# Patient Record
Sex: Female | Born: 1945 | Race: White | Hispanic: No | Marital: Married | Smoking: Never smoker
Health system: Southern US, Community
[De-identification: ages and names within clinical notes are randomized; demographics above are authoritative.]

## PROBLEM LIST (undated history)

## (undated) DIAGNOSIS — J309 Allergic rhinitis, unspecified: Secondary | ICD-10-CM

## (undated) DIAGNOSIS — M5412 Radiculopathy, cervical region: Secondary | ICD-10-CM

## (undated) DIAGNOSIS — E785 Hyperlipidemia, unspecified: Secondary | ICD-10-CM

## (undated) DIAGNOSIS — K219 Gastro-esophageal reflux disease without esophagitis: Secondary | ICD-10-CM

## (undated) DIAGNOSIS — E119 Type 2 diabetes mellitus without complications: Secondary | ICD-10-CM

## (undated) HISTORY — PX: OTHER SURGICAL HISTORY: SHX169

## (undated) HISTORY — DX: Radiculopathy, cervical region: M54.12

## (undated) HISTORY — DX: Allergic rhinitis, unspecified: J30.9

## (undated) HISTORY — PX: TUBAL LIGATION: SHX77

## (undated) HISTORY — DX: Gastro-esophageal reflux disease without esophagitis: K21.9

---

## 1998-03-29 ENCOUNTER — Other Ambulatory Visit: Admission: RE | Admit: 1998-03-29 | Discharge: 1998-03-29 | Payer: Self-pay | Admitting: Obstetrics and Gynecology

## 1999-05-15 ENCOUNTER — Other Ambulatory Visit: Admission: RE | Admit: 1999-05-15 | Discharge: 1999-05-15 | Payer: Self-pay | Admitting: Obstetrics and Gynecology

## 2000-06-12 ENCOUNTER — Encounter: Payer: Self-pay | Admitting: Obstetrics and Gynecology

## 2000-06-12 ENCOUNTER — Encounter: Admission: RE | Admit: 2000-06-12 | Discharge: 2000-06-12 | Payer: Self-pay | Admitting: Obstetrics and Gynecology

## 2000-09-18 ENCOUNTER — Ambulatory Visit (HOSPITAL_COMMUNITY): Admission: RE | Admit: 2000-09-18 | Discharge: 2000-09-18 | Payer: Self-pay | Admitting: Gastroenterology

## 2000-10-28 ENCOUNTER — Encounter (INDEPENDENT_AMBULATORY_CARE_PROVIDER_SITE_OTHER): Payer: Self-pay

## 2000-10-28 ENCOUNTER — Ambulatory Visit (HOSPITAL_COMMUNITY): Admission: RE | Admit: 2000-10-28 | Discharge: 2000-10-28 | Payer: Self-pay | Admitting: Obstetrics and Gynecology

## 2001-08-09 ENCOUNTER — Other Ambulatory Visit: Admission: RE | Admit: 2001-08-09 | Discharge: 2001-08-09 | Payer: Self-pay | Admitting: Obstetrics and Gynecology

## 2001-08-13 ENCOUNTER — Encounter: Payer: Self-pay | Admitting: Obstetrics and Gynecology

## 2001-08-13 ENCOUNTER — Encounter: Admission: RE | Admit: 2001-08-13 | Discharge: 2001-08-13 | Payer: Self-pay | Admitting: Obstetrics and Gynecology

## 2002-05-27 ENCOUNTER — Encounter: Admission: RE | Admit: 2002-05-27 | Discharge: 2002-05-27 | Payer: Self-pay | Admitting: Sports Medicine

## 2003-03-03 ENCOUNTER — Encounter: Admission: RE | Admit: 2003-03-03 | Discharge: 2003-03-03 | Payer: Self-pay | Admitting: Obstetrics and Gynecology

## 2003-03-03 ENCOUNTER — Encounter: Payer: Self-pay | Admitting: Obstetrics and Gynecology

## 2003-05-23 ENCOUNTER — Other Ambulatory Visit: Admission: RE | Admit: 2003-05-23 | Discharge: 2003-05-23 | Payer: Self-pay | Admitting: Obstetrics and Gynecology

## 2003-11-16 ENCOUNTER — Ambulatory Visit (HOSPITAL_COMMUNITY): Admission: RE | Admit: 2003-11-16 | Discharge: 2003-11-16 | Payer: Self-pay | Admitting: Internal Medicine

## 2003-11-28 ENCOUNTER — Encounter: Admission: RE | Admit: 2003-11-28 | Discharge: 2004-02-26 | Payer: Self-pay | Admitting: Neurosurgery

## 2004-08-29 ENCOUNTER — Encounter: Admission: RE | Admit: 2004-08-29 | Discharge: 2004-08-29 | Payer: Self-pay | Admitting: Obstetrics and Gynecology

## 2005-09-02 ENCOUNTER — Encounter: Admission: RE | Admit: 2005-09-02 | Discharge: 2005-09-02 | Payer: Self-pay | Admitting: Obstetrics and Gynecology

## 2005-11-10 ENCOUNTER — Ambulatory Visit: Payer: Self-pay | Admitting: Internal Medicine

## 2006-07-23 ENCOUNTER — Ambulatory Visit: Payer: Self-pay | Admitting: Internal Medicine

## 2006-11-17 ENCOUNTER — Encounter: Admission: RE | Admit: 2006-11-17 | Discharge: 2006-11-17 | Payer: Self-pay | Admitting: Obstetrics and Gynecology

## 2007-04-30 ENCOUNTER — Ambulatory Visit: Payer: Self-pay | Admitting: Internal Medicine

## 2007-04-30 DIAGNOSIS — J069 Acute upper respiratory infection, unspecified: Secondary | ICD-10-CM | POA: Insufficient documentation

## 2007-05-24 ENCOUNTER — Telehealth: Payer: Self-pay | Admitting: Internal Medicine

## 2007-05-31 ENCOUNTER — Ambulatory Visit: Payer: Self-pay | Admitting: Internal Medicine

## 2007-05-31 DIAGNOSIS — J019 Acute sinusitis, unspecified: Secondary | ICD-10-CM

## 2007-05-31 DIAGNOSIS — J309 Allergic rhinitis, unspecified: Secondary | ICD-10-CM

## 2007-05-31 HISTORY — DX: Allergic rhinitis, unspecified: J30.9

## 2007-06-21 ENCOUNTER — Telehealth (INDEPENDENT_AMBULATORY_CARE_PROVIDER_SITE_OTHER): Payer: Self-pay | Admitting: *Deleted

## 2007-06-22 ENCOUNTER — Ambulatory Visit: Payer: Self-pay | Admitting: Internal Medicine

## 2007-06-24 ENCOUNTER — Telehealth: Payer: Self-pay | Admitting: Internal Medicine

## 2007-07-08 ENCOUNTER — Encounter: Payer: Self-pay | Admitting: Internal Medicine

## 2007-07-13 ENCOUNTER — Ambulatory Visit: Payer: Self-pay | Admitting: Internal Medicine

## 2007-07-13 DIAGNOSIS — K219 Gastro-esophageal reflux disease without esophagitis: Secondary | ICD-10-CM

## 2007-07-13 HISTORY — DX: Gastro-esophageal reflux disease without esophagitis: K21.9

## 2007-07-19 ENCOUNTER — Telehealth: Payer: Self-pay | Admitting: Internal Medicine

## 2007-08-13 ENCOUNTER — Encounter: Payer: Self-pay | Admitting: Internal Medicine

## 2007-12-23 ENCOUNTER — Encounter: Admission: RE | Admit: 2007-12-23 | Discharge: 2007-12-23 | Payer: Self-pay | Admitting: Obstetrics and Gynecology

## 2008-12-28 ENCOUNTER — Encounter: Admission: RE | Admit: 2008-12-28 | Discharge: 2008-12-28 | Payer: Self-pay | Admitting: Obstetrics and Gynecology

## 2009-07-31 ENCOUNTER — Ambulatory Visit: Payer: Self-pay | Admitting: Family Medicine

## 2010-01-07 ENCOUNTER — Ambulatory Visit: Payer: Self-pay | Admitting: Internal Medicine

## 2010-01-07 DIAGNOSIS — M5412 Radiculopathy, cervical region: Secondary | ICD-10-CM

## 2010-01-07 HISTORY — DX: Radiculopathy, cervical region: M54.12

## 2010-01-14 ENCOUNTER — Encounter: Admission: RE | Admit: 2010-01-14 | Discharge: 2010-01-14 | Payer: Self-pay | Admitting: Obstetrics and Gynecology

## 2010-05-31 ENCOUNTER — Telehealth: Payer: Self-pay | Admitting: Internal Medicine

## 2010-09-03 NOTE — Progress Notes (Signed)
Summary: cough/congestion  Phone Note Call from Patient Call back at Work Phone [phone]   Caller: Patient Call For: Gordy Savers  MD Summary of Call: Pt head congestion/cough no fever since [facility]day [phone]. Pt decline ov today. Initial call taken by: Heron Sabins,  May 31, 2010 2:48 PM  Follow-up for Phone Call        generic hydromet 6 oz one tsp every 6 hours for cough Follow-up by: Gordy Savers  MD,  May 31, 2010 3:44 PM  Additional Follow-up for Phone Call Additional follow up Details #1::        Phone Call Completed, Rx Called In Additional Follow-up by: Alfred Levins, CMA,  May 31, 2010 4:22 PM

## 2010-09-03 NOTE — Assessment & Plan Note (Signed)
Summary: NECK PAIN (CONCERNED ABOUT HERNIATED DISC) // RS   Vital Signs:  Patient profile:   65 year old female Weight:      175 pounds Temp:     97.9 degrees F oral BP sitting:   108 / 70  (left arm) Cuff size:   regular  Vitals Entered By: Duard Brady LPN (January 08, 2955 9:52 AM) CC: c/o (R) neck and shoulder pain since weekend   no fall no injury Is Patient Diabetic? No   CC:  c/o (R) neck and shoulder pain since weekend   no fall no injury.  History of Present Illness: 65 year old patient who has a prior history of a right C7 cervical radiculopathy.  Detailed medical records are unavailable, but she was too with a single epidural at [facility] and has done well over the past 7 years;  for the past several days.  She has had increasing right neck, shoulder discomfort.  She feels this is identical to her symptoms in the past.  She has already  self-referred to a [facility] neurologist, and would like to consider epidural injections at this time.  There is been no motor weakness  Preventive Screening-Counseling & Management  Alcohol-Tobacco     Smoking Status: never  Allergies: 1)  ! Erythromycin (Erythromycin)  Past History:  Past Medical History: Reviewed history from 05/31/2007 and no changes required. Allergic rhinitis history acute right C7 cervical radiculopathy gravida two, para two,   Social History: Smoking Status:  never  Physical Exam  General:  Well-developed,well-nourished,in no acute distress; alert,appropriate and cooperative throughout examination Eyes:  No corneal or conjunctival inflammation noted. EOMI. Perrla. Funduscopic exam benign, without hemorrhages, exudates or papilledema. Vision grossly normal. Neck:  full range of motion of the neck and head;  turning to the right did aggravate her right shoulder and neck discomfort Neurologic:  alert & oriented X3, cranial nerves II-XII intact, and strength normal in all extremities.   biceps and  triceps reflexes were brisk and equal no motor weakness   Impression & Recommendations:  Problem # 1:  CERVICAL RADICULOPATHY, RIGHT (ICD-723.4)  Complete Medication List: 1)  Omeprazole 40 Mg Cpdr (Omeprazole) .Marland Kitchen.. 1 two times a day 2)  Mupirocin 2 % Oint (Mupirocin) .... Apply to nose as needed  Patient Instructions: 1)  follow-up [facility] neurology   This afternoon as scheduled 2)  call if unimproved in   Medication Administration  Injection # 1:    Medication: Depo- Medrol 80mg   Orders Added: 1)  Est. Patient Level III [21308]   Appended Document: NECK PAIN (CONCERNED ABOUT HERNIATED DISC) // RS     Allergies: 1)  ! Erythromycin (Erythromycin)   Complete Medication List: 1)  Omeprazole 40 Mg Cpdr (Omeprazole) .Marland Kitchen.. 1 two times a day 2)  Mupirocin 2 % Oint (Mupirocin) .... Apply to nose as needed  Other Orders: Depo- Medrol 80mg  (J1040) Admin of Therapeutic Inj  intramuscular or subcutaneous (65784)    Medication Administration  Injection # 1:    Medication: Depo- Medrol 80mg     Diagnosis: CERVICAL RADICULOPATHY, RIGHT (ICD-723.4)    Route: IM    Site: R deltoid    Exp Date: 06/2012    Lot #: [id number]    Mfr: Pharmacia    Patient tolerated injection without complications    Given by: Duard Brady LPN (January 07, 6961 2:39 PM)  Orders Added: 1)  Depo- Medrol 80mg  [J1040] 2)  Admin of Therapeutic Inj  intramuscular or  subcutaneous [id number]

## 2010-10-17 ENCOUNTER — Encounter: Payer: Self-pay | Admitting: Internal Medicine

## 2010-10-17 ENCOUNTER — Telehealth: Payer: Self-pay | Admitting: Internal Medicine

## 2010-10-17 NOTE — Telephone Encounter (Signed)
Pt called req work in appt for tomorrow 10/18/10, anytime, re: chest cold and sorethroat. Pt leaving to go to [place] this wkend. Pls advise.

## 2010-10-17 NOTE — Telephone Encounter (Signed)
Please make appt - I see 2 open appt tomorrow afternoon.

## 2010-10-17 NOTE — Telephone Encounter (Addendum)
Pt will come in around 230pm on 3-16

## 2010-10-18 ENCOUNTER — Ambulatory Visit (INDEPENDENT_AMBULATORY_CARE_PROVIDER_SITE_OTHER): Payer: BC Managed Care – PPO | Admitting: Internal Medicine

## 2010-10-18 ENCOUNTER — Encounter: Payer: Self-pay | Admitting: Internal Medicine

## 2010-10-18 DIAGNOSIS — J029 Acute pharyngitis, unspecified: Secondary | ICD-10-CM

## 2010-10-18 LAB — POCT RAPID STREP A (OFFICE): Rapid Strep A Screen: NEGATIVE

## 2010-10-18 NOTE — Patient Instructions (Signed)
Get plenty of rest, Drink lots of  clear liquids, and use Tylenol or ibuprofen for fever and discomfort.    Call or return to clinic prn if these symptoms worsen or fail to improve as anticipated.  

## 2010-10-18 NOTE — Progress Notes (Signed)
  Subjective:    Patient ID: Kathy Herman, female    DOB: 11/28/1945, 65 y.o.   MRN: [id number]  HPI   65 year old patient who presents with a three-day history of nasal congestion cough and sore throat. She'll be leaving the country in 2 days and was concerned about a possible strep. She also has had some mild hoarseness. A rapid strep screen negative. She was afebrile today.    Review of Systems  Constitutional: Positive for fever.  HENT: Negative for hearing loss, congestion, sore throat, rhinorrhea, dental problem, sinus pressure and tinnitus.   Eyes: Negative for pain, discharge and visual disturbance.  Respiratory: Positive for cough. Negative for shortness of breath.   Cardiovascular: Negative for chest pain, palpitations and leg swelling.  Gastrointestinal: Negative for nausea, vomiting, abdominal pain, diarrhea, constipation, blood in stool and abdominal distention.  Genitourinary: Negative for dysuria, urgency, frequency, hematuria, flank pain, vaginal bleeding, vaginal discharge, difficulty urinating, vaginal pain and pelvic pain.  Musculoskeletal: Negative for joint swelling, arthralgias and gait problem.  Skin: Negative for rash.  Neurological: Negative for dizziness, syncope, speech difficulty, weakness, numbness and headaches.  Hematological: Negative for adenopathy.  Psychiatric/Behavioral: Negative for behavioral problems, dysphoric mood and agitation. The patient is not nervous/anxious.        Objective:   Physical Exam  Constitutional: She is oriented to person, place, and time. She appears well-developed and well-nourished.  HENT:  Head: Normocephalic.  Right Ear: External ear normal.  Left Ear: External ear normal.        Mild erythema of the oropharynx  Eyes: Conjunctivae and EOM are normal. Pupils are equal, round, and reactive to light.  Neck: Normal range of motion. Neck supple. No thyromegaly present.  Cardiovascular: Normal rate, regular rhythm, normal  heart sounds and intact distal pulses.   Pulmonary/Chest: Effort normal and breath sounds normal.  Abdominal: Soft. Bowel sounds are normal. She exhibits no mass. There is no tenderness.  Musculoskeletal: Normal range of motion.  Lymphadenopathy:    She has no cervical adenopathy.  Neurological: She is alert and oriented to person, place, and time.  Skin: Skin is warm and dry. No rash noted.  Psychiatric: She has a normal mood and affect. Her behavior is normal.          Assessment & Plan:   viral URI with pharyngitis. We'll treat symptomatically.

## 2010-11-01 ENCOUNTER — Encounter: Payer: Self-pay | Admitting: Internal Medicine

## 2010-11-01 ENCOUNTER — Ambulatory Visit (INDEPENDENT_AMBULATORY_CARE_PROVIDER_SITE_OTHER): Payer: BC Managed Care – PPO | Admitting: Internal Medicine

## 2010-11-01 VITALS — Temp 98.1°F | Wt 176.0 lb

## 2010-11-01 DIAGNOSIS — K219 Gastro-esophageal reflux disease without esophagitis: Secondary | ICD-10-CM

## 2010-11-01 DIAGNOSIS — H109 Unspecified conjunctivitis: Secondary | ICD-10-CM

## 2010-11-01 MED ORDER — NEOMYCIN-POLYMYXIN-HC 3.5-10000-1 OP SUSP: 2.0000 [drp] | Freq: Four times a day (QID) | OPHTHALMIC | Status: AC

## 2010-11-01 NOTE — Patient Instructions (Signed)
Ophthalmic drops as discussed  Call or return to clinic prn if these symptoms worsen or fail to improve as anticipated.

## 2010-11-01 NOTE — Progress Notes (Signed)
  Subjective:    Patient ID: Kathy Herman, female    DOB: 11/04/1945, 65 y.o.   MRN: [id number]  HPI 65 year old patient who presents with a seven-day history of red itchy eyes. She has considerable matting of the eyelids when she awakes in the morning. No visual loss or eye pain. She has returned from a trip to [place] where she has suffered for most of the trip with significant URI symptoms. These have largely resolved. She has a history of gastroesophageal reflux disease and has recently had reevaluation at [facility]    Review of Systems  Constitutional: Positive for fatigue. Negative for fever.  HENT: Negative for hearing loss, congestion, sore throat, rhinorrhea, dental problem, sinus pressure and tinnitus.   Eyes: Positive for discharge, redness and itching. Negative for pain and visual disturbance.  Respiratory: Negative for cough and shortness of breath.   Cardiovascular: Negative for chest pain, palpitations and leg swelling.  Gastrointestinal: Negative for nausea, vomiting, abdominal pain, diarrhea, constipation, blood in stool and abdominal distention.  Genitourinary: Negative for dysuria, urgency, frequency, hematuria, flank pain, vaginal bleeding, vaginal discharge, difficulty urinating, vaginal pain and pelvic pain.  Musculoskeletal: Negative for joint swelling, arthralgias and gait problem.  Skin: Negative for rash.  Neurological: Negative for dizziness, syncope, speech difficulty, weakness, numbness and headaches.  Hematological: Negative for adenopathy.  Psychiatric/Behavioral: Negative for behavioral problems, dysphoric mood and agitation. The patient is not nervous/anxious.        Objective:   Physical Exam  Constitutional: She appears well-developed and well-nourished. No distress.  HENT:  Head: Normocephalic and atraumatic.  Mouth/Throat: Oropharynx is clear and moist.  Eyes: EOM are normal. Pupils are equal, round, and reactive to light. Right eye exhibits  no discharge.       Both eyes revealed mild conjunctival injection. Lids were normal no exudate noted  Neck: Normal range of motion. Neck supple.  Cardiovascular: Normal rate and regular rhythm.   Pulmonary/Chest: Effort normal and breath sounds normal.          Assessment & Plan:  Conjunctivitis. We'll treat with Cortisporin Ophthalmic solution. She will call or she fails to improve promptly

## 2010-12-20 NOTE — Assessment & Plan Note (Signed)
[facility]                                 ON-CALL NOTE   Kathy Herman, Kathy Herman                       MRN:          [id number]  DATE:07/17/2007                            DOB:          June 23, 1946    A patient of Dr. Amador Cunas.   The patient calling because she thinks she has a sinus infection.  Offered Saturday clinic eval.  Patient declines, wants to see Dr.  Amador Cunas on Monday.     Jeffrey A. Tawanna Cooler, MD  Electronically Signed    JAT/MedQ  DD: 07/17/2007  DT: 07/19/2007  Job #: [id number]

## 2010-12-20 NOTE — Procedures (Signed)
[facility]  Patient:    [name], Kathy Herman                     MRN: [id number] Proc. Date: 09/18/00 Adm. Date:  [id number] Attending:  Charna Elizabeth CC:         Eliberto Ivory. Rosalio Macadamia, M.D., [facility]   Procedure Report  DATE OF BIRTH:  09/30/1945.  PROCEDURE:  Colonoscopy.  ENDOSCOPIST:  Anselmo Rod, M.D.  INSTRUMENT USED:  Olympus video colonoscope.  INDICATION FOR PROCEDURE:  Rectal bleeding in a 65 year old white female. Rule out colonic polyps, masses, hemorrhoids, etc.  PREPROCEDURE PREPARATION:  Informed consent was procured from the patient. The patient was fasted for eight hours prior to the procedure and prepped with a bottle of magnesium citrate and a gallon of NuLytely the night prior to the procedure.  PREPROCEDURE PHYSICAL:  VITAL SIGNS:  The patient had stable vital signs.  NECK:  Supple.  CHEST:  Clear to auscultation.  S1, S2 regular.  ABDOMEN:  Soft with normal abdominal bowel sounds.  DESCRIPTION OF PROCEDURE:  The patient was placed in the left lateral decubitus position and sedated with 70 mg of Demerol and 7 mg of Versed intravenously.  Once the patient was adequately sedate and maintained on low-flow oxygen and continuous cardiac monitoring, the Olympus video colonoscope was advanced from the rectum to the cecum without difficulty. There were a few scattered diverticula seen throughout the colon.  The patient had a small external hemorrhoid and small, nonbleeding internal hemorrhoid. No masses or polyps were seen.  IMPRESSION: 1. Scattered diverticulosis. 2. Small internal and external hemorrhoid. 3. No large masses or polyps seen.  RECOMMENDATIONS: 1. Proceed with gynecological evaluation as planned by Dr. Floyde Parkins. 2. Increase the fluid and fiber in the diet. 3. Outpatient follow-up on a p.r.n. basis. DD:  09/18/00 TD:  09/19/00 Job: [id number]

## 2010-12-20 NOTE — Op Note (Signed)
[facility]  Patient:    Kathy Herman, Kathy Herman                     MRN: [id number] Proc. Date: 10/28/00 Adm. Date:  [id number] Attending:  Morene Antu                           Operative Report  PREOPERATIVE DIAGNOSES:       Postmenopausal bleeding, with thickened endometrium.  POSTOPERATIVE DIAGNOSES:      Postmenopausal bleeding, with thickened endometrium.  PROCEDURE:                    D&C hysteroscopy, with resectoscopic excision of polypoid and thickened tissue.  SURGEON:                      Sherry A. Rosalio Macadamia, M.D.  ANESTHESIA:                   MAC.  INDICATIONS:                  This is a 65 year old G3, P2-0-1-2 woman who has had postmenopausal bleeding.  The patient had been treated with Ortho-Est for postmenopausal symptoms.  Endometrial biopsy had been attempted in the office, but the cervical os was stenotic.  Ultrasound was performed showing thickened endometrium, approximately 1.6 cm.  Because of this, The patient is brought to the operating room for [facility] hysteroscopy with resectoscope.  FINDINGS:                     Very thickened, polypoid tissue.  Normal-sized anteflexed uterus.  No adnexal mass.  DESCRIPTION OF PROCEDURE:     The patient is brought into the operating room and given adequate IV sedation.  She was placed in the dorsal lithotomy position.  Her perineum was washed with Betadine.  Pelvic examination was performed.  The bladder was in and out catheterized.  The surgeons gown was changed.  The patient was draped in a sterile fashion.  A speculum was placed within the vagina.  The vagina was washed with Betadine.  Paracervical block was administered with 1% Nesacaine.  The anterior lip of the cervix was grasped with a single-tooth tenaculum.  The cervix was sounded.  The cervix was dilated with Pratt dilators to a #31.  Hysteroscope was then introduced into the endometrial cavity without difficulty and  pictures were obtained. Using a double-looped right angle resector, the endometrial tissue was resected in sheets circumferentially.  There were small bleeders present that were cauterized.  Adequate hemostasis was obtained.  Pictures were obtained. All instruments were removed from the vagina.  The patient was taken out of the dorsal lithotomy position.  She was awakened, removed from the operating table to a stretcher in stable condition.  COMPLICATIONS:                None.  ESTIMATED BLOOD LOSS:         Less than 5 cc.  Sorbitol differential -80. DD:  10/28/00 TD:  10/28/00 Job: [id number]

## 2011-01-06 ENCOUNTER — Encounter: Payer: Self-pay | Admitting: Internal Medicine

## 2011-03-03 ENCOUNTER — Other Ambulatory Visit: Payer: Self-pay | Admitting: Internal Medicine

## 2011-03-03 DIAGNOSIS — Z1231 Encounter for screening mammogram for malignant neoplasm of breast: Secondary | ICD-10-CM

## 2011-03-31 ENCOUNTER — Ambulatory Visit
Admission: RE | Admit: 2011-03-31 | Discharge: 2011-03-31 | Disposition: A | Discharge status: Home / Self Care | Payer: BC Managed Care – PPO | Source: Ambulatory Visit | Attending: Internal Medicine | Admitting: Internal Medicine

## 2011-03-31 DIAGNOSIS — Z1231 Encounter for screening mammogram for malignant neoplasm of breast: Secondary | ICD-10-CM

## 2011-07-22 ENCOUNTER — Ambulatory Visit (INDEPENDENT_AMBULATORY_CARE_PROVIDER_SITE_OTHER): Payer: BC Managed Care – PPO | Admitting: Internal Medicine

## 2011-07-22 ENCOUNTER — Encounter: Payer: Self-pay | Admitting: Internal Medicine

## 2011-07-22 VITALS — BP 120/74 | Temp 97.0°F | Wt 179.0 lb

## 2011-07-22 DIAGNOSIS — M542 Cervicalgia: Secondary | ICD-10-CM

## 2011-07-22 DIAGNOSIS — M5412 Radiculopathy, cervical region: Secondary | ICD-10-CM

## 2011-07-22 MED ORDER — TRAMADOL HCL 50 MG PO TABS: 50.0000 mg | ORAL_TABLET | Freq: Three times a day (TID) | ORAL | Status: AC | PRN

## 2011-07-22 MED ORDER — CYCLOBENZAPRINE HCL 10 MG PO TABS: 10.0000 mg | ORAL_TABLET | Freq: Three times a day (TID) | ORAL | Status: AC | PRN

## 2011-07-22 NOTE — Patient Instructions (Signed)
You  may move around, but avoid painful motions and activities.  Apply  Heat  to the sore area for 15 to 20 minutes 3 or 4 times daily for the next two to 3 days.  Take 400-600 mg of ibuprofen ( Advil, Motrin) with food every 4 to 6 hours as needed for pain relief or control of fever  Call or return to clinic prn if these symptoms worsen or fail to improve as anticipated.

## 2011-07-22 NOTE — Progress Notes (Signed)
  Subjective:    Patient ID: Kathy Herman, female    DOB: Jun 12, 1946, 65 y.o.   MRN: [id number]  HPI 65 year old patient who has a history of a prior right cervical radiculopathy in 2008. For the past 3 days she has had left posterior neck pain. She has had no radicular symptoms. Pain is aggravated by head movement to the left.   Review of Systems     Objective:   Physical Exam  Constitutional: She appears well-developed and well-nourished. No distress.  Neck:       The right posterior neck musculature tight and tense. Pain is aggravated by head movement to the left          Assessment & Plan:   Musculoligamentous neck pain Cervical disc disease  We'll treat with Depo-Medrol 80 mg IM we'll treat with analgesics anti-inflammatories and muscle relaxers. Will  rest and apply warm compresses

## 2011-08-18 ENCOUNTER — Telehealth: Payer: Self-pay | Admitting: *Deleted

## 2011-08-18 DIAGNOSIS — M542 Cervicalgia: Secondary | ICD-10-CM

## 2011-08-18 NOTE — Telephone Encounter (Signed)
LMTCB

## 2011-08-18 NOTE — Telephone Encounter (Signed)
Please call pt for dates she is available for this CT.  [phone]   CERVICAL CT

## 2011-08-18 NOTE — Telephone Encounter (Signed)
Pt's neck and back pain is getting increasingly worse, and is asking if she should see Dr. Kirtland Bouchard or get a referral to an Orthopedist.

## 2011-08-18 NOTE — Telephone Encounter (Signed)
Schedule cervical MRI

## 2011-08-19 ENCOUNTER — Other Ambulatory Visit: Payer: Self-pay | Admitting: *Deleted

## 2011-08-19 DIAGNOSIS — M542 Cervicalgia: Secondary | ICD-10-CM

## 2011-08-21 ENCOUNTER — Ambulatory Visit
Admission: RE | Admit: 2011-08-21 | Discharge: 2011-08-21 | Disposition: A | Discharge status: Home / Self Care | Payer: BC Managed Care – PPO | Source: Ambulatory Visit | Attending: Internal Medicine | Admitting: Internal Medicine

## 2011-08-21 DIAGNOSIS — M542 Cervicalgia: Secondary | ICD-10-CM

## 2011-08-22 ENCOUNTER — Other Ambulatory Visit: Payer: Self-pay

## 2011-08-22 ENCOUNTER — Encounter: Payer: Self-pay | Admitting: Internal Medicine

## 2011-08-22 ENCOUNTER — Other Ambulatory Visit: Payer: Self-pay | Admitting: Internal Medicine

## 2011-08-22 ENCOUNTER — Telehealth: Payer: Self-pay

## 2011-08-22 MED ORDER — PREDNISONE (PAK) 5 MG PO TABS: 5.0000 mg | ORAL_TABLET | Freq: Every day | ORAL | Status: DC

## 2011-08-22 NOTE — Progress Notes (Signed)
Quick Note:  Called pt- VM - LMTCB , I need pharmacy to call out prednisone to. ______

## 2011-08-22 NOTE — Progress Notes (Signed)
Quick Note:  [facility]

## 2011-08-22 NOTE — Telephone Encounter (Signed)
Attempt to call- VM - on cell # - LMTCB need pharmacy to be able to call out prednisone. Please call back and give this info.

## 2011-08-27 ENCOUNTER — Telehealth: Payer: Self-pay | Admitting: *Deleted

## 2011-08-27 NOTE — Telephone Encounter (Signed)
Called dr' lauve's office and this is a follow up with this md for neck issues-----fyi--holly faxed mri

## 2011-08-27 NOTE — Telephone Encounter (Signed)
Received call from dr lauve's office in [place], Neurologist-- states they need mri  -pt has ov with them tomorrow, Thursday-  I cant find any reference to a referral for this pt. This is neurologist , not nuerosurgeon.  Is it ok to fax mri and last ov?? If so fax number is [phone]

## 2011-10-08 ENCOUNTER — Other Ambulatory Visit: Payer: Self-pay | Admitting: Obstetrics and Gynecology

## 2011-10-08 DIAGNOSIS — M949 Disorder of cartilage, unspecified: Secondary | ICD-10-CM

## 2011-10-20 ENCOUNTER — Ambulatory Visit
Admission: RE | Admit: 2011-10-20 | Discharge: 2011-10-20 | Disposition: A | Discharge status: Home / Self Care | Payer: BC Managed Care – PPO | Source: Ambulatory Visit | Attending: Obstetrics and Gynecology | Admitting: Obstetrics and Gynecology

## 2011-10-20 DIAGNOSIS — M899 Disorder of bone, unspecified: Secondary | ICD-10-CM

## 2012-06-23 ENCOUNTER — Other Ambulatory Visit: Payer: Self-pay | Admitting: Internal Medicine

## 2012-06-23 DIAGNOSIS — Z1231 Encounter for screening mammogram for malignant neoplasm of breast: Secondary | ICD-10-CM

## 2012-07-15 ENCOUNTER — Ambulatory Visit
Admission: RE | Admit: 2012-07-15 | Discharge: 2012-07-15 | Disposition: A | Discharge status: Home / Self Care | Payer: BC Managed Care – PPO | Source: Ambulatory Visit | Attending: Internal Medicine | Admitting: Internal Medicine

## 2012-07-15 DIAGNOSIS — Z1231 Encounter for screening mammogram for malignant neoplasm of breast: Secondary | ICD-10-CM

## 2012-08-12 ENCOUNTER — Other Ambulatory Visit: Payer: Self-pay | Admitting: Internal Medicine

## 2012-08-12 ENCOUNTER — Ambulatory Visit: Admission: RE | Admit: 2012-08-12 | Payer: BC Managed Care – PPO | Source: Ambulatory Visit

## 2012-08-12 ENCOUNTER — Ambulatory Visit
Admission: RE | Admit: 2012-08-12 | Discharge: 2012-08-12 | Disposition: A | Discharge status: Home / Self Care | Payer: BC Managed Care – PPO | Source: Ambulatory Visit

## 2012-08-12 DIAGNOSIS — Z1231 Encounter for screening mammogram for malignant neoplasm of breast: Secondary | ICD-10-CM

## 2012-11-19 ENCOUNTER — Emergency Department (HOSPITAL_COMMUNITY): Payer: BC Managed Care – PPO

## 2012-11-19 ENCOUNTER — Emergency Department (HOSPITAL_COMMUNITY)
Admission: EM | Admit: 2012-11-19 | Discharge: 2012-11-19 | Disposition: A | Discharge status: Home / Self Care | Payer: BC Managed Care – PPO | Attending: Emergency Medicine | Admitting: Emergency Medicine

## 2012-11-19 ENCOUNTER — Encounter (HOSPITAL_COMMUNITY): Payer: Self-pay | Admitting: Emergency Medicine

## 2012-11-19 DIAGNOSIS — Z8739 Personal history of other diseases of the musculoskeletal system and connective tissue: Secondary | ICD-10-CM | POA: Insufficient documentation

## 2012-11-19 DIAGNOSIS — S63279A Dislocation of unspecified interphalangeal joint of unspecified finger, initial encounter: Secondary | ICD-10-CM | POA: Insufficient documentation

## 2012-11-19 DIAGNOSIS — Z8709 Personal history of other diseases of the respiratory system: Secondary | ICD-10-CM | POA: Insufficient documentation

## 2012-11-19 DIAGNOSIS — Y9301 Activity, walking, marching and hiking: Secondary | ICD-10-CM | POA: Insufficient documentation

## 2012-11-19 DIAGNOSIS — W108XXA Fall (on) (from) other stairs and steps, initial encounter: Secondary | ICD-10-CM | POA: Insufficient documentation

## 2012-11-19 DIAGNOSIS — K219 Gastro-esophageal reflux disease without esophagitis: Secondary | ICD-10-CM | POA: Insufficient documentation

## 2012-11-19 DIAGNOSIS — Z79899 Other long term (current) drug therapy: Secondary | ICD-10-CM | POA: Insufficient documentation

## 2012-11-19 DIAGNOSIS — IMO0002 Reserved for concepts with insufficient information to code with codable children: Secondary | ICD-10-CM | POA: Insufficient documentation

## 2012-11-19 DIAGNOSIS — S63259A Unspecified dislocation of unspecified finger, initial encounter: Secondary | ICD-10-CM

## 2012-11-19 DIAGNOSIS — Y92009 Unspecified place in unspecified non-institutional (private) residence as the place of occurrence of the external cause: Secondary | ICD-10-CM | POA: Insufficient documentation

## 2012-11-19 DIAGNOSIS — W010XXA Fall on same level from slipping, tripping and stumbling without subsequent striking against object, initial encounter: Secondary | ICD-10-CM | POA: Insufficient documentation

## 2012-11-19 MED ORDER — HYDROCODONE-ACETAMINOPHEN 5-325 MG PO TABS: 1.0000 | ORAL_TABLET | ORAL | Status: DC | PRN

## 2012-11-19 MED ORDER — LIDOCAINE HCL 2 % IJ SOLN
INTRAMUSCULAR | Status: AC
  Filled 2012-11-19: qty 20

## 2012-11-19 NOTE — ED Notes (Signed)
Patient walking up stairs fell and caught herself with her right hand injury 5th finger.  Obvious deformity to finger.  Bruising present pt reports 5/10 pain level.  EMS gave patient Fentanyl in route.  Pain also reporting pain in right rib area.  No bruising noted.

## 2012-11-19 NOTE — ED Provider Notes (Signed)
History     CSN: [id number]  Arrival date & time 11/19/12  1630   First MD Initiated Contact with Patient 11/19/12 1651      Chief Complaint  Patient presents with  . Finger Injury    (Consider location/radiation/quality/duration/timing/severity/associated sxs/prior treatment) HPI Patient tripped and fell walking up the steps in her home one hour ago injuring her right fifth finger. She also bumped her right ribs as a result fall. No other injury pain is worse with movement a finger or with palpation. Pain at ribs is nonradiating lateral aspect worse with pressing on the area no shortness of breath no other associated symptoms no numbness no other injury EMS treated patient with fentanyl 50 mg IV with partial relief of pain. Past Medical History  Diagnosis Date  . Allergic rhinitis, cause unspecified 05/31/2007  . CERVICAL RADICULOPATHY, RIGHT 01/07/2010  . GERD 07/13/2007    Past Surgical History  Procedure Laterality Date  . Tubal ligation      No family history on file.  History  Substance Use Topics  . Smoking status: Never Smoker   . Smokeless tobacco: Never Used  . Alcohol Use: Not on file    OB History   Grav Para Term Preterm Abortions TAB SAB Ect Mult Living                  Review of Systems  Constitutional: Negative.   HENT: Negative.   Respiratory: Negative.        Right rib pain  Cardiovascular: Negative.   Gastrointestinal: Negative.   Musculoskeletal: Positive for arthralgias.       Injury right fifth finger  Skin: Negative.   Neurological: Negative.   Psychiatric/Behavioral: Negative.     Allergies  Erythromycin  Home Medications   Current Outpatient Rx  Name  Route  Sig  Dispense  Refill  . esomeprazole (NEXIUM) 40 MG capsule   Oral   Take 40 mg by mouth 2 (two) times daily.           . predniSONE (STERAPRED UNI-PAK) 5 MG TABS   Oral   Take 1 tablet (5 mg total) by mouth daily.   12 tablet   0   . ranitidine (ZANTAC) 150 MG  tablet   Oral   Take 300 mg by mouth at bedtime.           . valACYclovir (VALTREX) 1000 MG tablet                 BP 109/65  Pulse 68  Temp(Src) 97.9 F (36.6 C) (Oral)  Resp 16  SpO2 94%  Physical Exam  Nursing note and vitals reviewed. Constitutional: She appears well-developed and well-nourished.  HENT:  Head: Normocephalic and atraumatic.  Eyes: Conjunctivae are normal. Pupils are equal, round, and reactive to light.  Neck: Neck supple. No tracheal deviation present. No thyromegaly present.  Cardiovascular: Normal rate and regular rhythm.   No murmur heard. Pulmonary/Chest: Effort normal and breath sounds normal. She exhibits tenderness.  Minimally tender at right lateral upper rib cage no crepitance no flail  Abdominal: Soft. Bowel sounds are normal. She exhibits no distension. There is no tenderness.  Musculoskeletal: Normal range of motion. She exhibits no edema and no tenderness.  Right upper extremity skin intact no obvious deformity at the fifth finger at PIP joint. Sensation intact to light touch. Good capillary refill Otherwise atraumatic. All other extremities the contusion abrasion or tenderness neurovascularly intact  Neurological: She is alert. Coordination  normal.  Skin: Skin is warm and dry. No rash noted.  Psychiatric: She has a normal mood and affect.    ED Course  Procedures (including critical care time)  Labs Reviewed - No data to display Dg Hand Complete Right  11/19/2012  *RADIOLOGY REPORT*  Clinical Data:  Right hand pain post fall, pain greatest at little finger  RIGHT HAND - COMPLETE 3+ VIEW  Comparison: None  Findings: Mild osseous demineralization. PIP dislocation right little finger, distal phalanx dislocated ulnar and dorsal. No definite fracture identified. No additional acute bony abnormalities identified.  IMPRESSION: PIP dislocation right little finger.   Original Report Authenticated By: Ulyses Southward, M.D.    Dg Finger Little  Right  11/19/2012  *RADIOLOGY REPORT*  Clinical Data:  Right little finger pain post fall  RIGHT LITTLE FINGER 2+V  Comparison: None  Findings: PIP dislocation right little finger, middle phalanx dislocated ulnar and slightly dorsal. No fracture, additional dislocation or bone destruction. Bones appear diffusely demineralized.  IMPRESSION: PIP dislocation right little finger.   Original Report Authenticated By: Ulyses Southward, M.D.      No diagnosis found.  Procedure 6 PM right fifth finger dislocation was reduced by me. Timeout was performed . A digital block was performed with 2% lidocaine finger was reduced with direct traction and splinted by me with the aid of an orthopedic technician. There was an excellent result the patient tolerated the procedure well Patient is ASA category 2, n.p.o. status 5 hours all x-rays reviewed by me MDM  Case discussed with Dr Merlyn Lot plan prescription Norco call office 4/ 21/ 2014 for followup Diagnosis #1 fall #2 dislocated right fifth finger #3 contusion to right chest wall        Doug Sou, MD 11/19/12 [phone]

## 2012-11-19 NOTE — ED Notes (Signed)
ZOX:[id number]<AV> Expected date:<BR> Expected time:<BR> Means of arrival:<BR> Comments:<BR> Fall-arm deformity

## 2013-01-14 ENCOUNTER — Ambulatory Visit
Admission: RE | Admit: 2013-01-14 | Discharge: 2013-01-14 | Disposition: A | Discharge status: Home / Self Care | Payer: BC Managed Care – PPO | Source: Ambulatory Visit | Attending: Family Medicine | Admitting: Family Medicine

## 2013-01-14 ENCOUNTER — Other Ambulatory Visit: Payer: Self-pay | Admitting: Family Medicine

## 2013-01-14 ENCOUNTER — Ambulatory Visit (INDEPENDENT_AMBULATORY_CARE_PROVIDER_SITE_OTHER): Payer: BC Managed Care – PPO | Admitting: Family Medicine

## 2013-01-14 VITALS — BP 132/81 | Ht 64.0 in | Wt 170.0 lb

## 2013-01-14 DIAGNOSIS — M25552 Pain in left hip: Secondary | ICD-10-CM

## 2013-01-14 DIAGNOSIS — M25551 Pain in right hip: Secondary | ICD-10-CM

## 2013-01-14 DIAGNOSIS — M25559 Pain in unspecified hip: Secondary | ICD-10-CM

## 2013-01-14 MED ORDER — TRAMADOL HCL 50 MG PO TABS: ORAL_TABLET | ORAL | Status: DC

## 2013-01-14 NOTE — Progress Notes (Signed)
  Subjective:    Patient ID: Kathy Herman, female    DOB: October 08, 1945, 67 y.o.   MRN: [id number]  HPI Bilateral but left greater than right hip pain since fall 9 weeks ago. Was running up the stairs when she tripped fell forward dislocated the fourth and fifth fingers on her right hand. No loss of consciousness. Has some mild shoulder pain that has resolved. Did not notice initially that she was having left hip pain because her hand injury was so severe. She is seeing hand surgery for that.  Hip pain is mostly in the left hip, with weightbearing, stairclimbing. She has tried various stretching, exercises such as rolling which does not bother it and massage. Massage caused the pain actually been much worse. She used to walk 45 minutes today was having difficulty returning to that activity secondary to left hip pain. The right hip has some pain when she sits a certain way but is just not as bad as the left. No numbness in her left lower extremity but she does feel like she limps pain is waking her up at night, causing difficulty falling asleep as well.  PERTINENT  PMH / PSH: No prior hip injury or hip surgery, no back surgery. Never smoker Post menopausal but no history of known osteoporosis   DEXXA 10/2011:AP LUMBAR SPINE (L1 - L4)  Bone Mineral Density (BMD): 0.859 g/cm2  Young Adult T Score: -1.7  Z Score: 0.1  LEFT FEMUR (NECK)  Bone Mineral Density (BMD): 0.766 g/cm2  Young Adult T Score: -0.8  Z Score: 0.8  Vicodin caused nausea so she would prefer not to use that in the future.  Review of Systems No incontinence bowel or bladder. See history of present illness.    Objective:   Physical Exam  Vital signs reviewed. GENERAL: Well developed, well nourished, no acute distress HIPS: Right hip internal/external rotation is full and painless. Left hip internal or sore rotation is essentially full although she has pain at the midway point of internal rotation and at the for recheck of  range on external rotation. Hip flexor strength is normal. Compression axially cause increased pain. Significant pain with hop test. GAIT: shortened swing phase left (antalgic)      Assessment & Plan:

## 2013-01-14 NOTE — Assessment & Plan Note (Signed)
Suspicion for stress fracture or labral injury in the left hip. I think the right hip pain is compensatory. We'll get bilateral hip/pelvis x-rays. This is negative I think we need MR arthrogram of the left hip. If her small amount of tramadol.

## 2013-01-18 ENCOUNTER — Telehealth: Payer: Self-pay | Admitting: Family Medicine

## 2013-01-18 ENCOUNTER — Telehealth: Payer: Self-pay | Admitting: *Deleted

## 2013-01-18 DIAGNOSIS — M25552 Pain in left hip: Secondary | ICD-10-CM

## 2013-01-18 NOTE — Telephone Encounter (Signed)
Pt called wanting xray results of left leg, states she called yesterday and no one has called her yet. Thanks!

## 2013-01-18 NOTE — Telephone Encounter (Signed)
Scheduled pt for MRI 01/19/13 at 9 am at [facility] [address] pt notified of appt info.

## 2013-01-18 NOTE — Telephone Encounter (Signed)
Spoke w pt--reviewed films.She had improved over weekend and then yesterday had return of symptoms. We discussed and she owuld like to proceedwith MRI of hip Kathy Herman

## 2013-01-19 ENCOUNTER — Ambulatory Visit
Admission: RE | Admit: 2013-01-19 | Discharge: 2013-01-19 | Disposition: A | Discharge status: Home / Self Care | Payer: BC Managed Care – PPO | Source: Ambulatory Visit | Attending: Family Medicine | Admitting: Family Medicine

## 2013-01-19 ENCOUNTER — Telehealth: Payer: Self-pay | Admitting: Family Medicine

## 2013-01-19 DIAGNOSIS — M25552 Pain in left hip: Secondary | ICD-10-CM

## 2013-01-19 DIAGNOSIS — M25551 Pain in right hip: Secondary | ICD-10-CM

## 2013-01-19 NOTE — Telephone Encounter (Signed)
Kathy Herman Please call her---I will call her if you think I need to but I am slammed and it will likely be much later today or tomorrow--tell her NO HIP FRACTURE---she does have a pretty significant MUSCLE TEAR in the left HIP FLEXORS. This is a big muscle and totally m,akes sense with her symptoms and her fall mechanism.  My recommendation would be to either do home exercise program and take pain medicine---likely would be 6-8 weeks of home exercise or what I woul drelly like is physical therapy with some e stim and additionally home exercise medicine. It is going to continue to hurt (esp during rehab) for next 4-8 weeks so if she needs more pain meds I can do that.  Let me know if I need to call her. I would see her back in f/u ion 4 weeks THANKS! Denny Levy

## 2013-01-19 NOTE — Telephone Encounter (Signed)
Spoke with pt- she would like to do formal physical therapy- transitioning to a home exercise program  Sent referral to [facility].  Advised pt to call back if she felt she needed additional pain medication.

## 2013-01-25 ENCOUNTER — Ambulatory Visit: Payer: BC Managed Care – PPO | Attending: Family Medicine

## 2013-01-25 DIAGNOSIS — M2550 Pain in unspecified joint: Secondary | ICD-10-CM | POA: Insufficient documentation

## 2013-01-25 DIAGNOSIS — IMO0001 Reserved for inherently not codable concepts without codable children: Secondary | ICD-10-CM | POA: Insufficient documentation

## 2013-01-25 DIAGNOSIS — M25659 Stiffness of unspecified hip, not elsewhere classified: Secondary | ICD-10-CM | POA: Insufficient documentation

## 2013-01-27 ENCOUNTER — Ambulatory Visit: Payer: BC Managed Care – PPO | Admitting: Physical Therapy

## 2013-01-27 ENCOUNTER — Other Ambulatory Visit (INDEPENDENT_AMBULATORY_CARE_PROVIDER_SITE_OTHER): Payer: BC Managed Care – PPO

## 2013-01-27 DIAGNOSIS — Z0000 Encounter for general adult medical examination without abnormal findings: Secondary | ICD-10-CM

## 2013-01-27 LAB — POCT URINALYSIS DIPSTICK
Bilirubin, UA: NEGATIVE
Blood, UA: NEGATIVE
Glucose, UA: NEGATIVE
Ketones, UA: NEGATIVE
Spec Grav, UA: 1.015

## 2013-01-27 LAB — HEPATIC FUNCTION PANEL
ALT: 29 U/L (ref 0–35)
AST: 25 U/L (ref 0–37)
Alkaline Phosphatase: 65 U/L (ref 39–117)
Bilirubin, Direct: 0.1 mg/dL (ref 0.0–0.3)
Total Bilirubin: 0.5 mg/dL (ref 0.3–1.2)

## 2013-01-27 LAB — LIPID PANEL
Cholesterol: 170 mg/dL (ref 0–200)
LDL Cholesterol: 107 mg/dL — ABNORMAL HIGH (ref 0–99)
Triglycerides: 96 mg/dL (ref 0.0–149.0)

## 2013-01-27 LAB — CBC WITH DIFFERENTIAL/PLATELET
Basophils Absolute: 0.1 10*3/uL (ref 0.0–0.1)
Eosinophils Absolute: 0.2 10*3/uL (ref 0.0–0.7)
Hemoglobin: 13.4 g/dL (ref 12.0–15.0)
Lymphocytes Relative: 25.4 % (ref 12.0–46.0)
MCHC: 33.7 g/dL (ref 30.0–36.0)
Neutro Abs: 5.1 10*3/uL (ref 1.4–7.7)
RDW: 12.5 % (ref 11.5–14.6)

## 2013-01-27 LAB — BASIC METABOLIC PANEL
Calcium: 10.1 mg/dL (ref 8.4–10.5)
Chloride: 108 mEq/L (ref 96–112)
Creatinine, Ser: 0.7 mg/dL (ref 0.4–1.2)

## 2013-02-01 ENCOUNTER — Ambulatory Visit: Payer: BC Managed Care – PPO | Attending: Family Medicine

## 2013-02-01 DIAGNOSIS — M25659 Stiffness of unspecified hip, not elsewhere classified: Secondary | ICD-10-CM | POA: Insufficient documentation

## 2013-02-01 DIAGNOSIS — M2550 Pain in unspecified joint: Secondary | ICD-10-CM | POA: Insufficient documentation

## 2013-02-01 DIAGNOSIS — IMO0001 Reserved for inherently not codable concepts without codable children: Secondary | ICD-10-CM | POA: Insufficient documentation

## 2013-02-03 ENCOUNTER — Ambulatory Visit (INDEPENDENT_AMBULATORY_CARE_PROVIDER_SITE_OTHER): Payer: BC Managed Care – PPO | Admitting: Internal Medicine

## 2013-02-03 ENCOUNTER — Encounter: Payer: Self-pay | Admitting: Internal Medicine

## 2013-02-03 VITALS — BP 120/70 | HR 70 | Temp 98.0°F | Resp 20 | Ht 63.0 in | Wt 168.0 lb

## 2013-02-03 DIAGNOSIS — K219 Gastro-esophageal reflux disease without esophagitis: Secondary | ICD-10-CM

## 2013-02-03 DIAGNOSIS — Z0000 Encounter for general adult medical examination without abnormal findings: Secondary | ICD-10-CM

## 2013-02-03 DIAGNOSIS — J309 Allergic rhinitis, unspecified: Secondary | ICD-10-CM

## 2013-02-03 MED ORDER — ZOLPIDEM TARTRATE 10 MG PO TABS: 10.0000 mg | ORAL_TABLET | Freq: Every evening | ORAL | Status: DC | PRN

## 2013-02-03 NOTE — Patient Instructions (Signed)
It is important that you exercise regularly, at least 20 minutes 3 to 4 times per week.  If you develop chest pain or shortness of breath seek  medical attention.  You need to lose weight.  Consider a lower calorie diet and regular exercise.  Return in one year for follow-up   

## 2013-02-03 NOTE — Progress Notes (Signed)
Subjective:    Patient ID: Kathy Herman, female    DOB: 07/21/1946, 67 y.o.   MRN: [id number]  HPI 67 year old patient who is seen today for a preventive health examination. Medical illnesses include GERD. She has a prior history of a cervical radiculopathy that has resolved    Current Allergies:  ! ERYTHROMYCIN (ERYTHROMYCIN)  Past Medical History:  Allergic rhinitis  history acute right C7 cervical radiculopathy  gravida two, para two,  Past Surgical History:  tubal ligation   Family history father died at 27 complications of heart failure. Mother died at 86 with history of dementia after complications from a traumatic fall. One brother 2 sisters in good health   Past Medical History  Diagnosis Date  . Allergic rhinitis, cause unspecified 05/31/2007  . CERVICAL RADICULOPATHY, RIGHT 01/07/2010  . GERD 07/13/2007    History   Social History  . Marital Status: Married    Spouse Name: N/A    Number of Children: N/A  . Years of Education: N/A   Occupational History  . Not on file.   Social History Main Topics  . Smoking status: Never Smoker   . Smokeless tobacco: Never Used  . Alcohol Use: Not on file  . Drug Use: Not on file  . Sexually Active: Not on file   Other Topics Concern  . Not on file   Social History Narrative  . No narrative on file    Past Surgical History  Procedure Laterality Date  . Tubal ligation      No family history on file.  Allergies  Allergen Reactions  . Erythromycin     REACTION: nausea    Current Outpatient Prescriptions on File Prior to Visit  Medication Sig Dispense Refill  . esomeprazole (NEXIUM) 40 MG capsule Take 40 mg by mouth 2 (two) times daily.        . Multiple Vitamin (MULTIVITAMIN WITH MINERALS) TABS Take 1 tablet by mouth daily.      . ranitidine (ZANTAC) 150 MG tablet Take 300 mg by mouth at bedtime.        . traMADol (ULTRAM) 50 MG tablet 1-2 po q 8 hrs prn pain  60 tablet  0   No current  facility-administered medications on file prior to visit.    BP 120/70  Pulse 70  Temp(Src) 98 F (36.7 C) (Oral)  Resp 20  Ht 5\' 3"  (1.6 m)  Wt 168 lb (76.204 kg)  BMI 29.77 kg/m2  SpO2 97%       Review of Systems  Constitutional: Negative for fever, appetite change, fatigue and unexpected weight change.  HENT: Negative for hearing loss, ear pain, nosebleeds, congestion, sore throat, mouth sores, trouble swallowing, neck stiffness, dental problem, voice change, sinus pressure and tinnitus.   Eyes: Negative for photophobia, pain, redness and visual disturbance.  Respiratory: Negative for cough, chest tightness and shortness of breath.   Cardiovascular: Negative for chest pain, palpitations and leg swelling.  Gastrointestinal: Negative for nausea, vomiting, abdominal pain, diarrhea, constipation, blood in stool, abdominal distention and rectal pain.  Genitourinary: Negative for dysuria, urgency, frequency, hematuria, flank pain, vaginal bleeding, vaginal discharge, difficulty urinating, genital sores, vaginal pain, menstrual problem and pelvic pain.  Musculoskeletal: Negative for back pain and arthralgias.  Skin: Negative for rash.  Neurological: Negative for dizziness, syncope, speech difficulty, weakness, light-headedness, numbness and headaches.  Hematological: Negative for adenopathy. Does not bruise/bleed easily.  Psychiatric/Behavioral: Negative for suicidal ideas, behavioral problems, self-injury, dysphoric mood and agitation. The patient  is not nervous/anxious.        Objective:   Physical Exam  Constitutional: She is oriented to person, place, and time. She appears well-developed and well-nourished.  HENT:  Head: Normocephalic and atraumatic.  Right Ear: External ear normal.  Left Ear: External ear normal.  Mouth/Throat: Oropharynx is clear and moist.  Eyes: Conjunctivae and EOM are normal.  Neck: Normal range of motion. Neck supple. No JVD present. No thyromegaly  present.  Cardiovascular: Normal rate, regular rhythm, normal heart sounds and intact distal pulses.   No murmur heard. Pulmonary/Chest: Effort normal and breath sounds normal. She has no wheezes. She has no rales.  Abdominal: Soft. Bowel sounds are normal. She exhibits no distension and no mass. There is no tenderness. There is no rebound and no guarding.  Musculoskeletal: Normal range of motion. She exhibits no edema and no tenderness.  Neurological: She is alert and oriented to person, place, and time. She has normal reflexes. No cranial nerve deficit. She exhibits normal muscle tone. Coordination normal.  Skin: Skin is warm and dry. No rash noted.  Psychiatric: She has a normal mood and affect. Her behavior is normal.          Assessment & Plan:  Preventive health exam Rule out impaired glucose tolerance. The patient has also initiated better dietary habits N/A) exercise regimen. She has been successful at weight loss. We'll continue present regimen

## 2013-02-07 ENCOUNTER — Ambulatory Visit: Payer: BC Managed Care – PPO

## 2013-02-09 ENCOUNTER — Ambulatory Visit: Payer: BC Managed Care – PPO

## 2013-02-21 ENCOUNTER — Ambulatory Visit (INDEPENDENT_AMBULATORY_CARE_PROVIDER_SITE_OTHER): Payer: BC Managed Care – PPO | Admitting: Family Medicine

## 2013-02-21 VITALS — BP 122/76 | Ht 64.0 in | Wt 165.0 lb

## 2013-02-21 DIAGNOSIS — M25559 Pain in unspecified hip: Secondary | ICD-10-CM

## 2013-02-21 DIAGNOSIS — M25552 Pain in left hip: Secondary | ICD-10-CM

## 2013-02-23 ENCOUNTER — Ambulatory Visit: Payer: BC Managed Care – PPO | Admitting: Physical Therapy

## 2013-02-23 ENCOUNTER — Encounter: Payer: Self-pay | Admitting: Family Medicine

## 2013-02-23 NOTE — Progress Notes (Signed)
  Subjective:    Patient ID: Kathy Herman, female    DOB: 09/02/1945, 67 y.o.   MRN: [id number]  HPI Followup left hip pain. It is some better. She's been working with physical therapy as well as massage therapist. She is start walking and still has some pain after about 10 minutes of walking. She has questions about her rehabilitation.   Review of Systems No new symptoms in the right hip. Pain is improved but not resolved.    Objective:   Physical Exam  Vital signs reviewed GENERAL: Well-developed female no acute distress Hip: Right. Internal/external rotation intact and hip flexor strength intact. Left. Internal/external rotation is painless and full. She has some pain with FABER. MRI review: Left abductor muscle tear.      Assessment & Plan:  Hip pain after a fall. Are reviewed her MRI images in detail. I do think she will ultimately he'll am recommending continued physical therapy, massage, rehabilitation. I will be happy to see her back when necessary

## 2013-02-25 ENCOUNTER — Ambulatory Visit: Payer: BC Managed Care – PPO

## 2013-03-09 ENCOUNTER — Other Ambulatory Visit: Payer: Self-pay

## 2013-03-10 ENCOUNTER — Ambulatory Visit: Payer: BC Managed Care – PPO | Attending: Family Medicine

## 2013-03-10 DIAGNOSIS — IMO0001 Reserved for inherently not codable concepts without codable children: Secondary | ICD-10-CM | POA: Insufficient documentation

## 2013-03-10 DIAGNOSIS — M2550 Pain in unspecified joint: Secondary | ICD-10-CM | POA: Insufficient documentation

## 2013-03-10 DIAGNOSIS — M25659 Stiffness of unspecified hip, not elsewhere classified: Secondary | ICD-10-CM | POA: Insufficient documentation

## 2013-12-14 ENCOUNTER — Other Ambulatory Visit: Payer: Self-pay

## 2013-12-14 DIAGNOSIS — Z1231 Encounter for screening mammogram for malignant neoplasm of breast: Secondary | ICD-10-CM

## 2014-01-03 ENCOUNTER — Ambulatory Visit
Admission: RE | Admit: 2014-01-03 | Discharge: 2014-01-03 | Disposition: A | Discharge status: Home / Self Care | Payer: BC Managed Care – PPO | Source: Ambulatory Visit

## 2014-01-03 DIAGNOSIS — Z1231 Encounter for screening mammogram for malignant neoplasm of breast: Secondary | ICD-10-CM

## 2014-01-25 ENCOUNTER — Other Ambulatory Visit: Payer: Self-pay | Admitting: Obstetrics and Gynecology

## 2014-01-25 DIAGNOSIS — M8580 Other specified disorders of bone density and structure, unspecified site: Secondary | ICD-10-CM

## 2014-02-10 ENCOUNTER — Encounter: Payer: Self-pay | Admitting: Physician Assistant

## 2014-02-10 ENCOUNTER — Ambulatory Visit (INDEPENDENT_AMBULATORY_CARE_PROVIDER_SITE_OTHER): Payer: BC Managed Care – PPO | Admitting: Physician Assistant

## 2014-02-10 VITALS — BP 110/76 | HR 66 | Temp 98.4°F | Resp 18 | Wt 176.0 lb

## 2014-02-10 DIAGNOSIS — S90129A Contusion of unspecified lesser toe(s) without damage to nail, initial encounter: Secondary | ICD-10-CM

## 2014-02-10 DIAGNOSIS — S90221A Contusion of right lesser toe(s) with damage to nail, initial encounter: Secondary | ICD-10-CM

## 2014-02-10 NOTE — Progress Notes (Signed)
Pre visit review using our clinic review tool, if applicable. No additional management support is needed unless otherwise documented below in the visit note. 

## 2014-02-10 NOTE — Progress Notes (Signed)
Subjective:    Patient ID: Kathy Herman, female    DOB: 12-10-1945, 68 y.o.   MRN: [id number]  HPI  Pt noticed that her toe started hurting about 4 days ago. Pt states that she didn't noticed injuring her toe, however she does admit to bracing her feet underneath a chest in order to do sit ups, and is unsure if maybe this caused it. She states that it is a 4/10 throbbing pain, it doesn't radiate. She has noticed a little bit of swelling around the toe as well. She has tried motrin for the pain, which she states helps. She denies F/C/N/V/D/SOB/CP/HA.    Review of Systems As per HPI and are otherwise negative.     Past Medical History  Diagnosis Date  . Allergic rhinitis, cause unspecified 05/31/2007  . CERVICAL RADICULOPATHY, RIGHT 01/07/2010  . GERD 07/13/2007    History   Social History  . Marital Status: Married    Spouse Name: N/A    Number of Children: N/A  . Years of Education: N/A   Occupational History  . Not on file.   Social History Main Topics  . Smoking status: Never Smoker   . Smokeless tobacco: Never Used  . Alcohol Use: Not on file  . Drug Use: Not on file  . Sexual Activity: Not on file   Other Topics Concern  . Not on file   Social History Narrative  . No narrative on file    Past Surgical History  Procedure Laterality Date  . Tubal ligation      No family history on file.  Allergies  Allergen Reactions  . Erythromycin     REACTION: nausea    Current Outpatient Prescriptions on File Prior to Visit  Medication Sig Dispense Refill  . esomeprazole (NEXIUM) 40 MG capsule Take 40 mg by mouth 2 (two) times daily.        . Multiple Vitamin (MULTIVITAMIN WITH MINERALS) TABS Take 1 tablet by mouth daily.      . ranitidine (ZANTAC) 150 MG tablet Take 300 mg by mouth at bedtime.        . traMADol (ULTRAM) 50 MG tablet 1-2 po q 8 hrs prn pain  60 tablet  0  . zolpidem (AMBIEN) 10 MG tablet Take 1 tablet (10 mg total) by mouth at bedtime as  needed for sleep.  15 tablet  1   No current facility-administered medications on file prior to visit.    EXAM: BP 110/76  Pulse 66  Temp(Src) 98.4 F (36.9 C) (Oral)  Resp 18  Wt 176 lb (79.833 kg)     Objective:   Physical Exam  Nursing note and vitals reviewed. Constitutional: She is oriented to person, place, and time. She appears well-developed and well-nourished. No distress.  HENT:  Head: Normocephalic and atraumatic.  Eyes: Conjunctivae and EOM are normal. Pupils are equal, round, and reactive to light.  Neck: Normal range of motion.  Cardiovascular: Normal rate, regular rhythm and intact distal pulses.   Pulmonary/Chest: Effort normal and breath sounds normal. No respiratory distress. She exhibits no tenderness.  Musculoskeletal: Normal range of motion.  The right 1st toenail has a small old subungual hematoma. No visible liquid blood requiring draining. No surrounding erythema, swelling, warmth.  Neurological: She is alert and oriented to person, place, and time.  Skin: Skin is warm and dry. No rash noted. She is not diaphoretic. No erythema. No pallor.  Psychiatric: She has a normal mood and affect. Her  behavior is normal. Judgment and thought content normal.     Lab Results  Component Value Date   WBC 7.7 01/27/2013   HGB 13.4 01/27/2013   HCT 39.7 01/27/2013   PLT 287.0 01/27/2013   GLUCOSE 118* 01/27/2013   CHOL 170 01/27/2013   TRIG 96.0 01/27/2013   HDL 43.40 01/27/2013   LDLCALC 107* 01/27/2013   ALT 29 01/27/2013   AST 25 01/27/2013   NA 139 01/27/2013   K 4.0 01/27/2013   CL 108 01/27/2013   CREATININE 0.7 01/27/2013   BUN 15 01/27/2013   CO2 23 01/27/2013   TSH 1.61 01/27/2013        Assessment & Plan:  Darl PikesSusan was seen today for right great toe bruise.  Diagnoses and associated orders for this visit:  Subungual hematoma of toenail, right, initial encounter Comments: Mild, a few days old. No lifting of the nail, no visible liquid blood. Will try at home  care, if worsens, gave pt info on podiatry.     Return precautions provided, and patient handout on subungual hematoma.  Plan to follow up as needed, or for worsening or persistent symptoms despite treatment.  Patient Instructions  There doesn't appear to be any blood under your nail that could benefit from draining.  If you feel as though your pain is worsening, consider scheduling an appointment with the podiatrist.  If emergency symptoms discussed during visit developed, seek medical attention immediately.  Followup as needed, or for worsening or persistent symptoms despite treatment.

## 2014-02-10 NOTE — Patient Instructions (Signed)
There doesn't appear to be any blood under your nail that could benefit from draining.  If you feel as though your pain is worsening, consider scheduling an appointment with the podiatrist.  If emergency symptoms discussed during visit developed, seek medical attention immediately.  Followup as needed, or for worsening or persistent symptoms despite treatment.   Subungual Hematoma  A subungual hematoma is a pocket of blood under the fingernail or toenail. The nail may turn blue or feel painful. HOME CARE  Put ice on the injured area.  Put ice in a plastic bag.  Place a towel between your skin and the bag.  Leave the ice on for 15-20 minutes, 03-04 times a day. Do this for the first 1 to 2 days.  Raise (elevate) the injured area to lessen pain and puffiness (swelling).  If you were given a bandage, wear it for as long as told by your doctor.  If part of your nail falls off, trim the rest of the nail gently.  Only take medicines as told by your doctor. GET HELP RIGHT AWAY IF:  You have redness or puffiness around the nail.  You have yellowish-white fluid (pus) coming from the nail.  Your pain does not get better with medicine.  You have a fever. MAKE SURE YOU:  Understand these instructions.  Will watch your condition.  Will get help right away if you are not doing well or get worse. Document Released: 10/13/2011 Document Reviewed: 10/13/2011 [facility] Patient Information 2015 [place], [place]. This information is not intended to replace advice given to you by your health care provider. Make sure you discuss any questions you have with your health care provider.

## 2014-05-30 ENCOUNTER — Ambulatory Visit
Admission: RE | Admit: 2014-05-30 | Discharge: 2014-05-30 | Disposition: A | Discharge status: Home / Self Care | Payer: BC Managed Care – PPO | Source: Ambulatory Visit | Attending: Obstetrics and Gynecology | Admitting: Obstetrics and Gynecology

## 2014-05-30 ENCOUNTER — Other Ambulatory Visit: Payer: BC Managed Care – PPO

## 2014-05-30 DIAGNOSIS — M8580 Other specified disorders of bone density and structure, unspecified site: Secondary | ICD-10-CM

## 2014-08-04 HISTORY — PX: BLEPHAROPLASTY: SUR158

## 2014-08-10 ENCOUNTER — Encounter: Payer: Self-pay | Admitting: Sports Medicine

## 2014-08-10 ENCOUNTER — Ambulatory Visit (INDEPENDENT_AMBULATORY_CARE_PROVIDER_SITE_OTHER): Payer: BLUE CROSS/BLUE SHIELD | Admitting: Sports Medicine

## 2014-08-10 VITALS — BP 148/70 | Ht 64.0 in | Wt 170.0 lb

## 2014-08-10 DIAGNOSIS — M23303 Other meniscus derangements, unspecified medial meniscus, right knee: Secondary | ICD-10-CM

## 2014-08-10 DIAGNOSIS — M23306 Other meniscus derangements, unspecified meniscus, right knee: Secondary | ICD-10-CM

## 2014-08-10 DIAGNOSIS — M79605 Pain in left leg: Secondary | ICD-10-CM

## 2014-08-10 DIAGNOSIS — M79602 Pain in left arm: Secondary | ICD-10-CM

## 2014-08-10 DIAGNOSIS — M23300 Other meniscus derangements, unspecified lateral meniscus, right knee: Secondary | ICD-10-CM

## 2014-08-10 NOTE — Progress Notes (Signed)
   Subjective:    Patient ID: Kathy Herman, female    DOB: 28-Mar-1946, 69 y.o.   MRN: [id number]  HPI Kathy Herman is a 69 year old who presents today with bilateral knee pain, greater on R than on L. She has worn helix on right, which has been helping. Steep steps and getting up off the ground is what hurts the most. Normal walking is fine with some pain, but doesn't hurt like when going up stairs.  Pain is caused by some exercises from personal trainer.  In addition she has pain in her left upper leg area that radiates into her buttock, but does not radiate down her leg. She thinks it may be related to an injury she had a few years ago. Wearing spandex while exercising does help.   Review of Systems ROS negative other than what is mentioned in HPI.     Objective:   Physical Exam  Gen: Well-appearing, well-nourished, no acute distress. MSK: Full extension of knees bilaterally. Flexion to about 140 on right, 160 on left. Good strength in bilateral hip abduction.  Mild effusion noted Medial compt spurring  US RT Mild effusion RT only and not left Degenerative change of meniscus medial and lateral but some preserved joint space QT and PT OK    Assessment & Plan:   1. Meniscus degeneration, right - stop doing exercise from sitting in a chair to standing - use stationary bike for primary exercise - walking is ok, but if having knee pain, walk every other day instead of daily - avoid regular NSAID  2. Left upper leg pain - could be related to a torn insertion HS site 2/2 trauma a few years ago - continue bike shorts with exercise - make appointment for U/S  Patient was seen and discussed with my attending, Dr. Darrick PennaFields.  Karmen StabsE. Paige Ciena Sampley, MD, PGY-1 [facility] 08/10/2014  11:29 AM   Agree with assessment   Sterling BigKB Fields, MD

## 2014-08-10 NOTE — Assessment & Plan Note (Signed)
Try conservative care Use compression  Needs more consistent biking  Reck for hip pain and ? Of piriformis

## 2014-09-13 ENCOUNTER — Ambulatory Visit: Payer: BLUE CROSS/BLUE SHIELD | Admitting: Sports Medicine

## 2014-09-20 ENCOUNTER — Ambulatory Visit
Admission: RE | Admit: 2014-09-20 | Discharge: 2014-09-20 | Disposition: A | Discharge status: Home / Self Care | Payer: BLUE CROSS/BLUE SHIELD | Source: Ambulatory Visit | Attending: Sports Medicine | Admitting: Sports Medicine

## 2014-09-20 ENCOUNTER — Ambulatory Visit (INDEPENDENT_AMBULATORY_CARE_PROVIDER_SITE_OTHER): Payer: BLUE CROSS/BLUE SHIELD | Admitting: Sports Medicine

## 2014-09-20 VITALS — BP 129/80 | Ht 64.0 in | Wt 170.0 lb

## 2014-09-20 DIAGNOSIS — M25551 Pain in right hip: Secondary | ICD-10-CM

## 2014-09-20 DIAGNOSIS — M25552 Pain in left hip: Principal | ICD-10-CM

## 2014-09-20 MED ORDER — TRAMADOL HCL 50 MG PO TABS: ORAL_TABLET | ORAL | Status: DC

## 2014-09-20 NOTE — Progress Notes (Signed)
Patient ID: Kathy MelnickSusan S Herman, female   DOB: [birth year], 69 y.o.   MRN: [id number]  In 2014 the patient had a fall and was evaluated by Dr. 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 Jennette KettleNeal was concerned about possible labral injury  Examination Obese female in no acute distress BP 129/80 mmHg  Ht 5\' 4"  (1.626 m)  Wt 170 lb (77.111 kg)  BMI 29.17 kg/m2  Rotational range of motion of both hips is at least 60 Internal rotation bilaterally 20 FABER is very tight bilaterally FADIR is painful on the left only  Gluteus medius, gluteus maximus and tensor fascia lata strength is excellent Hip flexion is excellent Bilaterally  AP examination of the pelvis by x-ray reveals some mild increase in the arthritic change of both hips

## 2014-09-20 NOTE — Patient Instructions (Signed)
Do not push internal rotation of left hip because of torn cartilage  Massage to deep muscles in buttocks and hip would be good  Do perform easy circular motion and rotation of both hips  Work on stretching hit turning outward  Don't push past pain in hips  Tramadol - try 1 tablet in morning Use another tablet or 2 daily if needed or for exercise class  We will double check the Xray today to see that arthritis is stable

## 2014-09-20 NOTE — Assessment & Plan Note (Signed)
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

## 2015-01-29 ENCOUNTER — Other Ambulatory Visit: Payer: Self-pay

## 2015-04-30 ENCOUNTER — Other Ambulatory Visit: Payer: Self-pay

## 2015-04-30 DIAGNOSIS — Z1231 Encounter for screening mammogram for malignant neoplasm of breast: Secondary | ICD-10-CM

## 2015-05-02 ENCOUNTER — Ambulatory Visit
Admission: RE | Admit: 2015-05-02 | Discharge: 2015-05-02 | Disposition: A | Discharge status: Home / Self Care | Payer: BLUE CROSS/BLUE SHIELD | Source: Ambulatory Visit

## 2015-05-02 DIAGNOSIS — Z1231 Encounter for screening mammogram for malignant neoplasm of breast: Secondary | ICD-10-CM

## 2015-10-24 IMAGING — CR DG PELVIS 1-2V
1 series · 1 of 1 positions shown · non-contrast
Comparison: None.

CLINICAL DATA: Injury.  Fall 2 years ago.

EXAM:
PELVIS - 1-2 VIEW

[t pelvis a.p.]
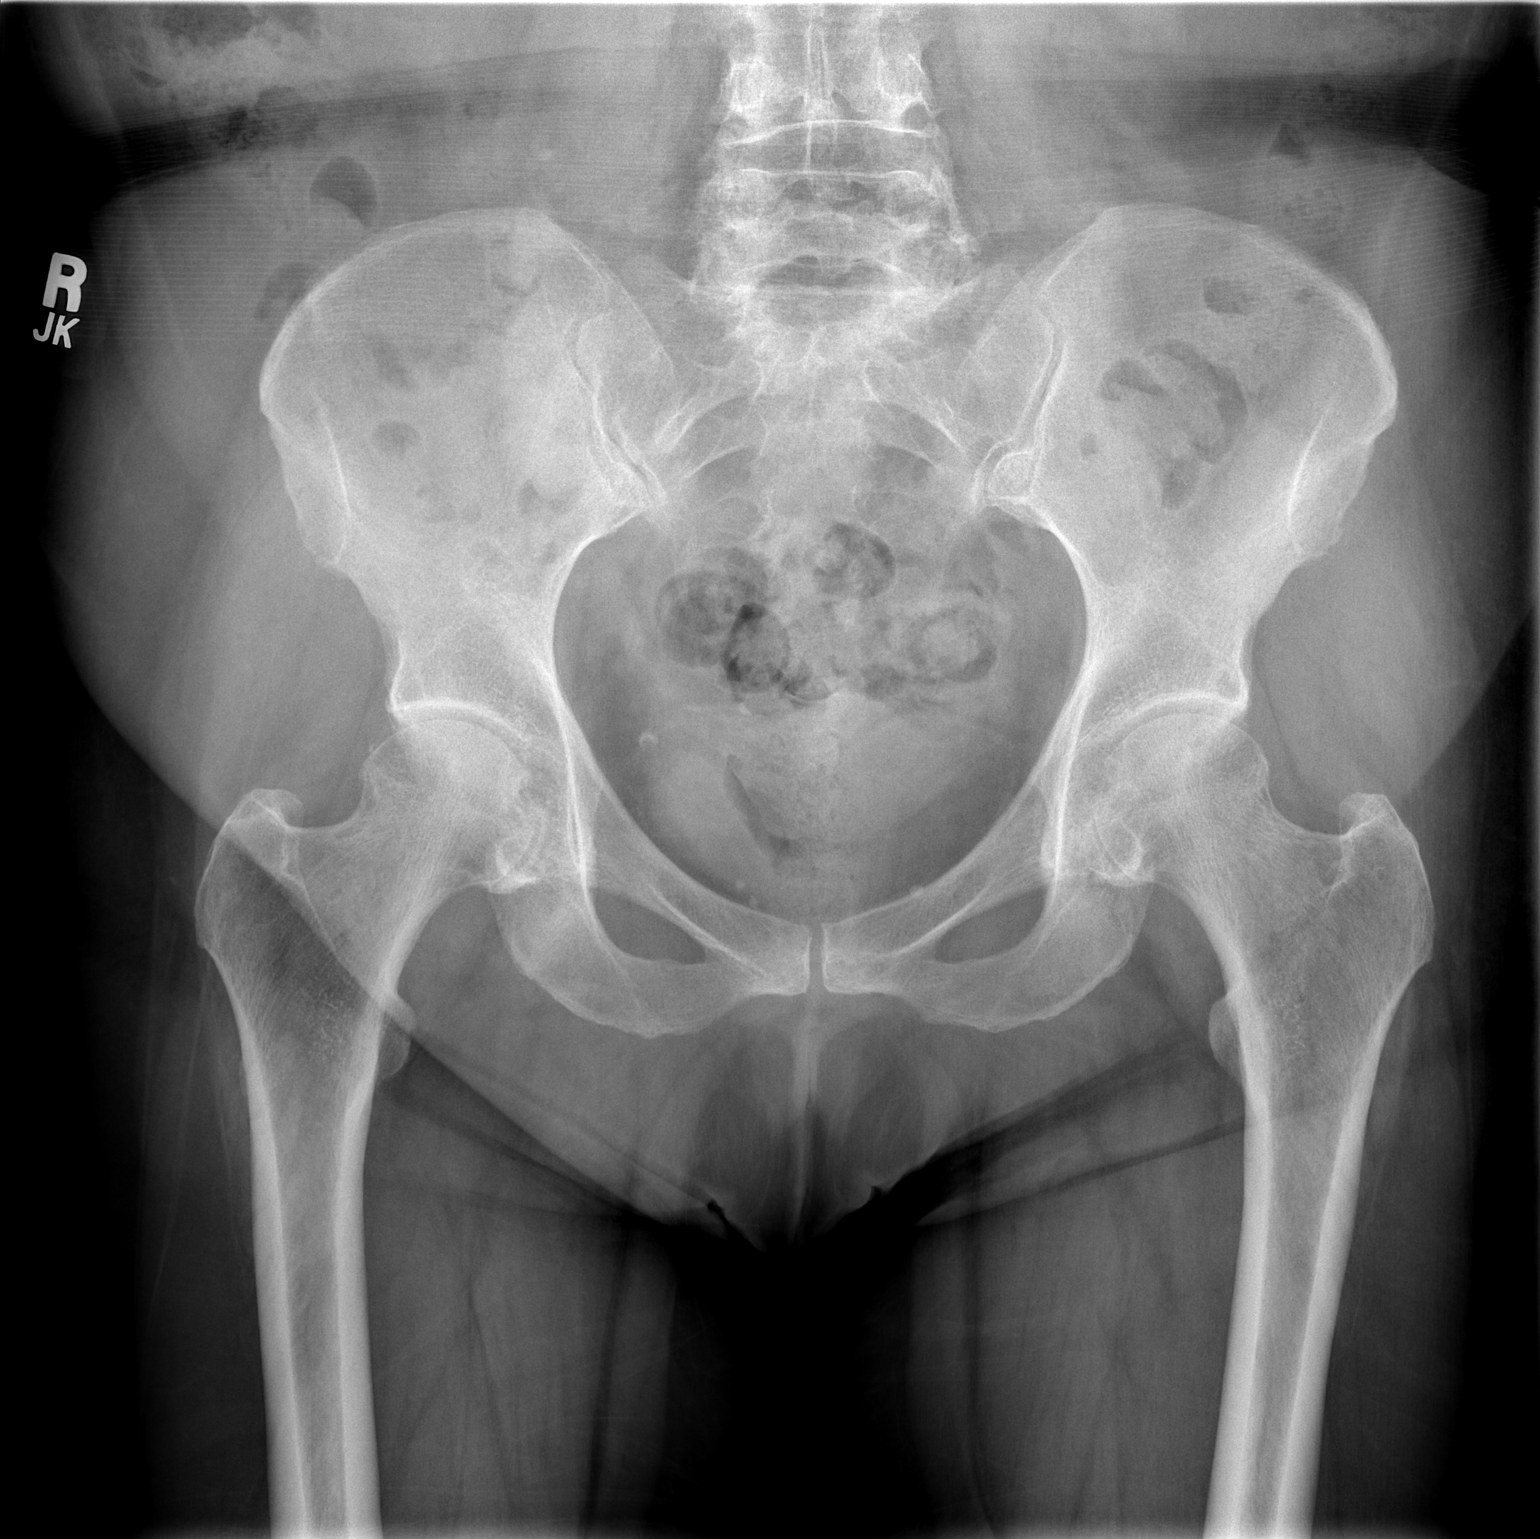

[1 of 1 positions shown; findings below may reference images not displayed]

FINDINGS: No acute bony or joint abnormality identified. No evidence of
fracture dislocation. Degenerative changes lumbar spine and both
hips. Pelvic calcifications noted consistent phleboliths.
IMPRESSION: Degenerative changes lumbar spine and both hips. No acute
abnormality.

## 2016-04-04 ENCOUNTER — Encounter: Payer: Self-pay | Admitting: Family Medicine

## 2016-04-04 ENCOUNTER — Ambulatory Visit (INDEPENDENT_AMBULATORY_CARE_PROVIDER_SITE_OTHER): Payer: BLUE CROSS/BLUE SHIELD | Admitting: Family Medicine

## 2016-04-04 VITALS — BP 100/72 | HR 89 | Temp 98.1°F | Ht 64.0 in | Wt 173.6 lb

## 2016-04-04 DIAGNOSIS — J069 Acute upper respiratory infection, unspecified: Secondary | ICD-10-CM

## 2016-04-04 MED ORDER — AMOXICILLIN-POT CLAVULANATE 875-125 MG PO TABS: 1.0000 | ORAL_TABLET | Freq: Two times a day (BID) | ORAL | 0 refills | Status: DC

## 2016-04-04 MED ORDER — BENZONATATE 100 MG PO CAPS: 100.0000 mg | ORAL_CAPSULE | Freq: Three times a day (TID) | ORAL | 0 refills | Status: DC | PRN

## 2016-04-04 NOTE — Patient Instructions (Signed)
INSTRUCTIONS FOR UPPER RESPIRATORY INFECTION:  -if you develop thick white nasal mucus, sinus pain or tooth pain or are worsening instead of improving over the next 3-4 days with the treatments provided may use the antibiotic. Otherwise please shred the antibiotic prescription. Of course if you are feeling a lot worse, are struggling to breath or are having other serious symptoms please seek medical care.  -nasal saline wash 2-3 times daily (use prepackaged nasal saline or bottled/distilled water if making your own)   -can use AFRIN nasal spray for drainage and nasal congestion - but do NOT use longer then 3-4 days  -can use tylenol (in no history of liver disease) or ibuprofen (if no history of kidney disease, bowel bleeding or significant heart disease) as directed for aches and sorethroat  -if you are taking a cough medication - use only as directed, may also try a teaspoon of honey to coat the throat and throat lozenges.  -for sore throat, salt water gargles can help  -follow up if you have fevers, facial pain, tooth pain, difficulty breathing or are worsening or symptoms persist longer then expected  Upper Respiratory Infection, Adult An upper respiratory infection (URI) is also known as the common cold. It is often caused by a type of germ (virus). Colds are easily spread (contagious). You can pass it to others by kissing, coughing, sneezing, or drinking out of the same glass. Usually, you get better in 1 to 3  weeks.  However, the cough can last for even longer. HOME CARE   Only take medicine as told by your doctor. Follow instructions provided above.  Drink enough water and fluids to keep your pee (urine) clear or pale yellow.  Get plenty of rest.  Return to work when your temperature is < 100 for 24 hours or as told by your doctor. You may use a face mask and wash your hands to stop your cold from spreading. GET HELP RIGHT AWAY IF:   After the first few days, you feel you are  getting worse.  You have questions about your medicine.  You have chills, shortness of breath, or red spit (mucus).  You have pain in the face for more then 1-2 days, especially when you bend forward.  You have a fever, puffy (swollen) neck, pain when you swallow, or white spots in the back of your throat.  You have a bad headache, ear pain, sinus pain, or chest pain.  You have a high-pitched whistling sound when you breathe in and out (wheezing).  You cough up blood.  You have sore muscles or a stiff neck. MAKE SURE YOU:   Understand these instructions.  Will watch your condition.  Will get help right away if you are not doing well or get worse. Document Released: 01/07/2008 Document Revised: 10/13/2011 Document Reviewed: 10/26/2013 [facility] Patient Information 2015 [place], [place]. This information is not intended to replace advice given to you by your health care provider. Make sure you discuss any questions you have with your health care provider.

## 2016-04-04 NOTE — Progress Notes (Signed)
HPI:  URI: -started: 6-7 days ago and not improving -symptoms:nasal congestion, body aches, sore throat, cough -denies:fever, SOB, NVD, tooth pain, rash -sick contacts/travel/risks: no reported flu, strep or tick exposure -Hx of: allergies  ROS: See pertinent positives and negatives per HPI.  Past Medical History:  Diagnosis Date  . Allergic rhinitis, cause unspecified 05/31/2007  . CERVICAL RADICULOPATHY, RIGHT 01/07/2010  . GERD 07/13/2007    Past Surgical History:  Procedure Laterality Date  . TUBAL LIGATION      No family history on file.  Social History   Social History  . Marital status: Married    Spouse name: N/A  . Number of children: N/A  . Years of education: N/A   Social History Main Topics  . Smoking status: Never Smoker  . Smokeless tobacco: Never Used  . Alcohol use None  . Drug use: Unknown  . Sexual activity: Not Asked   Other Topics Concern  . None   Social History Narrative  . None     Current Outpatient Prescriptions:  .  esomeprazole (NEXIUM) 40 MG capsule, Take 40 mg by mouth 2 (two) times daily.  , Disp: , Rfl:  .  Multiple Vitamin (MULTIVITAMIN WITH MINERALS) TABS, Take 1 tablet by mouth daily., Disp: , Rfl:  .  valACYclovir (VALTREX) 1000 MG tablet, , Disp: , Rfl: 0 .  amoxicillin-clavulanate (AUGMENTIN) 875-125 MG tablet, Take 1 tablet by mouth 2 (two) times daily., Disp: 20 tablet, Rfl: 0 .  benzonatate (TESSALON PERLES) 100 MG capsule, Take 1 capsule (100 mg total) by mouth 3 (three) times daily as needed., Disp: 20 capsule, Rfl: 0  EXAM:  Vitals:   04/04/16 0941  BP: 100/72  Pulse: 89  Temp: 98.1 F (36.7 C)    Body mass index is 29.8 kg/m.  GENERAL: vitals reviewed and listed above, alert, oriented, appears well hydrated and in no acute distress  HEENT: atraumatic, conjunttiva clear, no obvious abnormalities on inspection of external nose and ears, normal appearance of ear canals and TMs except for clear effusion L,  excessive clear nasal congestion, mild post oropharyngeal erythema with PND, no tonsillar edema or exudate, no sinus TTP  NECK: no obvious masses on inspection  LUNGS: clear to auscultation bilaterally, no wheezes, rales or rhonchi, good air movement  CV: HRRR, no peripheral edema  MS: moves all extremities without noticeable abnormality  PSYCH: pleasant and cooperative, no obvious depression or anxiety  ASSESSMENT AND PLAN:  Discussed the following assessment and plan:  Acute upper respiratory infection  -given HPI and exam findings today, a serious infection or illness is unlikely. We discussed potential etiologies, with VURI being most likely, and advised supportive care and monitoring. We discussed treatment side effects, likely course, antibiotic misuse, transmission, and signs of developing a serious illness. Given going into the long weekend and she is worried about a developing worsening infection discussed signs/symptoms bacterial sinusitis and abx for delayed use if needed provided. Tessalon for cough. -of course, we advised to return or notify a doctor immediately if symptoms worsen or persist or new concerns arise.    Patient Instructions  INSTRUCTIONS FOR UPPER RESPIRATORY INFECTION:  -if you develop thick white nasal mucus, sinus pain or tooth pain or are worsening instead of improving over the next 3-4 days with the treatments provided may use the antibiotic. Otherwise please shred the antibiotic prescription. Of course if you are feeling a lot worse, are struggling to breath or are having other serious symptoms please seek  medical care.  -nasal saline wash 2-3 times daily (use prepackaged nasal saline or bottled/distilled water if making your own)   -can use AFRIN nasal spray for drainage and nasal congestion - but do NOT use longer then 3-4 days  -can use tylenol (in no history of liver disease) or ibuprofen (if no history of kidney disease, bowel bleeding or  significant heart disease) as directed for aches and sorethroat  -if you are taking a cough medication - use only as directed, may also try a teaspoon of honey to coat the throat and throat lozenges.  -for sore throat, salt water gargles can help  -follow up if you have fevers, facial pain, tooth pain, difficulty breathing or are worsening or symptoms persist longer then expected  Upper Respiratory Infection, Adult An upper respiratory infection (URI) is also known as the common cold. It is often caused by a type of germ (virus). Colds are easily spread (contagious). You can pass it to others by kissing, coughing, sneezing, or drinking out of the same glass. Usually, you get better in 1 to 3  weeks.  However, the cough can last for even longer. HOME CARE   Only take medicine as told by your doctor. Follow instructions provided above.  Drink enough water and fluids to keep your pee (urine) clear or pale yellow.  Get plenty of rest.  Return to work when your temperature is < 100 for 24 hours or as told by your doctor. You may use a face mask and wash your hands to stop your cold from spreading. GET HELP RIGHT AWAY IF:   After the first few days, you feel you are getting worse.  You have questions about your medicine.  You have chills, shortness of breath, or red spit (mucus).  You have pain in the face for more then 1-2 days, especially when you bend forward.  You have a fever, puffy (swollen) neck, pain when you swallow, or white spots in the back of your throat.  You have a bad headache, ear pain, sinus pain, or chest pain.  You have a high-pitched whistling sound when you breathe in and out (wheezing).  You cough up blood.  You have sore muscles or a stiff neck. MAKE SURE YOU:   Understand these instructions.  Will watch your condition.  Will get help right away if you are not doing well or get worse. Document Released: 01/07/2008 Document Revised: 10/13/2011 Document  Reviewed: 10/26/2013 [facility] Patient Information 2015 [place], [place]. This information is not intended to replace advice given to you by your health care provider. Make sure you discuss any questions you have with your health care provider.    Kriste Basque R., DO

## 2016-04-04 NOTE — Progress Notes (Signed)
Pre visit review using our clinic review tool, if applicable. No additional management support is needed unless otherwise documented below in the visit note. 

## 2016-04-08 ENCOUNTER — Telehealth: Payer: Self-pay | Admitting: *Deleted

## 2016-04-08 NOTE — Telephone Encounter (Signed)
Left detailed message on personal voicemail per Dr.K he would like you to stop Augmentin, Align OTC medications one tablet daily x 7 days and add Mucinex DM one tablet twice a day. Any questions please call office.

## 2016-04-08 NOTE — Telephone Encounter (Signed)
Discontinue Augmentin Align 1 daily for 7 days  Add  Mucinex DM twice daily

## 2016-04-08 NOTE — Telephone Encounter (Signed)
Patient saw Dr. Selena Batten on Friday, September 1st and dx with URI. Augmentin and Tessalon perles prescribed. Patient concerned of no relief (3 days) and now with diarrhea. Please advise on home advice or if warrants appointment.   ----------------------------------------------------------------------- PLEASE NOTE: All timestamps contained within this report are represented as [place] Standard Time. CONFIDENTIALTY NOTICE: This fax transmission is intended only for the addressee. It contains information that is legally privileged, confidential or otherwise protected from use or disclosure. If you are not the intended recipient, you are strictly prohibited from reviewing, disclosing, copying using or disseminating any of this information or taking any action in reliance on or regarding this information. If you have received this fax in error, please notify us immediately by telephone so that we can arrange for its return to [place]. Phone: [phone], Toll-Free: [phone], Fax: [phone] Page: 1 of 2 Call Id: [id number] [facility] Night - Client TELEPHONE ADVICE RECORD [facility] Patient Name: Kathy Herman Gender: Female DOB: 1946-03-09 Age: 70 Y 12 D Return Phone Number: [phone] (Primary) Address: City/State/Zip: [place] Client [facility] Night - Client Client Site [facility] - Night Physician Kriste Basque- MD Contact Type Call Who Is Calling Patient / Member / Family / Caregiver Call Type Triage / Clinical Relationship To Patient Self Return Phone Number [phone] (Primary) Chief Complaint Cough Reason for Call Symptomatic / Request for Health Information Initial Comment Caller states saw dr on Fri, dr gave her meds, not feeling any better, bad cough, having diarrhea, can't sleep due to cough PreDisposition Did not know what to do Translation No Nurse Assessment Nurse: Annye English, RN, Denise Date/Time  (Eastern Time): 04/07/2016 9:10:18 AM Confirm and document reason for call. If symptomatic, describe symptoms. You must click the next button to save text entered. ---Caller states saw MD on Fri and received meds (Amox and cough med), not feeling any better, bad cough, having diarrhea, can't sleep due to cough. Has the patient traveled out of the country within the last 30 days? ---Not Applicable Does the patient have any new or worsening symptoms? ---Yes Will a triage be completed? ---Yes Related visit to physician within the last 2 weeks? ---Yes Does the PT have any chronic conditions? (i.e. diabetes, asthma, etc.) ---No Is this a behavioral health or substance abuse call? ---No Guidelines Guideline Title Affirmed Question Affirmed Notes Nurse Date/Time Lamount Cohen Time) Cough - Acute Productive Wheezing is present Carmon, RN, Denise 04/07/2016 9:11:52 AM Disp. Time Lamount Cohen Time) Disposition Final User 04/07/2016 9:15:19 AM See Physician within 4 Hours (or PCP triage) Yes Carmon, RN, Leighton Ruff Understands: Yes PLEASE NOTE: All timestamps contained within this report are represented as [place] Standard Time. CONFIDENTIALTY NOTICE: This fax transmission is intended only for the addressee. It contains information that is legally privileged, confidential or otherwise protected from use or disclosure. If you are not the intended recipient, you are strictly prohibited from reviewing, disclosing, copying using or disseminating any of this information or taking any action in reliance on or regarding this information. If you have received this fax in error, please notify us immediately by telephone so that we can arrange for its return to [place]. Phone: [phone], Toll-Free: [phone], Fax: [phone] Page: 2 of 2 Call Id: [id number] Disagree/Comply: Comply Care Advice Given Per Guideline SEE PHYSICIAN WITHIN 4 HOURS (or PCP triage): COUGH MEDICINES: AVOID TOBACCO SMOKE: Smoking or  being exposed to smoke makes coughs much worse. CALL BACK IF: * You become worse. CARE ADVICE  given per Cough - Acute Productive (Adult) guideline. Referrals GO TO FACILITY UNDECIDED

## 2016-04-08 NOTE — Telephone Encounter (Signed)
Please see message and advise 

## 2016-04-10 ENCOUNTER — Other Ambulatory Visit: Payer: Self-pay | Admitting: Obstetrics and Gynecology

## 2016-04-10 DIAGNOSIS — Z1231 Encounter for screening mammogram for malignant neoplasm of breast: Secondary | ICD-10-CM

## 2016-04-25 ENCOUNTER — Encounter: Payer: Self-pay | Admitting: Adult Health

## 2016-04-25 ENCOUNTER — Ambulatory Visit (INDEPENDENT_AMBULATORY_CARE_PROVIDER_SITE_OTHER): Payer: BLUE CROSS/BLUE SHIELD | Admitting: Adult Health

## 2016-04-25 VITALS — BP 112/64 | Temp 98.2°F | Ht 64.0 in | Wt 175.5 lb

## 2016-04-25 DIAGNOSIS — J029 Acute pharyngitis, unspecified: Secondary | ICD-10-CM

## 2016-04-25 MED ORDER — MAGIC MOUTHWASH W/LIDOCAINE: 5.0000 mL | Freq: Three times a day (TID) | ORAL | 0 refills | Status: DC | PRN

## 2016-04-25 NOTE — Patient Instructions (Signed)
It was great meeting you today.   Your sore throat is likely caused by a virus or allergies. Continue with Claritin and add flonase.   You can use the Magic Mouthwash as needed  Follow up with PCP if no improvement

## 2016-04-25 NOTE — Progress Notes (Signed)
Subjective:    Patient ID: Kathy Herman, female    DOB: 06-08-1946, 70 y.o.   MRN: [id number]  Sore Throat   This is a recurrent problem. The current episode started in the past 7 days. The problem has been unchanged. There has been no fever. Associated symptoms include coughing and ear pain (Right sided). Pertinent negatives include no diarrhea, ear discharge, headaches, plugged ear sensation, shortness of breath, trouble swallowing or vomiting. She has had no exposure to strep or mono. Treatments tried: claritin  The treatment provided no relief.    Review of Systems  HENT: Positive for ear pain (Right sided), postnasal drip and sore throat. Negative for ear discharge, rhinorrhea, sinus pressure, trouble swallowing and voice change.   Respiratory: Positive for cough. Negative for shortness of breath.   Gastrointestinal: Negative for diarrhea and vomiting.  Neurological: Negative for headaches.   Past Medical History:  Diagnosis Date  . Allergic rhinitis, cause unspecified 05/31/2007  . CERVICAL RADICULOPATHY, RIGHT 01/07/2010  . GERD 07/13/2007    Social History   Social History  . Marital status: Married    Spouse name: N/A  . Number of children: N/A  . Years of education: N/A   Occupational History  . Not on file.   Social History Main Topics  . Smoking status: Never Smoker  . Smokeless tobacco: Never Used  . Alcohol use Not on file  . Drug use: Unknown  . Sexual activity: Not on file   Other Topics Concern  . Not on file   Social History Narrative  . No narrative on file    Past Surgical History:  Procedure Laterality Date  . TUBAL LIGATION      No family history on file.  Allergies  Allergen Reactions  . Erythromycin     REACTION: nausea    Current Outpatient Prescriptions on File Prior to Visit  Medication Sig Dispense Refill  . esomeprazole (NEXIUM) 40 MG capsule Take 40 mg by mouth 2 (two) times daily.      . Multiple Vitamin (MULTIVITAMIN  WITH MINERALS) TABS Take 1 tablet by mouth daily.    . valACYclovir (VALTREX) 1000 MG tablet   0   No current facility-administered medications on file prior to visit.     BP 112/64   Temp 98.2 F (36.8 C) (Oral)   Ht 5\' 4"  (1.626 m)   Wt 175 lb 8 oz (79.6 kg)   BMI 30.12 kg/m       Objective:   Physical Exam  Constitutional: She is oriented to person, place, and time. She appears well-developed and well-nourished. No distress.  HENT:  Head: Normocephalic and atraumatic.  Right Ear: Hearing, tympanic membrane, external ear and ear canal normal.  Left Ear: Hearing, tympanic membrane, external ear and ear canal normal.  Nose: No rhinorrhea. Right sinus exhibits no maxillary sinus tenderness and no frontal sinus tenderness. Left sinus exhibits no maxillary sinus tenderness.  Mouth/Throat: Uvula is midline and mucous membranes are normal. Mucous membranes are not pale and not dry. Posterior oropharyngeal erythema present. No oropharyngeal exudate, posterior oropharyngeal edema or tonsillar abscesses.  Eyes: Conjunctivae and EOM are normal. Pupils are equal, round, and reactive to light. Right eye exhibits no discharge. Left eye exhibits no discharge. No scleral icterus.  Neck: Normal range of motion. Neck supple. No thyromegaly present.  Cardiovascular: Normal rate, regular rhythm, normal heart sounds and intact distal pulses.  Exam reveals no gallop and no friction rub.   No murmur  heard. Pulmonary/Chest: Effort normal and breath sounds normal. No respiratory distress. She has no wheezes. She has no rales. She exhibits no tenderness.  Abdominal: Soft. Normal appearance and normal aorta. There is no hepatosplenomegaly, splenomegaly or hepatomegaly. There is no CVA tenderness.  Lymphadenopathy:    She has no cervical adenopathy.  Neurological: She is alert and oriented to person, place, and time.  Skin: Skin is warm and dry. No rash noted. She is not diaphoretic. No erythema. No pallor.    Psychiatric: She has a normal mood and affect. Her behavior is normal. Judgment and thought content normal.  Nursing note and vitals reviewed.     Assessment & Plan:  1. Sore throat - Likely viral or seasonal allergy related.  - No signs of strep or bacterial infection  - Advised warm salt water gargles - magic mouthwash w/lidocaine SOLN; Take 5 mLs by mouth 3 (three) times daily as needed.  Dispense: 180 mL; Refill: 0 - Continue with claritin and add flonase - Follow up if no improvement in the next 2-3 days   Shirline Freesory Eleuterio Dollar, NP

## 2016-05-02 ENCOUNTER — Ambulatory Visit
Admission: RE | Admit: 2016-05-02 | Discharge: 2016-05-02 | Disposition: A | Discharge status: Home / Self Care | Payer: BLUE CROSS/BLUE SHIELD | Source: Ambulatory Visit | Attending: Obstetrics and Gynecology | Admitting: Obstetrics and Gynecology

## 2016-05-02 DIAGNOSIS — Z1231 Encounter for screening mammogram for malignant neoplasm of breast: Secondary | ICD-10-CM

## 2016-05-13 ENCOUNTER — Encounter: Payer: Self-pay | Admitting: Adult Health

## 2016-05-13 ENCOUNTER — Ambulatory Visit (INDEPENDENT_AMBULATORY_CARE_PROVIDER_SITE_OTHER): Payer: BLUE CROSS/BLUE SHIELD | Admitting: Adult Health

## 2016-05-13 VITALS — BP 112/56 | Temp 98.1°F | Ht 64.0 in | Wt 172.9 lb

## 2016-05-13 DIAGNOSIS — Z23 Encounter for immunization: Secondary | ICD-10-CM

## 2016-05-13 DIAGNOSIS — J029 Acute pharyngitis, unspecified: Secondary | ICD-10-CM | POA: Diagnosis not present

## 2016-05-13 DIAGNOSIS — J0141 Acute recurrent pansinusitis: Secondary | ICD-10-CM | POA: Diagnosis not present

## 2016-05-13 MED ORDER — DOXYCYCLINE HYCLATE 100 MG PO CAPS: 100.0000 mg | ORAL_CAPSULE | Freq: Two times a day (BID) | ORAL | 0 refills | Status: DC

## 2016-05-13 MED ORDER — PREDNISONE 10 MG PO TABS: ORAL_TABLET | ORAL | 0 refills | Status: DC

## 2016-05-13 NOTE — Progress Notes (Signed)
Subjective:    Patient ID: Kathy MelnickSusan S Crysler, female    DOB: 1946/01/20, 70 y.o.   MRN: [id number]  HPI  70 year old female who presents to the office today for continued sore throat,sinus pain and pressure.   She has had symptoms since August of this year. She reports having " a steroid shot and antibiotic shot a few months ago" and that " it really helped".   [facility] and recently had [facility] scope? Which showed "inflamed larynx" .   She is taking Nexium for GERD  Review of Systems  Constitutional: Negative.   HENT: Positive for congestion, ear pain, sinus pressure, sore throat, trouble swallowing and voice change. Negative for postnasal drip, rhinorrhea and sneezing.   Respiratory: Positive for cough and chest tightness. Negative for wheezing.   Cardiovascular: Negative.   Neurological: Positive for headaches.   Past Medical History:  Diagnosis Date  . Allergic rhinitis, cause unspecified 05/31/2007  . CERVICAL RADICULOPATHY, RIGHT 01/07/2010  . GERD 07/13/2007    Social History   Social History  . Marital status: Married    Spouse name: N/A  . Number of children: N/A  . Years of education: N/A   Occupational History  . Not on file.   Social History Main Topics  . Smoking status: Never Smoker  . Smokeless tobacco: Never Used  . Alcohol use Not on file  . Drug use: Unknown  . Sexual activity: Not on file   Other Topics Concern  . Not on file   Social History Narrative  . No narrative on file    Past Surgical History:  Procedure Laterality Date  . TUBAL LIGATION      No family history on file.  Allergies  Allergen Reactions  . Erythromycin     REACTION: nausea    Current Outpatient Prescriptions on File Prior to Visit  Medication Sig Dispense Refill  . esomeprazole (NEXIUM) 40 MG capsule Take 40 mg by mouth 2 (two) times daily.      . magic mouthwash w/lidocaine SOLN Take 5 mLs by mouth 3 (three) times daily as needed. 180 mL  0  . Multiple Vitamin (MULTIVITAMIN WITH MINERALS) TABS Take 1 tablet by mouth daily.    . valACYclovir (VALTREX) 1000 MG tablet   0   No current facility-administered medications on file prior to visit.     BP (!) 112/56   Temp 98.1 F (36.7 C) (Oral)   Ht 5\' 4"  (1.626 m)   Wt 172 lb 14.4 oz (78.4 kg)   BMI 29.68 kg/m       Objective:   Physical Exam  Constitutional: She is oriented to person, place, and time. She appears well-nourished. No distress.  HENT:  Head: Normocephalic and atraumatic.  Right Ear: Hearing, tympanic membrane, external ear and ear canal normal.  Left Ear: Hearing, tympanic membrane, external ear and ear canal normal.  Nose: Mucosal edema and rhinorrhea present. Right sinus exhibits maxillary sinus tenderness and frontal sinus tenderness. Left sinus exhibits maxillary sinus tenderness and frontal sinus tenderness.  Mouth/Throat: Uvula is midline and mucous membranes are normal. Oropharyngeal exudate present.  Eyes: Conjunctivae and EOM are normal. Pupils are equal, round, and reactive to light. Right eye exhibits no discharge. No scleral icterus.  Neck: Normal range of motion. Neck supple. No thyromegaly present.  Cardiovascular: Normal rate, regular rhythm, normal heart sounds and intact distal pulses.  Exam reveals no gallop and no friction rub.  No murmur heard. Pulmonary/Chest: Effort normal and breath sounds normal. No respiratory distress. She has no wheezes. She has no rales. She exhibits no tenderness.  Abdominal: Soft. Bowel sounds are normal. She exhibits no distension and no mass. There is no tenderness. There is no rebound and no guarding.  Musculoskeletal: Normal range of motion. She exhibits no edema or deformity.  Lymphadenopathy:    She has no cervical adenopathy.  Neurological: She is alert and oriented to person, place, and time.  Skin: Skin is warm and dry. No rash noted. She is not diaphoretic. No erythema. No pallor.  Psychiatric: She  has a normal mood and affect. Her behavior is normal. Judgment and thought content normal.  Vitals reviewed.     Assessment & Plan:  1. Acute recurrent pansinusitis - predniSONE (DELTASONE) 10 MG tablet; 40 mg x 3 days, 20 mg x 3 days, 10 mg x 3 days  Dispense: 21 tablet; Refill: 0 - doxycycline (VIBRAMYCIN) 100 MG capsule; Take 1 capsule (100 mg total) by mouth 2 (two) times daily.  Dispense: 14 capsule; Refill: 0 - Follow up if no improvement 2. Sore throat - Possibly from PND or acid reflux.  - Will trial prednisone taper - predniSONE (DELTASONE) 10 MG tablet; 40 mg x 3 days, 20 mg x 3 days, 10 mg x 3 days  Dispense: 21 tablet; Refill: 0 - Follow up if no improvement - May need to switch her to Protonix for GERD 3. Need for prophylactic vaccination and inoculation against influenza  - Flu vaccine HIGH DOSE PF (Fluzone High dose)  Shirline Frees, NP

## 2016-08-04 HISTORY — PX: MEDIAL PARTIAL KNEE REPLACEMENT: SHX5965

## 2016-10-08 ENCOUNTER — Encounter: Payer: Self-pay | Admitting: Internal Medicine

## 2016-10-08 ENCOUNTER — Ambulatory Visit (INDEPENDENT_AMBULATORY_CARE_PROVIDER_SITE_OTHER): Payer: BLUE CROSS/BLUE SHIELD | Admitting: Internal Medicine

## 2016-10-08 VITALS — BP 120/78 | HR 79 | Temp 98.4°F | Ht 64.0 in | Wt 180.0 lb

## 2016-10-08 DIAGNOSIS — B9789 Other viral agents as the cause of diseases classified elsewhere: Secondary | ICD-10-CM

## 2016-10-08 DIAGNOSIS — J069 Acute upper respiratory infection, unspecified: Secondary | ICD-10-CM

## 2016-10-08 MED ORDER — HYDROCODONE-HOMATROPINE 5-1.5 MG/5ML PO SYRP: 5.0000 mL | ORAL_SOLUTION | Freq: Four times a day (QID) | ORAL | 0 refills | Status: DC | PRN

## 2016-10-08 NOTE — Progress Notes (Signed)
Subjective:    Patient ID: Kathy Herman, female    DOB: [birth year], 71 y.o.   MRN: [id number]  HPI  71 year old patient with a several day history of cough, sinus and chest congestion.  She has been using Mucinex and expectorating considerable mucus.  Her chief complaint is cough with does interfere with sleep She describes some myalgias and low-grade fever and general sense of unwellness  Past Medical History:  Diagnosis Date  . Allergic rhinitis, cause unspecified 05/31/2007  . CERVICAL RADICULOPATHY, RIGHT 01/07/2010  . GERD 07/13/2007     Social History   Social History  . Marital status: Married    Spouse name: N/A  . Number of children: N/A  . Years of education: N/A   Occupational History  . Not on file.   Social History Main Topics  . Smoking status: Never Smoker  . Smokeless tobacco: Never Used  . Alcohol use Not on file  . Drug use: Unknown  . Sexual activity: Not on file   Other Topics Concern  . Not on file   Social History Narrative  . No narrative on file    Past Surgical History:  Procedure Laterality Date  . TUBAL LIGATION      No family history on file.  Allergies  Allergen Reactions  . Erythromycin     REACTION: nausea    Current Outpatient Prescriptions on File Prior to Visit  Medication Sig Dispense Refill  . esomeprazole (NEXIUM) 40 MG capsule Take 40 mg by mouth 2 (two) times daily.      . Multiple Vitamin (MULTIVITAMIN WITH MINERALS) TABS Take 1 tablet by mouth daily.    . valACYclovir (VALTREX) 1000 MG tablet   0   No current facility-administered medications on file prior to visit.     BP 120/78 (BP Location: Left Arm, Patient Position: Sitting, Cuff Size: Normal)   Pulse 79   Temp 98.4 F (36.9 C) (Oral)   Ht 5\' 4"  (1.626 m)   Wt 180 lb (81.6 kg)   SpO2 96%   BMI 30.90 kg/m     Review of Systems  Constitutional: Positive for activity change, appetite change, fatigue and fever. Negative for chills and diaphoresis.   HENT: Positive for congestion, postnasal drip and rhinorrhea. Negative for dental problem, hearing loss, sinus pressure, sore throat and tinnitus.   Eyes: Negative for pain, discharge and visual disturbance.  Respiratory: Positive for cough. Negative for shortness of breath and wheezing.   Cardiovascular: Negative for chest pain, palpitations and leg swelling.  Gastrointestinal: Negative for abdominal distention, abdominal pain, blood in stool, constipation, diarrhea, nausea and vomiting.  Genitourinary: Negative for difficulty urinating, dysuria, flank pain, frequency, hematuria, pelvic pain, urgency, vaginal bleeding, vaginal discharge and vaginal pain.  Musculoskeletal: Negative for arthralgias, gait problem and joint swelling.  Skin: Negative for rash.  Neurological: Negative for dizziness, syncope, speech difficulty, weakness, numbness and headaches.  Hematological: Negative for adenopathy.  Psychiatric/Behavioral: Negative for agitation, behavioral problems and dysphoric mood. The patient is not nervous/anxious.        Objective:   Physical Exam  Constitutional: She is oriented to person, place, and time. She appears well-developed and well-nourished.  HENT:  Head: Normocephalic.  Right Ear: External ear normal.  Left Ear: External ear normal.  Mouth/Throat: Oropharynx is clear and moist.  Eyes: Conjunctivae and EOM are normal. Pupils are equal, round, and reactive to light.  Neck: Normal range of motion. Neck supple. No thyromegaly present.  Cardiovascular: Normal rate,  regular rhythm, normal heart sounds and intact distal pulses.   Pulmonary/Chest: Effort normal and breath sounds normal. No respiratory distress. She has no wheezes. She has no rales.  Abdominal: Soft. Bowel sounds are normal. She exhibits no mass. There is no tenderness.  Musculoskeletal: Normal range of motion.  Lymphadenopathy:    She has no cervical adenopathy.  Neurological: She is alert and oriented to  person, place, and time.  Skin: Skin is warm and dry. No rash noted.  Psychiatric: She has a normal mood and affect. Her behavior is normal.          Assessment & Plan:   Viral URI with cough.  Will treat symptomatically History of allergic rhinitis  Kathy Herman,Kathy Herman

## 2016-10-08 NOTE — Patient Instructions (Addendum)

## 2016-10-08 NOTE — Progress Notes (Signed)
Pre visit review using our clinic review tool, if applicable. No additional management support is needed unless otherwise documented below in the visit note. 

## 2016-10-28 ENCOUNTER — Ambulatory Visit (INDEPENDENT_AMBULATORY_CARE_PROVIDER_SITE_OTHER): Payer: BLUE CROSS/BLUE SHIELD | Admitting: Internal Medicine

## 2016-10-28 ENCOUNTER — Encounter: Payer: Self-pay | Admitting: Internal Medicine

## 2016-10-28 VITALS — BP 122/78 | HR 80 | Temp 98.6°F | Ht 64.0 in | Wt 178.0 lb

## 2016-10-28 DIAGNOSIS — M5442 Lumbago with sciatica, left side: Secondary | ICD-10-CM | POA: Diagnosis not present

## 2016-10-28 MED ORDER — PREDNISONE 20 MG PO TABS: 20.0000 mg | ORAL_TABLET | Freq: Every day | ORAL | 0 refills | Status: DC

## 2016-10-28 MED ORDER — TRAMADOL HCL 50 MG PO TABS: 50.0000 mg | ORAL_TABLET | Freq: Three times a day (TID) | ORAL | 0 refills | Status: DC | PRN

## 2016-10-28 NOTE — Patient Instructions (Addendum)
This pain acts like what we call radicular pain that is from a pinched nerve from the spine are back area. Plan prednisone taper to decrease the inflammation around the nerve and hopefully help the pain that side. You can take tramadol at night if needed to add on for pain control. Would take 50 mg no more than every 8 hours but mostly at night area you can also take Tylenol 500 mg every 8 hours with it. This may help better. Plan follow-up visit with your primary care doctor in about 2-3 weeks. For reevaluation.     Sciatica Sciatica is pain, numbness, weakness, or tingling along the path of the sciatic nerve. The sciatic nerve starts in the lower back and runs down the back of each leg. The nerve controls the muscles in the lower leg and in the back of the knee. It also provides feeling (sensation) to the back of the thigh, the lower leg, and the sole of the foot. Sciatica is a symptom of another medical condition that pinches or puts pressure on the sciatic nerve. Generally, sciatica only affects one side of the body. Sciatica usually goes away on its own or with treatment. In some cases, sciatica may keep coming back (recur). What are the causes? This condition is caused by pressure on the sciatic nerve, or pinching of the sciatic nerve. This may be the result of:  A disk in between the bones of the spine (vertebrae) bulging out too far (herniated disk).  Age-related changes in the spinal disks (degenerative disk disease).  A pain disorder that affects a muscle in the buttock (piriformis syndrome).  Extra bone growth (bone spur) near the sciatic nerve.  An injury or break (fracture) of the pelvis.  Pregnancy.  Tumor (rare). What increases the risk? The following factors may make you more likely to develop this condition:  Playing sports that place pressure or stress on the spine, such as football or weight lifting.  Having poor strength and flexibility.  A history of back  injury.  A history of back surgery.  Sitting for long periods of time.  Doing activities that involve repetitive bending or lifting.  Obesity. What are the signs or symptoms? Symptoms can vary from mild to very severe, and they may include:  Any of these problems in the lower back, leg, hip, or buttock:  Mild tingling or dull aches.  Burning sensations.  Sharp pains.  Numbness in the back of the calf or the sole of the foot.  Leg weakness.  Severe back pain that makes movement difficult. These symptoms may get worse when you cough, sneeze, or laugh, or when you sit or stand for long periods of time. Being overweight may also make symptoms worse. In some cases, symptoms may recur over time. How is this diagnosed? This condition may be diagnosed based on:  Your symptoms.  A physical exam. Your health care provider may ask you to do certain movements to check whether those movements trigger your symptoms.  You may have tests, including:  Blood tests.  X-rays.  MRI.  CT scan. How is this treated? In many cases, this condition improves on its own, without any treatment. However, treatment may include:  Reducing or modifying physical activity during periods of pain.  Exercising and stretching to strengthen your abdomen and improve the flexibility of your spine.  Icing and applying heat to the affected area.  Medicines that help:  To relieve pain and swelling.  To relax your muscles.  Injections of medicines that help to relieve pain, irritation, and inflammation around the sciatic nerve (steroids).  Surgery. Follow these instructions at home: Medicines   Take over-the-counter and prescription medicines only as told by your health care provider.  Do not drive or operate heavy machinery while taking prescription pain medicine. Managing pain   If directed, apply ice to the affected area.  Put ice in a plastic bag.  Place a towel between your skin and the  bag.  Leave the ice on for 20 minutes, 2-3 times a day.  After icing, apply heat to the affected area before you exercise or as often as told by your health care provider. Use the heat source that your health care provider recommends, such as a moist heat pack or a heating pad.  Place a towel between your skin and the heat source.  Leave the heat on for 20-30 minutes.  Remove the heat if your skin turns bright red. This is especially important if you are unable to feel pain, heat, or cold. You may have a greater risk of getting burned. Activity   Return to your normal activities as told by your health care provider. Ask your health care provider what activities are safe for you.  Avoid activities that make your symptoms worse.  Take brief periods of rest throughout the day. Resting in a lying or standing position is usually better than sitting to rest.  When you rest for longer periods, mix in some mild activity or stretching between periods of rest. This will help to prevent stiffness and pain.  Avoid sitting for long periods of time without moving. Get up and move around at least one time each hour.  Exercise and stretch regularly, as told by your health care provider.  Do not lift anything that is heavier than 10 lb (4.5 kg) while you have symptoms of sciatica. When you do not have symptoms, you should still avoid heavy lifting, especially repetitive heavy lifting.  When you lift objects, always use proper lifting technique, which includes:  Bending your knees.  Keeping the load close to your body.  Avoiding twisting. General instructions   Use good posture.  Avoid leaning forward while sitting.  Avoid hunching over while standing.  Maintain a healthy weight. Excess weight puts extra stress on your back and makes it difficult to maintain good posture.  Wear supportive, comfortable shoes. Avoid wearing high heels.  Avoid sleeping on a mattress that is too soft or too  hard. A mattress that is firm enough to support your back when you sleep may help to reduce your pain.  Keep all follow-up visits as told by your health care provider. This is important. Contact a health care provider if:  You have pain that wakes you up when you are sleeping.  You have pain that gets worse when you lie down.  Your pain is worse than you have experienced in the past.  Your pain lasts longer than 4 weeks.  You experience unexplained weight loss. Get help right away if:  You lose control of your bowel or bladder (incontinence).  You have:  Weakness in your lower back, pelvis, buttocks, or legs that gets worse.  Redness or swelling of your back.  A burning sensation when you urinate. This information is not intended to replace advice given to you by your health care provider. Make sure you discuss any questions you have with your health care provider. Document Released: 07/15/2001 Document Revised: 12/25/2015 Document Reviewed:  03/30/2015 Elsevier Interactive Patient Education  2017 [employer].

## 2016-10-28 NOTE — Progress Notes (Signed)
Chief Complaint  Patient presents with  . Leg Pain    pain from rear end down leg(left )  . Cough    HPI: Kathy Herman 71 y.o.  SDA  PCP NA today  Has had coughing illness weeks ago treated with cough medicine and Mucinex DM that has since gotten better. However about 2 weeks ago she had the insidious onset of left lower back buttock pain radiating down the back of her leg it is worse with coughing and bending certain ways. Sitting is more uncomfortable walking is better but hurts all the time she's been taking ibuprofen good bit around-the-clock but it is still persisting. Denies any weakness total numbness bowel or bladder changes or fever. Her cough is getting better. No swelling no history of same. Remote history of radicular symptoms in the neck that were related to an overuse type of injury that resolved on its and with physical therapy.  ROS: See pertinent positives and negatives per HPI. No hx cancer  nohx of same back problem in  Past no fever   Past Medical History:  Diagnosis Date  . Allergic rhinitis, cause unspecified 05/31/2007  . CERVICAL RADICULOPATHY, RIGHT 01/07/2010  . GERD 07/13/2007    No family history on file.  Social History   Social History  . Marital status: Married    Spouse name: N/A  . Number of children: N/A  . Years of education: N/A   Social History Main Topics  . Smoking status: Never Smoker  . Smokeless tobacco: Never Used  . Alcohol use None  . Drug use: Unknown  . Sexual activity: Not Asked   Other Topics Concern  . None   Social History Narrative  . None    Outpatient Medications Prior to Visit  Medication Sig Dispense Refill  . esomeprazole (NEXIUM) 40 MG capsule Take 40 mg by mouth 2 (two) times daily.      . Multiple Vitamin (MULTIVITAMIN WITH MINERALS) TABS Take 1 tablet by mouth daily.    . valACYclovir (VALTREX) 1000 MG tablet   0  . HYDROcodone-homatropine (HYCODAN) 5-1.5 MG/5ML syrup Take 5 mLs by mouth every 6 (six)  hours as needed for cough. 120 mL 0   No facility-administered medications prior to visit.      EXAM:  BP 122/78 (BP Location: Left Arm, Patient Position: Standing, Cuff Size: Normal)   Pulse 80   Temp 98.6 F (37 C) (Oral)   Ht 5\' 4"  (1.626 m)   Wt 178 lb (80.7 kg)   SpO2 95%   BMI 30.55 kg/m   Body mass index is 30.55 kg/m.  GENERAL: vitals reviewed and listed above, alert, oriented, appears well hydrated and in no acute distress HEENT: atraumatic, conjunctiva  clear, no obvious abnormalities on inspection of external nose and earsNECK: no obvious masses on inspection palpation  Back no point tenderness but some discomfort in the lower LS spine left buttocks area SI area appears normal. DTRs present question Achilles asymmetry positive SLR toe heel walk is normal without obvious drainage reduction. Moderate pain during exam prefers to stand. No unusual rashes. MS: moves all extremities without noticeable focal  abnormality PSYCH: pleasant and cooperative, no obvious depression or anxiety   ASSESSMENT AND PLAN:  Discussed the following assessment and plan:  Acute left-sided low back pain with left-sided sciatica   Expectant management.  Advise fu with pcp because of severity  And  Presentation no  Other alarm sx disc   taramadol causes drowsiness  but can add on  With tylenol if needed  -Patient advised to return or notify health care team  if symptoms worsen ,persist or new concerns arise.  Patient Instructions  This pain acts like what we call radicular pain that is from a pinched nerve from the spine are back area. Plan prednisone taper to decrease the inflammation around the nerve and hopefully help the pain that side. You can take tramadol at night if needed to add on for pain control. Would take 50 mg no more than every 8 hours but mostly at night area you can also take Tylenol 500 mg every 8 hours with it. This may help better. Plan follow-up visit with your primary  care doctor in about 2-3 weeks. For reevaluation.     Sciatica Sciatica is pain, numbness, weakness, or tingling along the path of the sciatic nerve. The sciatic nerve starts in the lower back and runs down the back of each leg. The nerve controls the muscles in the lower leg and in the back of the knee. It also provides feeling (sensation) to the back of the thigh, the lower leg, and the sole of the foot. Sciatica is a symptom of another medical condition that pinches or puts pressure on the sciatic nerve. Generally, sciatica only affects one side of the body. Sciatica usually goes away on its own or with treatment. In some cases, sciatica may keep coming back (recur). What are the causes? This condition is caused by pressure on the sciatic nerve, or pinching of the sciatic nerve. This may be the result of:  A disk in between the bones of the spine (vertebrae) bulging out too far (herniated disk).  Age-related changes in the spinal disks (degenerative disk disease).  A pain disorder that affects a muscle in the buttock (piriformis syndrome).  Extra bone growth (bone spur) near the sciatic nerve.  An injury or break (fracture) of the pelvis.  Pregnancy.  Tumor (rare). What increases the risk? The following factors may make you more likely to develop this condition:  Playing sports that place pressure or stress on the spine, such as football or weight lifting.  Having poor strength and flexibility.  A history of back injury.  A history of back surgery.  Sitting for long periods of time.  Doing activities that involve repetitive bending or lifting.  Obesity. What are the signs or symptoms? Symptoms can vary from mild to very severe, and they may include:  Any of these problems in the lower back, leg, hip, or buttock:  Mild tingling or dull aches.  Burning sensations.  Sharp pains.  Numbness in the back of the calf or the sole of the foot.  Leg weakness.  Severe  back pain that makes movement difficult. These symptoms may get worse when you cough, sneeze, or laugh, or when you sit or stand for long periods of time. Being overweight may also make symptoms worse. In some cases, symptoms may recur over time. How is this diagnosed? This condition may be diagnosed based on:  Your symptoms.  A physical exam. Your health care provider may ask you to do certain movements to check whether those movements trigger your symptoms.  You may have tests, including:  Blood tests.  X-rays.  MRI.  CT scan. How is this treated? In many cases, this condition improves on its own, without any treatment. However, treatment may include:  Reducing or modifying physical activity during periods of pain.  Exercising and stretching to strengthen  your abdomen and improve the flexibility of your spine.  Icing and applying heat to the affected area.  Medicines that help:  To relieve pain and swelling.  To relax your muscles.  Injections of medicines that help to relieve pain, irritation, and inflammation around the sciatic nerve (steroids).  Surgery. Follow these instructions at home: Medicines   Take over-the-counter and prescription medicines only as told by your health care provider.  Do not drive or operate heavy machinery while taking prescription pain medicine. Managing pain   If directed, apply ice to the affected area.  Put ice in a plastic bag.  Place a towel between your skin and the bag.  Leave the ice on for 20 minutes, 2-3 times a day.  After icing, apply heat to the affected area before you exercise or as often as told by your health care provider. Use the heat source that your health care provider recommends, such as a moist heat pack or a heating pad.  Place a towel between your skin and the heat source.  Leave the heat on for 20-30 minutes.  Remove the heat if your skin turns bright red. This is especially important if you are unable  to feel pain, heat, or cold. You may have a greater risk of getting burned. Activity   Return to your normal activities as told by your health care provider. Ask your health care provider what activities are safe for you.  Avoid activities that make your symptoms worse.  Take brief periods of rest throughout the day. Resting in a lying or standing position is usually better than sitting to rest.  When you rest for longer periods, mix in some mild activity or stretching between periods of rest. This will help to prevent stiffness and pain.  Avoid sitting for long periods of time without moving. Get up and move around at least one time each hour.  Exercise and stretch regularly, as told by your health care provider.  Do not lift anything that is heavier than 10 lb (4.5 kg) while you have symptoms of sciatica. When you do not have symptoms, you should still avoid heavy lifting, especially repetitive heavy lifting.  When you lift objects, always use proper lifting technique, which includes:  Bending your knees.  Keeping the load close to your body.  Avoiding twisting. General instructions   Use good posture.  Avoid leaning forward while sitting.  Avoid hunching over while standing.  Maintain a healthy weight. Excess weight puts extra stress on your back and makes it difficult to maintain good posture.  Wear supportive, comfortable shoes. Avoid wearing high heels.  Avoid sleeping on a mattress that is too soft or too hard. A mattress that is firm enough to support your back when you sleep may help to reduce your pain.  Keep all follow-up visits as told by your health care provider. This is important. Contact a health care provider if:  You have pain that wakes you up when you are sleeping.  You have pain that gets worse when you lie down.  Your pain is worse than you have experienced in the past.  Your pain lasts longer than 4 weeks.  You experience unexplained weight  loss. Get help right away if:  You lose control of your bowel or bladder (incontinence).  You have:  Weakness in your lower back, pelvis, buttocks, or legs that gets worse.  Redness or swelling of your back.  A burning sensation when you urinate. This information is  not intended to replace advice given to you by your health care provider. Make sure you discuss any questions you have with your health care provider. Document Released: 07/15/2001 Document Revised: 12/25/2015 Document Reviewed: 03/30/2015 Elsevier Interactive Patient Education  2017 [employer]     RobinsonWanda K. Bilal Manzer M.D.

## 2016-11-13 ENCOUNTER — Encounter: Payer: Self-pay | Admitting: Internal Medicine

## 2016-11-13 ENCOUNTER — Ambulatory Visit (INDEPENDENT_AMBULATORY_CARE_PROVIDER_SITE_OTHER): Payer: BLUE CROSS/BLUE SHIELD | Admitting: Internal Medicine

## 2016-11-13 VITALS — BP 118/66 | HR 83 | Temp 98.5°F | Ht 64.0 in | Wt 177.0 lb

## 2016-11-13 DIAGNOSIS — M5416 Radiculopathy, lumbar region: Secondary | ICD-10-CM

## 2016-11-13 NOTE — Patient Instructions (Addendum)
Lumbosacral Radiculopathy Lumbosacral radiculopathy is a condition that involves the spinal nerves and nerve roots in the low back and bottom of the spine. The condition develops when these nerves and nerve roots move out of place or become inflamed and cause symptoms. What are the causes? This condition may be caused by:  Pressure from a disk that bulges out of place (herniated disk). A disk is a plate of cartilage that separates bones in the spine.  Disk degeneration.  A narrowing of the bones of the lower back (spinal stenosis).  A tumor.  An infection.  An injury that places sudden pressure on the disks that cushion the bones of your lower spine. What increases the risk? This condition is more likely to develop in:  Males aged 30-50 years.  Females aged 50-60 years.  People who lift improperly.  People who are overweight or live a sedentary lifestyle.  People who smoke.  People who perform repetitive activities that strain the spine. What are the signs or symptoms? Symptoms of this condition include:  Pain that goes down from the back into the legs (sciatica). This is the most common symptom. The pain may be worse with sitting, coughing, or sneezing.  Pain and numbness in the arms and legs.  Muscle weakness.  Tingling.  Loss of bladder control or bowel control. How is this diagnosed? This condition is diagnosed with a physical exam and medical history. If the pain is lasting, you may have tests, such as:  MRI scan.  X-ray.  CT scan.  Myelogram.  Nerve conduction study. How is this treated? This condition is often treated with:  Hot packs and ice applied to affected areas.  Stretches to improve flexibility.  Exercises to strengthen back muscles.  Physical therapy.  Pain medicine.  A steroid injection in the spine. In some cases, no treatment is needed. If the condition is long-lasting (chronic), or if symptoms are severe, treatment may involve  surgery or lifestyle changes, such as following a weight loss plan. Follow these instructions at home: Medicines   Take medicines only as directed by your health care provider.  Do not drive or operate heavy machinery while taking pain medicine. Injury care   Apply a heat pack to the injured area as directed by your health care provider.  Apply ice to the affected area:  Put ice in a plastic bag.  Place a towel between your skin and the bag.  Leave the ice on for 20-30 minutes, every 2 hours while you are awake or as needed. Or, leave the ice on for as long as directed by your health care provider. Other Instructions   If you were shown how to do any exercises or stretches, do them as directed by your health care provider.  If your health care provider prescribed a diet or exercise program, follow it as directed.  Keep all follow-up visits as directed by your health care provider. This is important. Contact a health care provider if:  Your pain does not improve over time even when taking pain medicines. Get help right away if:  Your develop severe pain.  Your pain suddenly gets worse.  You develop increasing weakness in your legs.  You lose the ability to control your bladder or bowel.  You have difficulty walking or balancing.  You have a fever. This information is not intended to replace advice given to you by your health care provider. Make sure you discuss any questions you have with your health care   provider. Document Released: 07/21/2005 Document Revised: 12/27/2015 Document Reviewed: 07/17/2014 Elsevier Interactive Patient Education  2017 [employer]  

## 2016-11-13 NOTE — Progress Notes (Signed)
Subjective:    Patient ID: Kathy Herman, female    DOB: [birth year], 71 y.o.   MRN: [id number]  HPI  71 year old patient who was initially seen last month with left-sided sciatica.  She was treated with oral prednisone and remains symptomatic.  She has been followed also by [facility] orthopedics who recently ordered a lumbar MRI due to severe and ongoing symptoms.  This revealed a left S1 HNP and she has been scheduled for physical therapy and also to see Dr. Ethelene Hal  for consideration of an epidural. She has also been seen by Dr. Shelle Iron who continues to follow.  Past Medical History:  Diagnosis Date  . Allergic rhinitis, cause unspecified 05/31/2007  . CERVICAL RADICULOPATHY, RIGHT 01/07/2010  . GERD 07/13/2007     Social History   Social History  . Marital status: Married    Spouse name: N/A  . Number of children: N/A  . Years of education: N/A   Occupational History  . Not on file.   Social History Main Topics  . Smoking status: Never Smoker  . Smokeless tobacco: Never Used  . Alcohol use Not on file  . Drug use: Unknown  . Sexual activity: Not on file   Other Topics Concern  . Not on file   Social History Narrative  . No narrative on file    Past Surgical History:  Procedure Laterality Date  . TUBAL LIGATION      No family history on file.  Allergies  Allergen Reactions  . Erythromycin     REACTION: nausea    Current Outpatient Prescriptions on File Prior to Visit  Medication Sig Dispense Refill  . esomeprazole (NEXIUM) 40 MG capsule Take 40 mg by mouth 2 (two) times daily.      Marland Kitchen FINASTERIDE PO Take by mouth.    . Multiple Vitamin (MULTIVITAMIN WITH MINERALS) TABS Take 1 tablet by mouth daily.    . predniSONE (DELTASONE) 20 MG tablet Take 1 tablet (20 mg total) by mouth daily. Take 3,3,3,2,2,2,1,1,1, 1/.2 1./2 1/.2 pills qd 24 tablet 0  . traMADol (ULTRAM) 50 MG tablet Take 1 tablet (50 mg total) by mouth every 8 (eight) hours as needed. May take with  tylenol 500 mg 24 tablet 0  . valACYclovir (VALTREX) 1000 MG tablet   0   No current facility-administered medications on file prior to visit.     BP 118/66 (BP Location: Left Arm, Patient Position: Sitting, Cuff Size: Normal)   Pulse 83   Temp 98.5 F (36.9 C) (Oral)   Ht  (1.626 m)   Wt 177 lb (80.3 kg)   SpO2 97%   BMI 30.38 kg/m     Review of Systems  Constitutional: Negative.   HENT: Negative for congestion, dental problem, hearing loss, rhinorrhea, sinus pressure, sore throat and tinnitus.   Eyes: Negative for pain, discharge and visual disturbance.  Respiratory: Negative for cough and shortness of breath.   Cardiovascular: Negative for chest pain, palpitations and leg swelling.  Gastrointestinal: Negative for abdominal distention, abdominal pain, blood in stool, constipation, diarrhea, nausea and vomiting.  Genitourinary: Negative for difficulty urinating, dysuria, flank pain, frequency, hematuria, pelvic pain, urgency, vaginal bleeding, vaginal discharge and vaginal pain.  Musculoskeletal: Negative for arthralgias, gait problem and joint swelling.  Skin: Negative for rash.  Neurological: Negative for dizziness, syncope, speech difficulty, weakness, numbness and headaches.       Left sciatica  Hematological: Negative for adenopathy.  Psychiatric/Behavioral: Negative for agitation, behavioral problems and  dysphoric mood. The patient is not nervous/anxious.        Objective:   Physical Exam  Constitutional: She is oriented to person, place, and time. She appears well-developed and well-nourished. No distress.  Neurological: She is oriented to person, place, and time. She has normal reflexes.  Able to walk on toes and heels Reflexes symmetrical          Assessment & Plan:   Left S1 radiculopathy.  Discussed at length with the patient.  She is comfortable with physical therapy, consideration for a epidural.  Follow Dr. Shelle Iron.  Rogelia Boga

## 2016-11-13 NOTE — Progress Notes (Signed)
Pre visit review using our clinic review tool, if applicable. No additional management support is needed unless otherwise documented below in the visit note. 

## 2016-12-26 ENCOUNTER — Ambulatory Visit
Admission: RE | Admit: 2016-12-26 | Discharge: 2016-12-26 | Disposition: A | Discharge status: Home / Self Care | Payer: BLUE CROSS/BLUE SHIELD | Source: Ambulatory Visit | Attending: Orthopedic Surgery | Admitting: Orthopedic Surgery

## 2016-12-26 ENCOUNTER — Other Ambulatory Visit: Payer: Self-pay | Admitting: Orthopedic Surgery

## 2016-12-26 DIAGNOSIS — M1711 Unilateral primary osteoarthritis, right knee: Secondary | ICD-10-CM

## 2016-12-31 ENCOUNTER — Ambulatory Visit
Admission: RE | Admit: 2016-12-31 | Discharge: 2016-12-31 | Disposition: A | Discharge status: Home / Self Care | Payer: BLUE CROSS/BLUE SHIELD | Source: Ambulatory Visit | Attending: Orthopedic Surgery | Admitting: Orthopedic Surgery

## 2016-12-31 DIAGNOSIS — M1711 Unilateral primary osteoarthritis, right knee: Secondary | ICD-10-CM

## 2017-01-01 ENCOUNTER — Other Ambulatory Visit: Payer: Self-pay | Admitting: Orthopedic Surgery

## 2017-01-01 DIAGNOSIS — M1711 Unilateral primary osteoarthritis, right knee: Secondary | ICD-10-CM

## 2017-04-23 ENCOUNTER — Encounter: Payer: Self-pay | Admitting: Internal Medicine

## 2017-06-29 ENCOUNTER — Other Ambulatory Visit: Payer: Self-pay | Admitting: Obstetrics and Gynecology

## 2017-06-29 DIAGNOSIS — Z1231 Encounter for screening mammogram for malignant neoplasm of breast: Secondary | ICD-10-CM

## 2017-07-01 ENCOUNTER — Other Ambulatory Visit: Payer: Self-pay | Admitting: Obstetrics and Gynecology

## 2017-07-01 DIAGNOSIS — Z780 Asymptomatic menopausal state: Secondary | ICD-10-CM

## 2017-07-01 DIAGNOSIS — M8580 Other specified disorders of bone density and structure, unspecified site: Secondary | ICD-10-CM

## 2017-07-31 ENCOUNTER — Ambulatory Visit
Admission: RE | Admit: 2017-07-31 | Discharge: 2017-07-31 | Disposition: A | Discharge status: Home / Self Care | Payer: BLUE CROSS/BLUE SHIELD | Source: Ambulatory Visit | Attending: Obstetrics and Gynecology | Admitting: Obstetrics and Gynecology

## 2017-07-31 DIAGNOSIS — Z1231 Encounter for screening mammogram for malignant neoplasm of breast: Secondary | ICD-10-CM

## 2017-08-20 LAB — HM DEXA SCAN

## 2017-08-25 ENCOUNTER — Encounter: Payer: Self-pay | Admitting: Internal Medicine

## 2017-08-25 ENCOUNTER — Ambulatory Visit (INDEPENDENT_AMBULATORY_CARE_PROVIDER_SITE_OTHER): Payer: BLUE CROSS/BLUE SHIELD | Admitting: Internal Medicine

## 2017-08-25 VITALS — BP 118/60 | HR 77 | Temp 97.9°F | Ht 64.0 in | Wt 179.0 lb

## 2017-08-25 DIAGNOSIS — Z0000 Encounter for general adult medical examination without abnormal findings: Secondary | ICD-10-CM

## 2017-08-25 DIAGNOSIS — K219 Gastro-esophageal reflux disease without esophagitis: Secondary | ICD-10-CM | POA: Diagnosis not present

## 2017-08-25 LAB — LIPID PANEL
CHOL/HDL RATIO: 3
CHOLESTEROL: 173 mg/dL (ref 0–200)
HDL: 52.7 mg/dL (ref >39.00)
LDL Cholesterol: 104 mg/dL — ABNORMAL HIGH (ref 0–99)
NonHDL: 120.11
TRIGLYCERIDES: 83 mg/dL (ref 0.0–149.0)
VLDL: 16.6 mg/dL (ref 0.0–40.0)

## 2017-08-25 LAB — COMPREHENSIVE METABOLIC PANEL
ALBUMIN: 4.3 g/dL (ref 3.5–5.2)
ALK PHOS: 90 U/L (ref 39–117)
ALT: 30 U/L (ref 0–35)
AST: 24 U/L (ref 0–37)
BILIRUBIN TOTAL: 0.6 mg/dL (ref 0.2–1.2)
BUN: 13 mg/dL (ref 6–23)
CALCIUM: 10.4 mg/dL (ref 8.4–10.5)
CO2: 27 meq/L (ref 19–32)
CREATININE: 0.61 mg/dL (ref 0.40–1.20)
Chloride: 106 mEq/L (ref 96–112)
GFR: 102.64 mL/min (ref >60.00)
Glucose, Bld: 137 mg/dL — ABNORMAL HIGH (ref 70–99)
Potassium: 3.9 mEq/L (ref 3.5–5.1)
Sodium: 139 mEq/L (ref 135–145)
TOTAL PROTEIN: 6.5 g/dL (ref 6.0–8.3)

## 2017-08-25 LAB — CBC WITH DIFFERENTIAL/PLATELET
BASOS ABS: 0.1 10*3/uL (ref 0.0–0.1)
Basophils Relative: 1.2 % (ref 0.0–3.0)
EOS ABS: 0.2 10*3/uL (ref 0.0–0.7)
Eosinophils Relative: 2.8 % (ref 0.0–5.0)
HEMATOCRIT: 41.6 % (ref 36.0–46.0)
Hemoglobin: 14.2 g/dL (ref 12.0–15.0)
LYMPHS PCT: 26.2 % (ref 12.0–46.0)
Lymphs Abs: 1.9 10*3/uL (ref 0.7–4.0)
MCHC: 34.1 g/dL (ref 30.0–36.0)
MCV: 88.8 fl (ref 78.0–100.0)
MONOS PCT: 6.9 % (ref 3.0–12.0)
Monocytes Absolute: 0.5 10*3/uL (ref 0.1–1.0)
NEUTROS ABS: 4.5 10*3/uL (ref 1.4–7.7)
Neutrophils Relative %: 62.9 % (ref 43.0–77.0)
PLATELETS: 265 10*3/uL (ref 150.0–400.0)
RBC: 4.68 Mil/uL (ref 3.87–5.11)
RDW: 13.2 % (ref 11.5–15.5)
WBC: 7.2 10*3/uL (ref 4.0–10.5)

## 2017-08-25 LAB — TSH: TSH: 3.15 u[IU]/mL (ref 0.35–4.50)

## 2017-08-25 NOTE — Progress Notes (Signed)
Subjective:    Patient ID: Kathy Herman, female    DOB: 1946-05-01, 72 y.o.   MRN: [id number]  HPI    72 year old patient who is seen today for a wellness exam.  She was seen by gynecology last month and did have a follow-up bone density study and mammogram.  She is status post right partial knee replacement surgery in July of last year.  Still having some right knee discomfort and soft tissue swelling.  Colonoscopy 2012  She did have an epidural in April of last year due to a lumbar radiculopathy.  Presently doing well She has chronic gastroesophageal reflux disease and remains on PPI therapy.  No other concerns or complaints    Current Allergies:  ! ERYTHROMYCIN (ERYTHROMYCIN)  Past Medical History:  Allergic rhinitis  history acute right C7 cervical radiculopathy  gravida two, para two,  Past Surgical History:  tubal ligation   Family history father died at 52 complications of heart failure. Mother died at 66 with history of dementia after complications from a traumatic fall. One brother 2 sisters in good health  Past Medical History:  Diagnosis Date  . Allergic rhinitis, cause unspecified 05/31/2007  . CERVICAL RADICULOPATHY, RIGHT 01/07/2010  . GERD 07/13/2007     Social History   Socioeconomic History  . Marital status: Married    Spouse name: Not on file  . Number of children: Not on file  . Years of education: Not on file  . Highest education level: Not on file  Social Needs  . Financial resource strain: Not on file  . Food insecurity - worry: Not on file  . Food insecurity - inability: Not on file  . Transportation needs - medical: Not on file  . Transportation needs - non-medical: Not on file  Occupational History  . Not on file  Tobacco Use  . Smoking status: Never Smoker  . Smokeless tobacco: Never Used  Substance and Sexual Activity  . Alcohol use: Not on file  . Drug use: Not on file  . Sexual activity: Not on file  Other Topics Concern  .  Not on file  Social History Narrative  . Not on file    Past Surgical History:  Procedure Laterality Date  . TUBAL LIGATION      History reviewed. No pertinent family history.  Allergies  Allergen Reactions  . Erythromycin     REACTION: nausea    Current Outpatient Medications on File Prior to Visit  Medication Sig Dispense Refill  . esomeprazole (NEXIUM) 40 MG capsule Take 40 mg by mouth 2 (two) times daily.      Marland Kitchen FINASTERIDE PO Take by mouth.    . Multiple Vitamin (MULTIVITAMIN WITH MINERALS) TABS Take 1 tablet by mouth daily.    . valACYclovir (VALTREX) 1000 MG tablet   0  . Vitamin D, Cholecalciferol, 1000 units TABS Take 1,000 Units by mouth 2 (two) times daily.     No current facility-administered medications on file prior to visit.     BP 118/60 (BP Location: Left Arm, Patient Position: Sitting, Cuff Size: Normal)   Pulse 77   Temp 97.9 F (36.6 C) (Oral)   Ht 5\' 4"  (1.626 m)   Wt 179 lb (81.2 kg)   SpO2 98%   BMI 30.73 kg/m    Review of Systems  Constitutional: Negative for appetite change, fatigue, fever and unexpected weight change.  HENT: Negative for congestion, dental problem, ear pain, hearing loss, mouth sores, nosebleeds, sinus pressure,  sore throat, tinnitus, trouble swallowing and voice change.   Eyes: Negative for photophobia, pain, redness and visual disturbance.  Respiratory: Negative for cough, chest tightness and shortness of breath.   Cardiovascular: Negative for chest pain, palpitations and leg swelling.  Gastrointestinal: Negative for abdominal distention, abdominal pain, blood in stool, constipation, diarrhea, nausea, rectal pain and vomiting.  Genitourinary: Negative for difficulty urinating, dysuria, flank pain, frequency, genital sores, hematuria, menstrual problem, pelvic pain, urgency, vaginal bleeding, vaginal discharge and vaginal pain.  Musculoskeletal: Positive for arthralgias, gait problem and joint swelling. Negative for back pain  and neck stiffness.  Skin: Negative for rash.  Neurological: Negative for dizziness, syncope, speech difficulty, weakness, light-headedness, numbness and headaches.  Hematological: Negative for adenopathy. Does not bruise/bleed easily.  Psychiatric/Behavioral: Negative for agitation, behavioral problems, dysphoric mood, self-injury and suicidal ideas. The patient is not nervous/anxious.        Objective:   Physical Exam  Constitutional: She is oriented to person, place, and time. She appears well-developed and well-nourished.  HENT:  Head: Normocephalic and atraumatic.  Right Ear: External ear normal.  Left Ear: External ear normal.  Mouth/Throat: Oropharynx is clear and moist.  Eyes: Conjunctivae and EOM are normal.  Neck: Normal range of motion. Neck supple. No JVD present. No thyromegaly present.  Cardiovascular: Normal rate, regular rhythm, normal heart sounds and intact distal pulses.  No murmur heard. Pulmonary/Chest: Effort normal and breath sounds normal. She has no wheezes. She has no rales.  Abdominal: Soft. Bowel sounds are normal. She exhibits no distension and no mass. There is no tenderness. There is no rebound and no guarding.  Musculoskeletal: Normal range of motion. She exhibits no edema or tenderness.  Status post right knee surgery Minimal swelling right ankle  Neurological: She is alert and oriented to person, place, and time. She has normal reflexes. No cranial nerve deficit. She exhibits normal muscle tone. Coordination normal.  Skin: Skin is warm and dry. No rash noted.  Psychiatric: She has a normal mood and affect. Her behavior is normal.          Assessment & Plan:   Preventive health examination gastroesophageal reflux disease  Status post right partial knee replacement surgery Osteopenia.  Continue calcium and vitamin D supplements Increase exercise regimen  Follow-up 1 year Review updated lab  Rogelia BogaKWIATKOWSKI,Kaden Dunkel FRANK

## 2017-08-25 NOTE — Patient Instructions (Addendum)
Calcium Intake Recommendations Calcium is a mineral that affects many functions in the body, including:  Blood clotting.  Blood vessel function.  Nerve impulse conduction.  Hormone secretion.  Muscle contraction.  Bone and teeth functions.  Most of your body's calcium supply is stored in your bones and teeth. When your calcium stores are low, you may be at risk for low bone mass, bone loss, and bone fractures. Consuming enough calcium helps to grow healthy bones and teeth and to prevent breakdown over time. It is very important that you get enough calcium if you are:  A child undergoing rapid growth.  An adolescent girl.  A pre- or post-menopausal woman.  A woman whose menstrual cycle has stopped due to anorexia nervosa or regular intense exercise.  An individual with lactose intolerance or a milk allergy.  A vegetarian.  What is my plan? Try to consume the recommended amount of calcium daily based on your age. Depending on your overall health, your health care provider may recommend increased calcium intake.General daily calcium intake recommendations by age are:  Birth to 6 months: 200 mg.  Infants 7 to 12 months: 260 mg.  Children 1 to 3 years: 700 mg.  Children 4 to 8 years: 1,000 mg.  Children 9 to 13 years: 1,300 mg.  Teens 14 to 18 years: 1,300 mg.  Adults 19 to 50 years: 1,000 mg.  Adult women 51 to 70 years: 1,200 mg.  Adult men 51 to 70 years: 1,000 mg.  Adults 71 years and older: 1,200 mg.  Pregnant and breastfeeding teens: 1,300 mg.  Pregnant and breastfeeding adults: 1,000 mg.  What do I need to know about calcium intake?  In order for the body to absorb calcium, it needs vitamin D. You can get vitamin D through: ? Direct exposure of the skin to sunlight. ? Foods, such as egg yolks, liver, saltwater fish, and fortified milk. ? Supplements.  Consuming too much calcium may cause: ? Constipation. ? Decreased absorption of iron and  zinc. ? Kidney stones.  Calcium supplements may interact with certain medicines. Check with your health care provider before starting any calcium supplements.  Try to get most of your calcium from food. What foods can I eat? Grains  Fortified oatmeal. Fortified ready-to-eat cereals. Fortified frozen waffles. Vegetables Turnip greens. Broccoli. Fruits Fortified orange juice. Meats and Other Protein Sources Canned sardines with bones. Canned salmon with bones. Soy beans. Tofu. Baked beans. Almonds. [place] nuts. Sunflower seeds. Dairy Milk. Yogurt. Cheese. Cottage cheese. Beverages Fortified soy milk. Fortified rice milk. Sweets/Desserts Pudding. Ice Cream. Milkshakes. Blackstrap molasses. The items listed above may not be a complete list of recommended foods or beverages. Contact your dietitian for more options. What foods can affect my calcium intake? It may be more difficult for your body to use calcium or calcium may leave your body more quickly if you consume large amounts of:  Sodium.  Protein.  Caffeine.  Alcohol.  This information is not intended to replace advice given to you by your health care provider. Make sure you discuss any questions you have with your health care provider. Document Released: 03/04/2004 Document Revised: 02/08/2016 Document Reviewed: 12/27/2013 Elsevier Interactive Patient Education  2018 [place] refers to food and lifestyle choices that are based on the traditions of countries located on the [employer]. This way of eating has been shown to help prevent certain conditions and improve outcomes for people who have chronic diseases, like kidney  disease and heart disease. What are tips for following this plan? Lifestyle  Cook and eat meals together with your family, when possible.  Drink enough fluid to keep your urine clear or pale yellow.  Be physically active every day. This  includes: ? Aerobic exercise like running or swimming. ? Leisure activities like gardening, walking, or housework.  Get 7-8 hours of sleep each night.  If recommended by your health care provider, drink red wine in moderation. This means 1 glass a day for nonpregnant women and 2 glasses a day for men. A glass of wine equals 5 oz (150 mL). Reading food labels  Check the serving size of packaged foods. For foods such as rice and pasta, the serving size refers to the amount of cooked product, not dry.  Check the total fat in packaged foods. Avoid foods that have saturated fat or trans fats.  Check the ingredients list for added sugars, such as corn syrup. Shopping  At the grocery store, buy most of your food from the areas near the walls of the store. This includes: ? Fresh fruits and vegetables (produce). ? Grains, beans, nuts, and seeds. Some of these may be available in unpackaged forms or large amounts (in bulk). ? Fresh seafood. ? Poultry and eggs. ? Low-fat dairy products.  Buy whole ingredients instead of prepackaged foods.  Buy fresh fruits and vegetables in-season from local farmers markets.  Buy frozen fruits and vegetables in resealable bags.  If you do not have access to quality fresh seafood, buy precooked frozen shrimp or canned fish, such as tuna, salmon, or sardines.  Buy small amounts of raw or cooked vegetables, salads, or olives from the deli or salad bar at your store.  Stock your pantry so you always have certain foods on hand, such as olive oil, canned tuna, canned tomatoes, rice, pasta, and beans. Cooking  Cook foods with extra-virgin olive oil instead of using butter or other vegetable oils.  Have meat as a side dish, and have vegetables or grains as your main dish. This means having meat in small portions or adding small amounts of meat to foods like pasta or stew.  Use beans or vegetables instead of meat in common dishes like chili or  lasagna.  Experiment with different cooking methods. Try roasting or broiling vegetables instead of steaming or sauteing them.  Add frozen vegetables to soups, stews, pasta, or rice.  Add nuts or seeds for added healthy fat at each meal. You can add these to yogurt, salads, or vegetable dishes.  Marinate fish or vegetables using olive oil, lemon juice, garlic, and fresh herbs. Meal planning  Plan to eat 1 vegetarian meal one day each week. Try to work up to 2 vegetarian meals, if possible.  Eat seafood 2 or more times a week.  Have healthy snacks readily available, such as: ? Vegetable sticks with hummus. ? [place] yogurt. ? Fruit and nut trail mix.  Eat balanced meals throughout the week. This includes: ? Fruit: 2-3 servings a day ? Vegetables: 4-5 servings a day ? Low-fat dairy: 2 servings a day ? Fish, poultry, or lean meat: 1 serving a day ? Beans and legumes: 2 or more servings a week ? Nuts and seeds: 1-2 servings a day ? Whole grains: 6-8 servings a day ? Extra-virgin olive oil: 3-4 servings a day  Limit red meat and sweets to only a few servings a month What are my food choices?  Mediterranean diet ? Recommended ? Grains:  Whole-grain pasta. Brown rice. Bulgar wheat. Polenta. Couscous. Whole-wheat bread. [place]. ? Vegetables: Artichokes. Beets. Broccoli. Cabbage. Carrots. Eggplant. Green beans. Chard. Kale. Spinach. Onions. Leeks. Peas. Squash. Tomatoes. Peppers. Radishes. ? Fruits: Apples. Apricots. Avocado. Berries. Bananas. Cherries. Dates. Figs. Grapes. Lemons. Melon. Oranges. Peaches. Plums. Pomegranate. ? Meats and other protein foods: Beans. Almonds. Sunflower seeds. Pine nuts. Peanuts. [place]. Salmon. Scallops. Shrimp. [place]. Tilapia. Clams. Oysters. Eggs. ? Dairy: Low-fat milk. Cheese. Greek yogurt. ? Beverages: Water. Red wine. Herbal tea. ? Fats and oils: Extra virgin olive oil. Avocado oil. Grape seed oil. ? Sweets and desserts: [place] yogurt with  honey. Baked apples. Poached pears. Trail mix. ? Seasoning and other foods: Basil. Cilantro. Coriander. Cumin. Mint. Parsley. Sage. Rosemary. Tarragon. Garlic. Oregano. Thyme. Pepper. Balsalmic vinegar. Tahini. Hummus. Tomato sauce. Olives. Mushrooms. ? Limit these ? Grains: Prepackaged pasta or rice dishes. Prepackaged cereal with added sugar. ? Vegetables: Deep fried potatoes (french fries). ? Fruits: Fruit canned in syrup. ? Meats and other protein foods: Beef. Pork. Lamb. Poultry with skin. Hot dogs. [place] Salines. ? Dairy: Ice cream. Sour cream. Whole milk. ? Beverages: Juice. Sugar-sweetened soft drinks. Beer. Liquor and spirits. ? Fats and oils: Butter. Canola oil. Vegetable oil. Beef fat (tallow). Lard. ? Sweets and desserts: Cookies. Cakes. Pies. Candy. ? Seasoning and other foods: Mayonnaise. Premade sauces and marinades. ? The items listed may not be a complete list. Talk with your dietitian about what dietary choices are right for you. Summary  The Mediterranean diet includes both food and lifestyle choices.  Eat a variety of fresh fruits and vegetables, beans, nuts, seeds, and whole grains.  Limit the amount of red meat and sweets that you eat.  Talk with your health care provider about whether it is safe for you to drink red wine in moderation. This means 1 glass a day for nonpregnant women and 2 glasses a day for men. A glass of wine equals 5 oz (150 mL). This information is not intended to replace advice given to you by your health care provider. Make sure you discuss any questions you have with your health care provider. Document Released: 03/13/2016 Document Revised: 04/15/2016 Document Reviewed: 03/13/2016 Elsevier Interactive Patient Education  2018 [place] Maintenance for Postmenopausal Women Menopause is a normal process in which your reproductive ability comes to an end. This process happens gradually over a span of months to years, usually between the ages of  55 and 65. Menopause is complete when you have missed 12 consecutive menstrual periods. It is important to talk with your health care provider about some of the most common conditions that affect postmenopausal women, such as heart disease, cancer, and bone loss (osteoporosis). Adopting a healthy lifestyle and getting preventive care can help to promote your health and wellness. Those actions can also lower your chances of developing some of these common conditions. What should I know about menopause? During menopause, you may experience a number of symptoms, such as:  Moderate-to-severe hot flashes.  Night sweats.  Decrease in sex drive.  Mood swings.  Headaches.  Tiredness.  Irritability.  Memory problems.  Insomnia.  Choosing to treat or not to treat menopausal changes is an individual decision that you make with your health care provider. What should I know about hormone replacement therapy and supplements? Hormone therapy products are effective for treating symptoms that are associated with menopause, such as hot flashes and night sweats. Hormone replacement carries certain risks, especially as you become older. If you  are thinking about using estrogen or estrogen with progestin treatments, discuss the benefits and risks with your health care provider. What should I know about heart disease and stroke? Heart disease, heart attack, and stroke become more likely as you age. This may be due, in part, to the hormonal changes that your body experiences during menopause. These can affect how your body processes dietary fats, triglycerides, and cholesterol. Heart attack and stroke are both medical emergencies. There are many things that you can do to help prevent heart disease and stroke:  Have your blood pressure checked at least every 1-2 years. High blood pressure causes heart disease and increases the risk of stroke.  If you are 73-46 years old, ask your health care provider if you  should take aspirin to prevent a heart attack or a stroke.  Do not use any tobacco products, including cigarettes, chewing tobacco, or electronic cigarettes. If you need help quitting, ask your health care provider.  It is important to eat a healthy diet and maintain a healthy weight. ? Be sure to include plenty of vegetables, fruits, low-fat dairy products, and lean protein. ? Avoid eating foods that are high in solid fats, added sugars, or salt (sodium).  Get regular exercise. This is one of the most important things that you can do for your health. ? Try to exercise for at least 150 minutes each week. The type of exercise that you do should increase your heart rate and make you sweat. This is known as moderate-intensity exercise. ? Try to do strengthening exercises at least twice each week. Do these in addition to the moderate-intensity exercise.  Know your numbers.Ask your health care provider to check your cholesterol and your blood glucose. Continue to have your blood tested as directed by your health care provider.  What should I know about cancer screening? There are several types of cancer. Take the following steps to reduce your risk and to catch any cancer development as early as possible. Breast Cancer  Practice breast self-awareness. ? This means understanding how your breasts normally appear and feel. ? It also means doing regular breast self-exams. Let your health care provider know about any changes, no matter how small.  If you are 62 or older, have a clinician do a breast exam (clinical breast exam or CBE) every year. Depending on your age, family history, and medical history, it may be recommended that you also have a yearly breast X-ray (mammogram).  If you have a family history of breast cancer, talk with your health care provider about genetic screening.  If you are at high risk for breast cancer, talk with your health care provider about having an MRI and a mammogram  every year.  Breast cancer (BRCA) gene test is recommended for women who have family members with BRCA-related cancers. Results of the assessment will determine the need for genetic counseling and BRCA1 and for BRCA2 testing. BRCA-related cancers include these types: ? Breast. This occurs in males or females. ? Ovarian. ? Tubal. This may also be called fallopian tube cancer. ? Cancer of the abdominal or pelvic lining (peritoneal cancer). ? Prostate. ? Pancreatic.  Cervical, Uterine, and Ovarian Cancer Your health care provider may recommend that you be screened regularly for cancer of the pelvic organs. These include your ovaries, uterus, and vagina. This screening involves a pelvic exam, which includes checking for microscopic changes to the surface of your cervix (Pap test).  For women ages 21-65, health care providers may recommend  a pelvic exam and a Pap test every three years. For women ages 52-65, they may recommend the Pap test and pelvic exam, combined with testing for human papilloma virus (HPV), every five years. Some types of HPV increase your risk of cervical cancer. Testing for HPV may also be done on women of any age who have unclear Pap test results.  Other health care providers may not recommend any screening for nonpregnant women who are considered low risk for pelvic cancer and have no symptoms. Ask your health care provider if a screening pelvic exam is right for you.  If you have had past treatment for cervical cancer or a condition that could lead to cancer, you need Pap tests and screening for cancer for at least 20 years after your treatment. If Pap tests have been discontinued for you, your risk factors (such as having a new sexual partner) need to be reassessed to determine if you should start having screenings again. Some women have medical problems that increase the chance of getting cervical cancer. In these cases, your health care provider may recommend that you have  screening and Pap tests more often.  If you have a family history of uterine cancer or ovarian cancer, talk with your health care provider about genetic screening.  If you have vaginal bleeding after reaching menopause, tell your health care provider.  There are currently no reliable tests available to screen for ovarian cancer.  Lung Cancer Lung cancer screening is recommended for adults 42-37 years old who are at high risk for lung cancer because of a history of smoking. A yearly low-dose CT scan of the lungs is recommended if you:  Currently smoke.  Have a history of at least 30 pack-years of smoking and you currently smoke or have quit within the past 15 years. A pack-year is smoking an average of one pack of cigarettes per day for one year.  Yearly screening should:  Continue until it has been 15 years since you quit.  Stop if you develop a health problem that would prevent you from having lung cancer treatment.  Colorectal Cancer  This type of cancer can be detected and can often be prevented.  Routine colorectal cancer screening usually begins at age 55 and continues through age 82.  If you have risk factors for colon cancer, your health care provider may recommend that you be screened at an earlier age.  If you have a family history of colorectal cancer, talk with your health care provider about genetic screening.  Your health care provider may also recommend using home test kits to check for hidden blood in your stool.  A small camera at the end of a tube can be used to examine your colon directly (sigmoidoscopy or colonoscopy). This is done to check for the earliest forms of colorectal cancer.  Direct examination of the colon should be repeated every 5-10 years until age 21. However, if early forms of precancerous polyps or small growths are found or if you have a family history or genetic risk for colorectal cancer, you may need to be screened more often.  Skin  Cancer  Check your skin from head to toe regularly.  Monitor any moles. Be sure to tell your health care provider: ? About any new moles or changes in moles, especially if there is a change in a mole's shape or color. ? If you have a mole that is larger than the size of a pencil eraser.  If any of  your family members has a history of skin cancer, especially at a young age, talk with your health care provider about genetic screening.  Always use sunscreen. Apply sunscreen liberally and repeatedly throughout the day.  Whenever you are outside, protect yourself by wearing long sleeves, pants, a wide-brimmed hat, and sunglasses.  What should I know about osteoporosis? Osteoporosis is a condition in which bone destruction happens more quickly than new bone creation. After menopause, you may be at an increased risk for osteoporosis. To help prevent osteoporosis or the bone fractures that can happen because of osteoporosis, the following is recommended:  If you are 29-53 years old, get at least 1,000 mg of calcium and at least 600 mg of vitamin D per day.  If you are older than age 59 but younger than age 3, get at least 1,200 mg of calcium and at least 600 mg of vitamin D per day.  If you are older than age 80, get at least 1,200 mg of calcium and at least 800 mg of vitamin D per day.  Smoking and excessive alcohol intake increase the risk of osteoporosis. Eat foods that are rich in calcium and vitamin D, and do weight-bearing exercises several times each week as directed by your health care provider. What should I know about how menopause affects my mental health? Depression may occur at any age, but it is more common as you become older. Common symptoms of depression include:  Low or sad mood.  Changes in sleep patterns.  Changes in appetite or eating patterns.  Feeling an overall lack of motivation or enjoyment of activities that you previously enjoyed.  Frequent crying  spells.  Talk with your health care provider if you think that you are experiencing depression. What should I know about immunizations? It is important that you get and maintain your immunizations. These include:  Tetanus, diphtheria, and pertussis (Tdap) booster vaccine.  Influenza every year before the flu season begins.  Pneumonia vaccine.  Shingles vaccine.  Your health care provider may also recommend other immunizations. This information is not intended to replace advice given to you by your health care provider. Make sure you discuss any questions you have with your health care provider. Document Released: 09/12/2005 Document Revised: 02/08/2016 Document Reviewed: 04/24/2015 Elsevier Interactive Patient Education  2018 [employer].

## 2017-08-26 LAB — HEPATITIS C ANTIBODY
Hepatitis C Ab: NONREACTIVE
SIGNAL TO CUT-OFF: 0.01 (ref <1.00)

## 2017-08-29 ENCOUNTER — Ambulatory Visit: Payer: BLUE CROSS/BLUE SHIELD | Admitting: Adult Health

## 2017-08-29 ENCOUNTER — Encounter: Payer: Self-pay | Admitting: Adult Health

## 2017-08-29 VITALS — BP 118/72 | HR 83 | Temp 98.7°F | Resp 16 | Wt 180.0 lb

## 2017-08-29 DIAGNOSIS — J40 Bronchitis, not specified as acute or chronic: Secondary | ICD-10-CM | POA: Diagnosis not present

## 2017-08-29 MED ORDER — HYDROCODONE-HOMATROPINE 5-1.5 MG/5ML PO SYRP: 5.0000 mL | ORAL_SOLUTION | Freq: Three times a day (TID) | ORAL | 0 refills | Status: DC | PRN

## 2017-08-29 MED ORDER — PREDNISONE 10 MG PO TABS: ORAL_TABLET | ORAL | 0 refills | Status: DC

## 2017-08-29 NOTE — Progress Notes (Signed)
Subjective:    Patient ID: Kathy Herman, female    DOB: 06-Dec-1945, 72 y.o.   MRN: [id number]  URI   This is a new problem. The current episode started in the past 7 days. There has been no fever. Associated symptoms include congestion, coughing (semi productive ), headaches, a sore throat and wheezing. Pertinent negatives include no rhinorrhea or sinus pain. She has tried decongestant for the symptoms. The treatment provided mild relief.    Review of Systems  Constitutional: Positive for activity change and fatigue. Negative for chills, diaphoresis and fever.  HENT: Positive for congestion and sore throat. Negative for postnasal drip, rhinorrhea, sinus pressure and sinus pain.   Eyes: Negative.   Respiratory: Positive for cough (semi productive ) and wheezing. Negative for chest tightness and shortness of breath.   Cardiovascular: Negative.   Gastrointestinal: Negative.   Neurological: Positive for headaches.   Past Medical History:  Diagnosis Date  . Allergic rhinitis, cause unspecified 05/31/2007  . CERVICAL RADICULOPATHY, RIGHT 01/07/2010  . GERD 07/13/2007    Social History   Socioeconomic History  . Marital status: Married    Spouse name: Not on file  . Number of children: Not on file  . Years of education: Not on file  . Highest education level: Not on file  Social Needs  . Financial resource strain: Not on file  . Food insecurity - worry: Not on file  . Food insecurity - inability: Not on file  . Transportation needs - medical: Not on file  . Transportation needs - non-medical: Not on file  Occupational History  . Not on file  Tobacco Use  . Smoking status: Never Smoker  . Smokeless tobacco: Never Used  Substance and Sexual Activity  . Alcohol use: Not on file  . Drug use: Not on file  . Sexual activity: Not on file  Other Topics Concern  . Not on file  Social History Narrative  . Not on file    Past Surgical History:  Procedure Laterality Date  .  TUBAL LIGATION      No family history on file.  Allergies  Allergen Reactions  . Erythromycin     REACTION: nausea    Current Outpatient Medications on File Prior to Visit  Medication Sig Dispense Refill  . esomeprazole (NEXIUM) 40 MG capsule Take 40 mg by mouth 2 (two) times daily.      Marland Kitchen FINASTERIDE PO Take by mouth.    . Multiple Vitamin (MULTIVITAMIN WITH MINERALS) TABS Take 1 tablet by mouth daily.    . valACYclovir (VALTREX) 1000 MG tablet   0  . Vitamin D, Cholecalciferol, 1000 units TABS Take 1,000 Units by mouth 2 (two) times daily.     No current facility-administered medications on file prior to visit.     BP 118/72   Pulse 83   Temp 98.7 F (37.1 C) (Oral)   Resp 16   Wt 180 lb (81.6 kg)   SpO2 98%   BMI 30.90 kg/m       Objective:   Physical Exam  Constitutional: She is oriented to person, place, and time. She appears well-developed and well-nourished. No distress.  HENT:  Head: Normocephalic and atraumatic.  Right Ear: External ear normal.  Left Ear: External ear normal.  Nose: Nose normal.  Mouth/Throat: Oropharynx is clear and moist. No oropharyngeal exudate.  Eyes: Conjunctivae and EOM are normal. Pupils are equal, round, and reactive to light. Right eye exhibits no discharge. Left eye  exhibits no discharge. No scleral icterus.  Neck: Normal range of motion. Neck supple.  Cardiovascular: Normal rate, regular rhythm, normal heart sounds and intact distal pulses. Exam reveals no gallop and no friction rub.  No murmur heard. Pulmonary/Chest: No respiratory distress. She has wheezes (expiratory throughout ). She has no rales. She exhibits no tenderness.  Lymphadenopathy:    She has no cervical adenopathy.  Neurological: She is alert and oriented to person, place, and time.  Skin: Skin is warm and dry. No rash noted. She is not diaphoretic. No erythema. No pallor.  Psychiatric: She has a normal mood and affect. Her behavior is normal. Judgment and  thought content normal.  Nursing note and vitals reviewed.     Assessment & Plan:  1. Bronchitis - predniSONE (DELTASONE) 10 MG tablet; 40 mg x 3 days, 20 mg x 3 days, 10 mg x 3 days  Dispense: 21 tablet; Refill: 0 - HYDROcodone-homatropine (HYCODAN) 5-1.5 MG/5ML syrup; Take 5 mLs by mouth every 8 (eight) hours as needed for cough.  Dispense: 120 mL; Refill: 0 - Follow up with PCP if no improvement or if fever develops   Shirline Freesory Eran Windish, NP

## 2017-09-02 ENCOUNTER — Encounter: Payer: Self-pay | Admitting: Internal Medicine

## 2017-09-29 ENCOUNTER — Ambulatory Visit: Payer: BLUE CROSS/BLUE SHIELD | Admitting: Family Medicine

## 2017-09-29 ENCOUNTER — Ambulatory Visit: Payer: Self-pay | Admitting: *Deleted

## 2017-09-29 DIAGNOSIS — Z0289 Encounter for other administrative examinations: Secondary | ICD-10-CM

## 2017-09-29 NOTE — Telephone Encounter (Signed)
Pt  Reports  She  Became  Ill   approx  4 hours after   Eating  At  A  Restaurant   She  Had  Vomiting  And  abd  Discomfort  Initially  -  She  Reports  Diarrhea  Has  Persisted  Since.  She  Reports  A decrease  Urinary  Out  Put but is  Difficult   To    Gauge  As she has  The  Diarrhea.  Attempted  To make  appt  With  Dr Fonnie Mu    But no  Availability  For the  Next several  Days Appt made today  With Dr [place]     Reason for Disposition . [1] SEVERE diarrhea (e.g., 7 or more times / day more than normal) AND [2] present > 24 hours (1 day)  Answer Assessment - Initial Assessment Questions 1. DIARRHEA SEVERITY: "How bad is the diarrhea?" "How many extra stools have you had in the past 24 hours than normal?"    - MILD: Few loose or mushy BMs; increase of 1-3 stools over normal daily number of stools; mild increase in ostomy output.   - MODERATE: Increase of 4-6 stools daily over normal; moderate increase in ostomy output.   - SEVERE (or Worst Possible): Increase of 7 or more stools daily over normal; moderate increase in ostomy output; incontinence.     Severe    Liquid     2. ONSET: "When did the diarrhea begin?"        3/1/2  Days   3. BM CONSISTENCY: "How loose or watery is the diarrhea?"        Watery   4. VOMITING: "Are you also vomiting?" If so, ask: "How many times in the past 24 hours?"         Vomited  Initially    After 4  Hours  After  Meal     Stopped   After 4  Hours   5. ABDOMINAL PAIN: "Are you having any abdominal pain?" If yes: "What does it feel like?" (e.g., crampy, dull, intermittent, constant)      None  Now  Other Slight  crampy  Sensation  Low   Associated  With  Diarrhea     6. ABDOMINAL PAIN SEVERITY: If present, ask: "How bad is the pain?"  (e.g., Scale 1-10; mild, moderate, or severe)    - MILD (1-3): doesn't interfere with normal activities, abdomen soft and not tender to touch     - MODERATE (4-7): interferes with normal activities or awakens from sleep,  tender to touch     - SEVERE (8-10): excruciating pain, doubled over, unable to do any normal activities       Mild   7. ORAL INTAKE: If vomiting, "Have you been able to drink liquids?" "How much fluids have you had in the past 24 hours?"      No  Vomiting   Drinking  Water  And  gatorade  tolerating  Well  8. HYDRATION: "Any signs of dehydration?" (e.g., dry mouth [not just dry lips], too weak to stand, dizziness, new weight loss) "When did you last urinate?"    Decreased   Urine  Output   Last  Urination    5  Hours  Ago   9. EXPOSURE: "Have you traveled to a foreign country recently?" "Have you been exposed to anyone with diarrhea?" "Could you have eaten any food that was spoiled?"  Possiblly  Something  She  Ate  Friday   10. OTHER SYMPTOMS: "Do you have any other symptoms?" (e.g., fever, blood in stool)       No 11. PREGNANCY: "Is there any chance you are pregnant?" "When was your last menstrual period?"       N/a  Protocols used: DIARRHEA-A-AH

## 2017-11-06 ENCOUNTER — Ambulatory Visit: Payer: Self-pay | Admitting: *Deleted

## 2017-11-06 NOTE — Telephone Encounter (Signed)
Patient on schedule to see Dr. Kirtland BouchardK on Tuesday 11/10/2017.

## 2017-11-06 NOTE — Telephone Encounter (Signed)
Patient states she saw her ortho today and he would like the edema that she is experiencing evaluated by her PCP because it tends to be a bilateral problem- although the surgical leg is worse. Patient states she has noticed the swelling for about 4 months now. Offered appointment in office- but patient is unable to come- she request a specific day and time- call to office and office gave permission to schedule.  Reason for Disposition . [1] MODERATE leg swelling (e.g., swelling extends up to knees) AND [2] new onset or worsening  Answer Assessment - Initial Assessment Questions 1. ONSET: "When did the swelling start?" (e.g., minutes, hours, days)     Patient was seen 4 weeks ago- patient has had swelling for a while- she thought it was related to the surgery- worse in R leg 2. LOCATION: "What part of the leg is swollen?"  "Are both legs swollen or just one leg?"     Bilateral- worse in R leg. Knee down- bilarteral 3. SEVERITY: "How bad is the swelling?" (e.g., localized; mild, moderate, severe)  - Localized - small area of swelling localized to one leg  - MILD pedal edema - swelling limited to foot and ankle, pitting edema < 1/4 inch (6 mm) deep, rest and elevation eliminate most or all swelling  - MODERATE edema - swelling of lower leg to knee, pitting edema > 1/4 inch (6 mm) deep, rest and elevation only partially reduce swelling  - SEVERE edema - swelling extends above knee, facial or hand swelling present      moderate 4. REDNESS: "Does the swelling look red or infected?"     No redness 5. PAIN: "Is the swelling painful to touch?" If so, ask: "How painful is it?"   (Scale 1-10; mild, moderate or severe)     Patient has pain in R leg- she does have tightness in the L leg 6. FEVER: "Do you have a fever?" If so, ask: "What is it, how was it measured, and when did it start?"      no 7. CAUSE: "What do you think is causing the leg swelling?"     Patient thought it was related to the surgery-  but her surgeon wants her evaluated  8. MEDICAL HISTORY: "Do you have a history of heart failure, kidney disease, liver failure, or cancer?"     no 9. RECURRENT SYMPTOM: "Have you had leg swelling before?" If so, ask: "When was the last time?" "What happened that time?"     It has been present for a while- 4 months 10. OTHER SYMPTOMS: "Do you have any other symptoms?" (e.g., chest pain, difficulty breathing)       no 11. PREGNANCY: "Is there any chance you are pregnant?" "When was your last menstrual period?"       n/a  Protocols used: LEG SWELLING AND EDEMA-A-AH

## 2017-11-10 ENCOUNTER — Encounter: Payer: Self-pay | Admitting: Internal Medicine

## 2017-11-10 ENCOUNTER — Ambulatory Visit: Payer: BLUE CROSS/BLUE SHIELD | Admitting: Internal Medicine

## 2017-11-10 VITALS — BP 112/60 | HR 85 | Temp 97.9°F | Wt 180.0 lb

## 2017-11-10 DIAGNOSIS — E119 Type 2 diabetes mellitus without complications: Secondary | ICD-10-CM

## 2017-11-10 DIAGNOSIS — R600 Localized edema: Secondary | ICD-10-CM

## 2017-11-10 DIAGNOSIS — M23306 Other meniscus derangements, unspecified meniscus, right knee: Secondary | ICD-10-CM

## 2017-11-10 MED ORDER — METFORMIN HCL ER 500 MG PO TB24: 1000.0000 mg | ORAL_TABLET | Freq: Every day | ORAL | 3 refills | Status: DC

## 2017-11-10 NOTE — Progress Notes (Signed)
Subjective:    Patient ID: Kathy Herman, female    DOB: 1946/01/10, 72 y.o.   MRN: [id number]  HPI  Wt Readings from Last 3 Encounters:  11/10/17 180 lb (81.6 kg)  08/29/17 180 lb (81.6 kg)  08/25/17 179 lb (64.81 kg)   72 year old patient who is status post right total knee replacement surgery in July of last year.  She has had some mild bilateral lower extremity edema since just prior to the Christmas holidays.  She was treated for acute bronchitis with prednisone in January the lower extremity edema worsened slightly towards the end of the day.  She was seen by orthopedics earlier who suggested medical follow-up due to the peripheral edema She was also seen in January for her annual exam.  Fasting blood sugar was slightly elevated. Hemoglobin A1c today 7.1   Past Medical History:  Diagnosis Date  . Allergic rhinitis, cause unspecified 05/31/2007  . CERVICAL RADICULOPATHY, RIGHT 01/07/2010  . GERD 07/13/2007     Social History   Socioeconomic History  . Marital status: Married    Spouse name: Not on file  . Number of children: Not on file  . Years of education: Not on file  . Highest education level: Not on file  Occupational History  . Not on file  Social Needs  . Financial resource strain: Not on file  . Food insecurity:    Worry: Not on file    Inability: Not on file  . Transportation needs:    Medical: Not on file    Non-medical: Not on file  Tobacco Use  . Smoking status: Never Smoker  . Smokeless tobacco: Never Used  Substance and Sexual Activity  . Alcohol use: Not on file  . Drug use: Not on file  . Sexual activity: Not on file  Lifestyle  . Physical activity:    Days per week: Not on file    Minutes per session: Not on file  . Stress: Not on file  Relationships  . Social connections:    Talks on phone: Not on file    Gets together: Not on file    Attends religious service: Not on file    Active member of club or organization: Not on file   Attends meetings of clubs or organizations: Not on file    Relationship status: Not on file  . Intimate partner violence:    Fear of current or ex partner: Not on file    Emotionally abused: Not on file    Physically abused: Not on file    Forced sexual activity: Not on file  Other Topics Concern  . Not on file  Social History Narrative  . Not on file    Past Surgical History:  Procedure Laterality Date  . TUBAL LIGATION      History reviewed. No pertinent family history.  Allergies  Allergen Reactions  . Erythromycin     REACTION: nausea    Current Outpatient Medications on File Prior to Visit  Medication Sig Dispense Refill  . esomeprazole (NEXIUM) 40 MG capsule Take 40 mg by mouth 2 (two) times daily.      Marland Kitchen FINASTERIDE PO Take by mouth.    . gabapentin (NEURONTIN) 300 MG capsule   2  . Multiple Vitamin (MULTIVITAMIN WITH MINERALS) TABS Take 1 tablet by mouth daily.    . predniSONE (DELTASONE) 10 MG tablet 40 mg x 3 days, 20 mg x 3 days, 10 mg x 3 days 21 tablet 0  .  valACYclovir (VALTREX) 1000 MG tablet   0  . Vitamin D, Cholecalciferol, 1000 units TABS Take 1,000 Units by mouth 2 (two) times daily.     No current facility-administered medications on file prior to visit.     BP 112/60 (BP Location: Right Arm, Patient Position: Sitting, Cuff Size: Large)   Pulse 85   Temp 97.9 F (36.6 C) (Oral)   Wt 180 lb (81.6 kg)   SpO2 95%   BMI 30.90 kg/m     Review of Systems  Constitutional: Negative.   HENT: Negative for congestion, dental problem, hearing loss, rhinorrhea, sinus pressure, sore throat and tinnitus.   Eyes: Negative for pain, discharge and visual disturbance.  Respiratory: Negative for cough and shortness of breath.   Cardiovascular: Positive for leg swelling. Negative for chest pain and palpitations.  Gastrointestinal: Negative for abdominal distention, abdominal pain, blood in stool, constipation, diarrhea, nausea and vomiting.  Genitourinary:  Negative for difficulty urinating, dysuria, flank pain, frequency, hematuria, pelvic pain, urgency, vaginal bleeding, vaginal discharge and vaginal pain.  Musculoskeletal: Negative for arthralgias, gait problem and joint swelling.  Skin: Negative for rash.  Neurological: Negative for dizziness, syncope, speech difficulty, weakness, numbness and headaches.  Hematological: Negative for adenopathy.  Psychiatric/Behavioral: Negative for agitation, behavioral problems and dysphoric mood. The patient is not nervous/anxious.        Objective:   Physical Exam  Constitutional: She is oriented to person, place, and time. She appears well-developed and well-nourished.    Blood pressure 130/80 weight 180  HENT:  Head: Normocephalic.  Right Ear: External ear normal.  Left Ear: External ear normal.  Mouth/Throat: Oropharynx is clear and moist.  Eyes: Pupils are equal, round, and reactive to light. Conjunctivae and EOM are normal.  Neck: Normal range of motion. Neck supple. No thyromegaly present.  Cardiovascular: Normal rate, regular rhythm, normal heart sounds and intact distal pulses.  Pulmonary/Chest: Effort normal and breath sounds normal.  Abdominal: Soft. Bowel sounds are normal. She exhibits no mass. There is no tenderness.  Musculoskeletal: Normal range of motion. She exhibits edema.  +1 bilateral leg edema  Well-healed surgical scar right knee  Lymphadenopathy:    She has no cervical adenopathy.  Neurological: She is alert and oriented to person, place, and time.  Skin: Skin is warm and dry. No rash noted.  Psychiatric: She has a normal mood and affect. Her behavior is normal.          Assessment & Plan:   Mild bilateral lower extremity edema.  Will place on low-salt diet and observe Diabetes mellitus type 2.  Hemoglobin A1c today 7.1 we will start better diet and improve lifestyle issues.  Will attempt to lose weight and exercise more.  Will place on metformin therapy and  follow-up in 3 months  Dorr Perrot Homero FellersFRANK

## 2017-11-10 NOTE — Patient Instructions (Addendum)
Limit your sodium (Salt) intake   Please check your hemoglobin A1c every 3 months  Return in 3 months for follow-up  Low-Sodium Eating Plan Sodium, which is an element that makes up salt, helps you maintain a healthy balance of fluids in your body. Too much sodium can increase your blood pressure and cause fluid and waste to be held in your body. Your health care provider or dietitian may recommend following this plan if you have high blood pressure (hypertension), kidney disease, liver disease, or heart failure. Eating less sodium can help lower your blood pressure, reduce swelling, and protect your heart, liver, and kidneys. What are tips for following this plan? General guidelines  Most people on this plan should limit their sodium intake to 1,500-2,000 mg (milligrams) of sodium each day. Reading food labels  The Nutrition Facts label lists the amount of sodium in one serving of the food. If you eat more than one serving, you must multiply the listed amount of sodium by the number of servings.  Choose foods with less than 140 mg of sodium per serving.  Avoid foods with 300 mg of sodium or more per serving. Shopping  Look for lower-sodium products, often labeled as "low-sodium" or "no salt added."  Always check the sodium content even if foods are labeled as "unsalted" or "no salt added".  Buy fresh foods. ? Avoid canned foods and premade or frozen meals. ? Avoid canned, cured, or processed meats  Buy breads that have less than 80 mg of sodium per slice. Cooking  Eat more home-cooked food and less restaurant, buffet, and fast food.  Avoid adding salt when cooking. Use salt-free seasonings or herbs instead of table salt or sea salt. Check with your health care provider or pharmacist before using salt substitutes.  Cook with plant-based oils, such as canola, sunflower, or olive oil. Meal planning  When eating at a restaurant, ask that your food be prepared with less salt or no  salt, if possible.  Avoid foods that contain MSG (monosodium glutamate). MSG is sometimes added to [place] food, bouillon, and some canned foods. What foods are recommended? The items listed may not be a complete list. Talk with your dietitian about what dietary choices are best for you. Grains Low-sodium cereals, including oats, puffed wheat and rice, and shredded wheat. Low-sodium crackers. Unsalted rice. Unsalted pasta. Low-sodium bread. Whole-grain breads and whole-grain pasta. Vegetables Fresh or frozen vegetables. "No salt added" canned vegetables. "No salt added" tomato sauce and paste. Low-sodium or reduced-sodium tomato and vegetable juice. Fruits Fresh, frozen, or canned fruit. Fruit juice. Meats and other protein foods Fresh or frozen (no salt added) meat, poultry, seafood, and fish. Low-sodium canned tuna and salmon. Unsalted nuts. Dried peas, beans, and lentils without added salt. Unsalted canned beans. Eggs. Unsalted nut butters. Dairy Milk. Soy milk. Cheese that is naturally low in sodium, such as ricotta cheese, fresh mozzarella, or Swiss cheese Low-sodium or reduced-sodium cheese. Cream cheese. Yogurt. Fats and oils Unsalted butter. Unsalted margarine with no trans fat. Vegetable oils such as canola or olive oils. Seasonings and other foods Fresh and dried herbs and spices. Salt-free seasonings. Low-sodium mustard and ketchup. Sodium-free salad dressing. Sodium-free light mayonnaise. Fresh or refrigerated horseradish. Lemon juice. Vinegar. Homemade, reduced-sodium, or low-sodium soups. Unsalted popcorn and pretzels. Low-salt or salt-free chips. What foods are not recommended? The items listed may not be a complete list. Talk with your dietitian about what dietary choices are best for you. Grains Instant hot cereals. Bread  stuffing, pancake, and biscuit mixes. Croutons. Seasoned rice or pasta mixes. Noodle soup cups. Boxed or frozen macaroni and cheese. Regular salted crackers.  Self-rising flour. Vegetables Sauerkraut, pickled vegetables, and relishes. Olives. [place] fries. Onion rings. Regular canned vegetables (not low-sodium or reduced-sodium). Regular canned tomato sauce and paste (not low-sodium or reduced-sodium). Regular tomato and vegetable juice (not low-sodium or reduced-sodium). Frozen vegetables in sauces. Meats and other protein foods Meat or fish that is salted, canned, smoked, spiced, or pickled. Bacon, ham, sausage, hotdogs, corned beef, chipped beef, packaged lunch meats, salt pork, jerky, pickled herring, anchovies, regular canned tuna, sardines, salted nuts. Dairy Processed cheese and cheese spreads. Cheese curds. Blue cheese. Feta cheese. String cheese. Regular cottage cheese. Buttermilk. Canned milk. Fats and oils Salted butter. Regular margarine. Ghee. Bacon fat. Seasonings and other foods Onion salt, garlic salt, seasoned salt, table salt, and sea salt. Canned and packaged gravies. Worcestershire sauce. Tartar sauce. Barbecue sauce. Teriyaki sauce. Soy sauce, including reduced-sodium. Steak sauce. Fish sauce. Oyster sauce. Cocktail sauce. Horseradish that you find on the shelf. Regular ketchup and mustard. Meat flavorings and tenderizers. Bouillon cubes. Hot sauce and Tabasco sauce. Premade or packaged marinades. Premade or packaged taco seasonings. Relishes. Regular salad dressings. Salsa. Potato and tortilla chips. Corn chips and puffs. Salted popcorn and pretzels. Canned or dried soups. Pizza. Frozen entrees and pot pies. Summary  Eating less sodium can help lower your blood pressure, reduce swelling, and protect your heart, liver, and kidneys.  Most people on this plan should limit their sodium intake to 1,500-2,000 mg (milligrams) of sodium each day.  Canned, boxed, and frozen foods are high in sodium. Restaurant foods, fast foods, and pizza are also very high in sodium. You also get sodium by adding salt to food.  Try to cook at home, eat  more fresh fruits and vegetables, and eat less fast food, canned, processed, or prepared foods. This information is not intended to replace advice given to you by your health care provider. Make sure you discuss any questions you have with your health care provider. Document Released: 01/10/2002 Document Revised: 07/14/2016 Document Reviewed: 07/14/2016 Elsevier Interactive Patient Education  2018 [employer]  Diabetes Mellitus and Exercise Exercising regularly is important for your overall health, especially when you have diabetes (diabetes mellitus). Exercising is not only about losing weight. It has many health benefits, such as increasing muscle strength and bone density and reducing body fat and stress. This leads to improved fitness, flexibility, and endurance, all of which result in better overall health. Exercise has additional benefits for people with diabetes, including:  Reducing appetite.  Helping to lower and control blood glucose.  Lowering blood pressure.  Helping to control amounts of fatty substances (lipids) in the blood, such as cholesterol and triglycerides.  Helping the body to respond better to insulin (improving insulin sensitivity).  Reducing how much insulin the body needs.  Decreasing the risk for heart disease by: ? Lowering cholesterol and triglyceride levels. ? Increasing the levels of good cholesterol. ? Lowering blood glucose levels.  What is my activity plan? Your health care provider or certified diabetes educator can help you make a plan for the type and frequency of exercise (activity plan) that works for you. Make sure that you:  Do at least 150 minutes of moderate-intensity or vigorous-intensity exercise each week. This could be brisk walking, biking, or water aerobics. ? Do stretching and strength exercises, such as yoga or weightlifting, at least 2 times a week. ? Spread out  your activity over at least 3 days of the week.  Get some form of  physical activity every day. ? Do not go more than 2 days in a row without some kind of physical activity. ? Avoid being inactive for more than 90 minutes at a time. Take frequent breaks to walk or stretch.  Choose a type of exercise or activity that you enjoy, and set realistic goals.  Start slowly, and gradually increase the intensity of your exercise over time.  What do I need to know about managing my diabetes?  Check your blood glucose before and after exercising. ? If your blood glucose is higher than 240 mg/dL (16.1 mmol/L) before you exercise, check your urine for ketones. If you have ketones in your urine, do not exercise until your blood glucose returns to normal.  Know the symptoms of low blood glucose (hypoglycemia) and how to treat it. Your risk for hypoglycemia increases during and after exercise. Common symptoms of hypoglycemia can include: ? Hunger. ? Anxiety. ? Sweating and feeling clammy. ? Confusion. ? Dizziness or feeling light-headed. ? Increased heart rate or palpitations. ? Blurry vision. ? Tingling or numbness around the mouth, lips, or tongue. ? Tremors or shakes. ? Irritability.  Keep a rapid-acting carbohydrate snack available before, during, and after exercise to help prevent or treat hypoglycemia.  Avoid injecting insulin into areas of the body that are going to be exercised. For example, avoid injecting insulin into: ? The arms, when playing tennis. ? The legs, when jogging.  Keep records of your exercise habits. Doing this can help you and your health care provider adjust your diabetes management plan as needed. Write down: ? Food that you eat before and after you exercise. ? Blood glucose levels before and after you exercise. ? The type and amount of exercise you have done. ? When your insulin is expected to peak, if you use insulin. Avoid exercising at times when your insulin is peaking.  When you start a new exercise or activity, work with your  health care provider to make sure the activity is safe for you, and to adjust your insulin, medicines, or food intake as needed.  Drink plenty of water while you exercise to prevent dehydration or heat stroke. Drink enough fluid to keep your urine clear or pale yellow. This information is not intended to replace advice given to you by your health care provider. Make sure you discuss any questions you have with your health care provider. Document Released: 10/11/2003 Document Revised: 02/08/2016 Document Reviewed: 12/31/2015 Elsevier Interactive Patient Education  2018 [employer].  Diabetes Mellitus and Nutrition When you have diabetes (diabetes mellitus), it is very important to have healthy eating habits because your blood sugar (glucose) levels are greatly affected by what you eat and drink. Eating healthy foods in the appropriate amounts, at about the same times every day, can help you:  Control your blood glucose.  Lower your risk of heart disease.  Improve your blood pressure.  Reach or maintain a healthy weight.  Every person with diabetes is different, and each person has different needs for a meal plan. Your health care provider may recommend that you work with a diet and nutrition specialist (dietitian) to make a meal plan that is best for you. Your meal plan may vary depending on factors such as:  The calories you need.  The medicines you take.  Your weight.  Your blood glucose, blood pressure, and cholesterol levels.  Your activity level.  Other health conditions you have, such as heart or kidney disease.  How do carbohydrates affect me? Carbohydrates affect your blood glucose level more than any other type of food. Eating carbohydrates naturally increases the amount of glucose in your blood. Carbohydrate counting is a method for keeping track of how many carbohydrates you eat. Counting carbohydrates is important to keep your blood glucose at a healthy level, especially  if you use insulin or take certain oral diabetes medicines. It is important to know how many carbohydrates you can safely have in each meal. This is different for every person. Your dietitian can help you calculate how many carbohydrates you should have at each meal and for snack. Foods that contain carbohydrates include:  Bread, cereal, rice, pasta, and crackers.  Potatoes and corn.  Peas, beans, and lentils.  Milk and yogurt.  Fruit and juice.  Desserts, such as cakes, cookies, ice cream, and candy.  How does alcohol affect me? Alcohol can cause a sudden decrease in blood glucose (hypoglycemia), especially if you use insulin or take certain oral diabetes medicines. Hypoglycemia can be a life-threatening condition. Symptoms of hypoglycemia (sleepiness, dizziness, and confusion) are similar to symptoms of having too much alcohol. If your health care provider says that alcohol is safe for you, follow these guidelines:  Limit alcohol intake to no more than 1 drink per day for nonpregnant women and 2 drinks per day for men. One drink equals 12 oz of beer, 5 oz of wine, or 1 oz of hard liquor.  Do not drink on an empty stomach.  Keep yourself hydrated with water, diet soda, or unsweetened iced tea.  Keep in mind that regular soda, juice, and other mixers may contain a lot of sugar and must be counted as carbohydrates.  What are tips for following this plan? Reading food labels  Start by checking the serving size on the label. The amount of calories, carbohydrates, fats, and other nutrients listed on the label are based on one serving of the food. Many foods contain more than one serving per package.  Check the total grams (g) of carbohydrates in one serving. You can calculate the number of servings of carbohydrates in one serving by dividing the total carbohydrates by 15. For example, if a food has 30 g of total carbohydrates, it would be equal to 2 servings of carbohydrates.  Check  the number of grams (g) of saturated and trans fats in one serving. Choose foods that have low or no amount of these fats.  Check the number of milligrams (mg) of sodium in one serving. Most people should limit total sodium intake to less than 2,300 mg per day.  Always check the nutrition information of foods labeled as "low-fat" or "nonfat". These foods may be higher in added sugar or refined carbohydrates and should be avoided.  Talk to your dietitian to identify your daily goals for nutrients listed on the label. Shopping  Avoid buying canned, premade, or processed foods. These foods tend to be high in fat, sodium, and added sugar.  Shop around the outside edge of the grocery store. This includes fresh fruits and vegetables, bulk grains, fresh meats, and fresh dairy. Cooking  Use low-heat cooking methods, such as baking, instead of high-heat cooking methods like deep frying.  Cook using healthy oils, such as olive, canola, or sunflower oil.  Avoid cooking with butter, cream, or high-fat meats. Meal planning  Eat meals and snacks regularly, preferably at the same times every day. Avoid  going long periods of time without eating.  Eat foods high in fiber, such as fresh fruits, vegetables, beans, and whole grains. Talk to your dietitian about how many servings of carbohydrates you can eat at each meal.  Eat 4-6 ounces of lean protein each day, such as lean meat, chicken, fish, eggs, or tofu. 1 ounce is equal to 1 ounce of meat, chicken, or fish, 1 egg, or 1/4 cup of tofu.  Eat some foods each day that contain healthy fats, such as avocado, nuts, seeds, and fish. Lifestyle   Check your blood glucose regularly.  Exercise at least 30 minutes 5 or more days each week, or as told by your health care provider.  Take medicines as told by your health care provider.  Do not use any products that contain nicotine or tobacco, such as cigarettes and e-cigarettes. If you need help quitting,  ask your health care provider.  Work with a Veterinary surgeon or diabetes educator to identify strategies to manage stress and any emotional and social challenges. What are some questions to ask my health care provider?  Do I need to meet with a diabetes educator?  Do I need to meet with a dietitian?  What number can I call if I have questions?  When are the best times to check my blood glucose? Where to find more information:  American Diabetes Association: diabetes.org/food-and-fitness/food  Academy of Nutrition and Dietetics: https://www.vargas.com/  [employer] of Diabetes and Digestive and Kidney Diseases (NIH): FindJewelers.cz Summary  A healthy meal plan will help you control your blood glucose and maintain a healthy lifestyle.  Working with a diet and nutrition specialist (dietitian) can help you make a meal plan that is best for you.  Keep in mind that carbohydrates and alcohol have immediate effects on your blood glucose levels. It is important to count carbohydrates and to use alcohol carefully. This information is not intended to replace advice given to you by your health care provider. Make sure you discuss any questions you have with your health care provider. Document Released: 04/17/2005 Document Revised: 08/25/2016 Document Reviewed: 08/25/2016 Elsevier Interactive Patient Education  2018 [employer].  Tips for Eating Away From Home If You Have Diabetes Controlling your level of blood glucose, also known as blood sugar, can be challenging. It can be even more difficult when you do not prepare your own meals. The following tips can help you manage your diabetes when you eat away from home. Planning ahead Plan ahead if you know you will be eating away from home:  Ask your health care provider how to time meals and medicine if you are taking  insulin.  Make a list of restaurants near you that offer healthy choices. If they have a carry-out menu, take it home and plan what you will order ahead of time.  Look up the restaurant you want to eat at online. Many chain and fast-food restaurants list nutritional information online. Use this information to choose the healthiest options and to calculate how many carbohydrates will be in your meal.  Use a carbohydrate-counting book or mobile app to look up the carbohydrate content and serving size of the foods you want to eat.  Become familiar with serving sizes and learn to recognize how many servings are in a portion. This will allow you to estimate how many carbohydrates you can eat.  Free foods A "free food" is any food or drink that has less than 5 g of carbohydrates per serving. Free foods include:  Many vegetables.  Hard boiled eggs.  Nuts or seeds.  Olives.  Cheeses.  Meats.  These types of foods make good appetizer choices and are often available at salad bars. Lemon juice, vinegar, or a low-calorie salad dressing of fewer than 20 calories per serving can be used as a "free" salad dressing. Choices to reduce carbohydrates  Substitute nonfat sweetened yogurt with a sugar-free yogurt. Yogurt made from soy milk may also be used, but you will still want a sugar-free or plain option to choose a lower carbohydrate amount.  Ask your server to take away the bread basket or chips from your table.  Order fresh fruit. A salad bar often offers fresh fruit choices. Avoid canned fruit because it is usually packed in sugar or syrup.  Order a salad, and eat it without dressing. Or, create a "free" salad dressing.  Ask for substitutions. For example, instead of [place] fries, request an order of a vegetable such as salad, green beans, or broccoli. Other tips  If you take insulin, take the insulin once your food arrives to your table. This will ensure your insulin and food are timed  correctly.  Ask your server about the portion size before your order, and ask for a take-out box if the portion has more servings than you should have. When your food comes, leave the amount you should have on the plate, and put the rest in the take-out box.  Consider splitting an entree with someone and ordering a side salad. This information is not intended to replace advice given to you by your health care provider. Make sure you discuss any questions you have with your health care provider. Document Released: 07/21/2005 Document Revised: 12/27/2015 Document Reviewed: 10/18/2013 Elsevier Interactive Patient Education  2018 [employer].  Type 2 Diabetes Mellitus, Diagnosis, Adult Type 2 diabetes (type 2 diabetes mellitus) is a long-term (chronic) disease. In type 2 diabetes, one or both of these problems may be present:  The pancreas does not make enough of a hormone called insulin.  Cells in the body do not respond properly to insulin that the body makes (insulin resistance).  Normally, insulin allows blood sugar (glucose) to enter cells in the body. The cells use glucose for energy. Insulin resistance or lack of insulin causes excess glucose to build up in the blood instead of going into cells. As a result, high blood glucose (hyperglycemia) develops. What increases the risk? The following factors may make you more likely to develop type 2 diabetes:  Having a family member with type 2 diabetes.  Being overweight or obese.  Having an inactive (sedentary) lifestyle.  Having been diagnosed with insulin resistance.  Having a history of prediabetes, gestational diabetes, or polycystic ovarian syndrome (PCOS).  Being of American-Indian, African-American, Hispanic/Latino, or Asian/Pacific Islander descent.  What are the signs or symptoms? In the early stage of this condition, you may not have symptoms. Symptoms develop slowly and may include:  Increased thirst  (polydipsia).  Increased hunger(polyphagia).  Increased urination (polyuria).  Increased urination during the night (nocturia).  Unexplained weight loss.  Frequent infections that keep coming back (recurring).  Fatigue.  Weakness.  Vision changes, such as blurry vision.  Cuts or bruises that are slow to heal.  Tingling or numbness in the hands or feet.  Dark patches on the skin (acanthosis nigricans).  How is this diagnosed?  This condition is diagnosed based on your symptoms, your medical history, a physical exam, and your blood glucose level. Your blood glucose  may be checked with one or more of the following blood tests:  A fasting blood glucose (FBG) test. You will not be allowed to eat (you will fast) for at least 8 hours before a blood sample is taken.  A random blood glucose test. This checks blood glucose at any time of day regardless of when you ate.  An A1c (hemoglobin A1c) blood test. This provides information about blood glucose control over the previous 2-3 months.  An oral glucose tolerance test (OGTT). This measures your blood glucose at two times: ? After fasting. This is your baseline blood glucose level. ? Two hours after drinking a beverage that contains glucose.  You may be diagnosed with type 2 diabetes if:  Your FBG level is 126 mg/dL (7.0 mmol/L) or higher.  Your random blood glucose level is 200 mg/dL (40.9 mmol/L) or higher.  Your A1c level is 6.5% or higher.  Your OGGT result is higher than 200 mg/dL (81.1 mmol/L).  These blood tests may be repeated to confirm your diagnosis. How is this treated?  Your treatment may be managed by a specialist called an endocrinologist. Type 2 diabetes may be treated by following instructions from your health care provider about:  Making diet and lifestyle changes. This may include: ? Following an individualized nutrition plan that is developed by a diet and nutrition specialist (registered  dietitian). ? Exercising regularly. ? Finding ways to manage stress.  Checking your blood glucose level as often as directed.  Taking diabetes medicines or insulin daily. This helps to keep your blood glucose levels in the healthy range. ? If you use insulin, you may need to adjust the dosage depending on how physically active you are and what foods you eat. Your health care provider will tell you how to adjust your dosage.  Taking medicines to help prevent complications from diabetes, such as: ? Aspirin. ? Medicine to lower cholesterol. ? Medicine to control blood pressure.  Your health care provider will set individualized treatment goals for you. Your goals will be based on your age, other medical conditions you have, and how you respond to diabetes treatment. Generally, the goal of treatment is to maintain the following blood glucose levels:  Before meals (preprandial): 80-130 mg/dL (9.1-4.7 mmol/L).  After meals (postprandial): below 180 mg/dL (10 mmol/L).  A1c level: less than 7%.  Follow these instructions at home: Questions to Ask Your Health Care Provider Consider asking the following questions:  Do I need to meet with a diabetes educator?  Where can I find a support group for people with diabetes?  What equipment will I need to manage my diabetes at home?  What diabetes medicines do I need, and when should I take them?  How often do I need to check my blood glucose?  What number can I call if I have questions?  When is my next appointment?  General instructions  Take over-the-counter and prescription medicines only as told by your health care provider.  Keep all follow-up visits as told by your health care provider. This is important.  For more information about diabetes, visit: ? American Diabetes Association (ADA): www.diabetes.org ? American Association of Diabetes Educators (AADE): www.diabeteseducator.org/patient-resources Contact a health care provider  if:  Your blood glucose is at or above 240 mg/dL (82.9 mmol/L) for 2 days in a row.  You have been sick or have had a fever for 2 days or longer and you are not getting better.  You have any of the following  problems for more than 6 hours: ? You cannot eat or drink. ? You have nausea and vomiting. ? You have diarrhea. Get help right away if:  Your blood glucose is lower than 54 mg/dL (3.0 mmol/L).  You become confused or you have trouble thinking clearly.  You have difficulty breathing.  You have moderate or large ketone levels in your urine. This information is not intended to replace advice given to you by your health care provider. Make sure you discuss any questions you have with your health care provider. Document Released: 07/21/2005 Document Revised: 12/27/2015 Document Reviewed: 08/24/2015 Elsevier Interactive Patient Education  [employer].

## 2017-11-11 LAB — POCT GLYCOSYLATED HEMOGLOBIN (HGB A1C): HEMOGLOBIN A1C: 7.1

## 2017-11-11 NOTE — Addendum Note (Signed)
Addended by: Waymon AmatoJOHNSON, Dean Goldner R on: 11/11/2017 10:36 AM   Modules accepted: Orders

## 2017-11-12 ENCOUNTER — Telehealth: Payer: Self-pay | Admitting: Internal Medicine

## 2017-11-12 NOTE — Telephone Encounter (Signed)
Copied from CRM [id number]. Topic: Quick Communication - See Telephone Encounter >> Nov 12, 2017 11:22 AM Oneal GroutSebastian, Jennifer S wrote: CRM for notification. See Telephone encounter for: 11/12/17. Pharmacy did not receive refill on metFORMIN (GLUCOPHAGE-XR) 500 MG 24 hr tablet, [facility] [address]

## 2017-11-12 NOTE — Telephone Encounter (Signed)
Left message for pt. That Metformin refill went to [facility], not [facility]. Notified she could have [facility] transfer that prescription.

## 2017-12-21 ENCOUNTER — Encounter: Payer: Self-pay | Admitting: Registered"

## 2017-12-21 ENCOUNTER — Encounter: Payer: BLUE CROSS/BLUE SHIELD | Attending: Internal Medicine | Admitting: Registered"

## 2017-12-21 DIAGNOSIS — Z713 Dietary counseling and surveillance: Secondary | ICD-10-CM | POA: Insufficient documentation

## 2017-12-21 DIAGNOSIS — E119 Type 2 diabetes mellitus without complications: Secondary | ICD-10-CM | POA: Insufficient documentation

## 2017-12-21 NOTE — Patient Instructions (Signed)
Plan:  Aim for 45 grams Carbs or less per meal  Aim for 0-15 grams Carbs per snack if hungry  Include protein with your meals and snacks Consider taking your lunch to work on days you are not traveling or going out Consider having a snack in the afternoon and going for a short walk Consider reading food labels for Total Carbohydrate Continue your activity level as tolerated Continue taking medication as directed by MD

## 2017-12-21 NOTE — Progress Notes (Addendum)
Diabetes Self-Management Education  Visit Type: First/Initial  Appt. Start Time: 0810 Appt. End Time: 0930  12/21/2017  [name], identified by name and date of birth, is a 72 y.o. female with a diagnosis of Diabetes: Type 2.   ASSESSMENT Patient is here with newly diagnosed T2DM. Pt states in January her doctor told her that her A1c was fine but getting up there and he recommended she lose some weight. Pt states she started using an app to track her food and has been trying to keep calories to 1200. Pt states she went back to doctor in April due to leg swelling and that is when she got the diagnosis.  Pt states last year she was walking 40 min 5x week with friends until she had knee replacement surgery in July and it became to painful after the surgery. Pt states that she now rides her exercise bike every morning for 25 min and works with [employer] 2x week.  Pt states she has a fast pace life and wants ideas to eat healthy working around her busy life. Pt states she feels where she can make the most improvement is with lunches, rarely takes lunch to work and has frequent lunch meetings where food is provided. Pt states since dx in April she has tried to be careful what she eats, but sometimes she will "break down" the other night pt states she had steak, risoto, shrimp, butter on roll, a small piece of cheese cake. Pt states she stopped eating bananas and also stopped eating olives due to salt.  Sleeps well but doesn't get enough sleep ~7 hrs. Pt states because she eats dinner sometimes as late as 8 pm, does not think she can go to bed any earlier. Pt states she stays busy on weekends, and usually gets up early for farmers market or activities with grandkids.  Patient states she is not aware of vitamin D deficiency, takes Vit D supplement at advice of MDs after having bone density tests. Pt states she experiences silent reflux: hoarseness, cough, trouble with  voice and it is improving and she has cut back on medication.  Pt states she would like follow-up appointment in August so she can report back how she has done with making choices while traveling as well as have an updated A1c.  Diabetes Self-Management Education - 12/21/17 0819      Visit Information   Visit Type  First/Initial      Initial Visit   Diabetes Type  Type 2    Are you currently following a meal plan?  No    Are you taking your medications as prescribed?  Yes    Date Diagnosed  April 2019      Health Coping   How would you rate your overall health?  Good      Psychosocial Assessment   Patient Belief/Attitude about Diabetes  Motivated to manage diabetes    How often do you need to have someone help you when you read instructions, pamphlets, or other written materials from your doctor or pharmacy?  2 - Rarely    What is the last grade level you completed in school?  junior in college      Complications   Last HgB A1C per patient/outside source  7.1 %    How often do you check your blood sugar?  0 times/day (not testing)    Have you had a dilated eye exam in the past 12 months?  Yes  Have you had a dental exam in the past 12 months?  Yes    Are you checking your feet?  No      Dietary Intake   Breakfast  whole wheat toast humus & berries OR avocado, toast, radishes OR egg, spinach, mushroom OR oatmeal berries or oranges    Snack (morning)  none    Lunch  chef salad OR lunch brought (may order 1/2 sandwich, fruit) OR wrap     Snack (afternoon)  cheese & crackers, raw veggies sometimes    Dinner  sweet potatoes, chicken (fish 2x week) broccoli OR tuna salad, cheese, hot dog bun, cranberry sauce OR spaghetti, tossed salad, bread if serving company OR mac&cheese, stewed tomatoes, green beans    Snack (evening)  none    Beverage(s)  water, OJ occassionally, 1 drink with dinner either 1 oz Gin/8 oz diet tonic or red wine      Exercise   Exercise Type  Moderate (swimming  / aerobic walking)    How many days per week to you exercise?  5    How many minutes per day do you exercise?  25    Total minutes per week of exercise  125      Patient Education   Previous Diabetes Education  No    Disease state   Definition of diabetes, type 1 and 2, and the diagnosis of diabetes    Nutrition management   Role of diet in the treatment of diabetes and the relationship between the three main macronutrients and blood glucose level    Physical activity and exercise   Role of exercise on diabetes management, blood pressure control and cardiac health.    Medications  Reviewed patients medication for diabetes, action, purpose, timing of dose and side effects.      Individualized Goals (developed by patient)   Nutrition  General guidelines for healthy choices and portions discussed    Physical Activity  Exercise 5-7 days per week    Medications  take my medication as prescribed      Outcomes   Expected Outcomes  Demonstrated interest in learning. Expect positive outcomes    Future DMSE  3-4 months    Program Status  Not Completed     Individualized Plan for Diabetes Self-Management Training:   Learning Objective:  Patient will have a greater understanding of diabetes self-management. Patient education plan is to attend individual and/or group sessions per assessed needs and concerns.   Patient Instructions  Plan:  Aim for 45 grams Carbs or less per meal  Aim for 0-15 grams Carbs per snack if hungry  Include protein with your meals and snacks Consider taking your lunch to work on days you are not traveling or going out Consider having a snack in the afternoon and going for a short walk Consider reading food labels for Total Carbohydrate Continue your activity level as tolerated Continue taking medication as directed by MD  Expected Outcomes:  Demonstrated interest in learning. Expect positive outcomes  Education material provided: Diabetes: Your Take Control Guide,  Snack sheet, A1c chart  If problems or questions, patient to contact team via:  Phone  Future DSME appointment: 3-4 months

## 2018-01-19 ENCOUNTER — Ambulatory Visit: Payer: BLUE CROSS/BLUE SHIELD | Admitting: Sports Medicine

## 2018-01-19 ENCOUNTER — Encounter: Payer: Self-pay | Admitting: Sports Medicine

## 2018-01-19 VITALS — BP 120/70 | Ht 64.0 in | Wt 165.0 lb

## 2018-01-19 DIAGNOSIS — M1711 Unilateral primary osteoarthritis, right knee: Secondary | ICD-10-CM | POA: Diagnosis not present

## 2018-01-19 DIAGNOSIS — M25561 Pain in right knee: Secondary | ICD-10-CM

## 2018-01-19 NOTE — Progress Notes (Signed)
Chief complaint: Right medial knee pain x8 months  History of present illness: Kathy Herman is a 72 year old female who presents to the sports medicine office today with chief complaint of right medial knee pain.  She reports that symptoms have been present for approximately 8 months now.  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 Despina Hick back in July 2018.  There were no postoperative complications known from the procedure.  She actually did quite well postoperatively up until 8 months ago.  She reports that she was standing at her desk from a seated position when she all of a sudden started to feel sharp pain in her right knee, specifically along the medial aspect.  She did not report feeling any painful popping at that time.  She has not had any painful popping, locking, catching, or symptoms of giving way in her right knee since that time.    She has had a few follow-up appointments with Dr. Despina Hick for this.  She reports that she was advised by him that this was related to tendinitis.  Back in April she did have x-rays which showed good alignment of the hardware with no evidence of loosening.  She was started on gabapentin 300 mg nightly.  Unfortunately, this did not give her any benefit and she quickly stopped the medicine after about a week or so.  She does not report of any fevers, chills, night sweats, or any unintentional weight loss.  She does not report of any warmth, redness in her right knee.  She reports that she wanted to come here today because she is continuing to have pain that is affecting her quality of life, specifically with walking.  She reports not taking any medicines on a daily basis for pain.  She has been using some sort of knee bracing over the last few months, which she reports is slightly helped.  Currently, she describes the pain as a throbbing, aching, and nonradiating pain.  She is also noticing muscle spasming along the  tibialis anterior muscle.  She does not report of any numbness, tingling, or burning paresthesias.  She reports any type of step climbing, bending, squatting, and standing from seated position are main aggravating factors.  Rest otherwise is only alleviating factor.  She has noted intermittent swelling in her right knee.  Review of systems:  As stated above  Her past medical history, surgical history, family history, and social history obtained and reviewed.  Her past medical history is notable for GERD, type 2 diabetes, and right sided cervical radiculopathy; surgical history is notable for tubal ligation; she does not report of any current tobacco use; she does not report a family history of hypertension or type 2 diabetes; allergies and medications are reviewed and are reflected in EMR.  Physical exam: Vital signs are reviewed and are documented in the chart Gen.: Alert, oriented, appears stated age, in no apparent distress HEENT: Moist oral mucosa Respiratory: Normal respirations, able to speak in full sentences Cardiac: Regular rate, distal pulses 2+ Integumentary: No rashes on visible skin:  Neurologic: She does have slight weakness with hip flexion and hip abduction on the right side, would categorize strength as 4+/5, she does have intact strength with right knee flexion and right knee extension, sensation 2+ in bilateral lower extremities Psych: Normal affect, mood is described as good Musculoskeletal: Inspection of her right knee reveals no obvious deformity or muscle atrophy, no warmth, erythema, or ecchymosis noted, she does  have slight puffiness noted in her right knee compared to her left knee, she does have well-healed incisional midline scar, she is tender palpation over the medial joint line and specifically over the proximal tibia, she is also tender to palpation over the anterior lateral quadriceps and IT band, no tenderness over the lateral joint line, slight tenderness over the  tibialis anterior muscle approximately, no signs of ligamentous instability as  Lachman, anterior drawer, valgus, varus stress testing negative, McMurray negative, she does have great range of motion of her right knee going from 0 degrees to 145 degrees  Limited musculoskeletal ultrasound was performed in the office today of her right knee -She does have supraphysiologic suprapatellar effusion noted specifically in the anterolateral portion of the suprapatellar pouch - She does have hypoechoic changes suggestive of effusion along the medial side of the right knee extending 5 cm proximally up the quadriceps from the knee joint and going approximately 8 cm down the tibia - Slight calcific changes seen in the lateral meniscus without any evidence of any hypoechoic changes  Impression: Reactive/inflammatory effusion noted along the medial aspect of the right knee and anterolateral suprapatellar effusion  Ultrasound performed and interpreted by: Haynes Kernshristopher Lake, MD and Juluis RainierKarl B Alexandru Moorer, MD  Assessment and plan: 1. Right knee pain and effusion x8 months 2. 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 Despina HickAlusio  Plan: Discussed with Darl PikesSusan today that based on history, physical exam, and ultrasound, most likely she is getting a reactive/inflammatory effusion still in the medial compartment of the right knee.  Discussed with her that best thing to do would to try to be providing compression for this.  Discussed use of a body helix compression sleeve.  Also discussed combination of both compression and ice, discussed utility of something like an Emergency planning/management officerceman.  Will try this for the next 6 to 8 weeks.  If she is not improved and still has pain with swelling, discussed to have her talk with Dr. Despina HickAlusio to see if a cortisone injection into that area would be beneficial for her.  She can continue to be active and walk as her pain allows.  She will otherwise follow-up here  on as-needed basis.  Greater than 50% of 30 minutes was spent in direct coordination of patient care, counseling, and reviewing outside office notes.    Haynes Kernshristopher Lake, M.D. Primary Care Sports Medicine Fellow [facility]  I observed and examined the patient with the [facility] fellow and agree with assessment and plan.  Note reviewed and modified by me. Enid BaasKarl Howell Groesbeck, MD

## 2018-01-19 NOTE — Assessment & Plan Note (Signed)
Based on my evaluation she has really good flexion and extension of the right knee A review of the x-ray shows good alignment and a normal-appearing unicompartmental knee replacement There is a soft tissue effusion noted in the suprapatellar pouch on x-ray  Ultrasound today shows a fairly significant effusion primarily in the suprapatellar pouch but also extending along the  medial joint capsule  I reassured her that I think she should be very aggressive with icing and compression.  She needs to push stationary biking and exercises to maintain her strength.  Knee replacements often show some inflammatory change during the first year and I think she has significant potential for this to improve.  If not improving I think Dr. Misty StanleyLisa might consider doing cortisone injections to settle down some the inflammatory change.

## 2018-01-20 ENCOUNTER — Encounter: Payer: Self-pay | Admitting: Sports Medicine

## 2018-01-25 ENCOUNTER — Ambulatory Visit: Payer: BLUE CROSS/BLUE SHIELD | Admitting: Internal Medicine

## 2018-01-26 ENCOUNTER — Ambulatory Visit: Payer: BLUE CROSS/BLUE SHIELD | Admitting: Internal Medicine

## 2018-02-25 ENCOUNTER — Ambulatory Visit: Payer: BLUE CROSS/BLUE SHIELD | Admitting: Internal Medicine

## 2018-03-02 ENCOUNTER — Ambulatory Visit: Payer: BLUE CROSS/BLUE SHIELD | Admitting: Sports Medicine

## 2018-03-02 ENCOUNTER — Encounter: Payer: Self-pay | Admitting: Internal Medicine

## 2018-03-02 ENCOUNTER — Ambulatory Visit: Payer: BLUE CROSS/BLUE SHIELD | Admitting: Internal Medicine

## 2018-03-02 VITALS — BP 110/60 | HR 82 | Temp 98.5°F | Wt 173.6 lb

## 2018-03-02 DIAGNOSIS — M1711 Unilateral primary osteoarthritis, right knee: Secondary | ICD-10-CM | POA: Diagnosis not present

## 2018-03-02 DIAGNOSIS — E119 Type 2 diabetes mellitus without complications: Secondary | ICD-10-CM | POA: Diagnosis not present

## 2018-03-02 DIAGNOSIS — K219 Gastro-esophageal reflux disease without esophagitis: Secondary | ICD-10-CM

## 2018-03-02 LAB — POCT GLYCOSYLATED HEMOGLOBIN (HGB A1C): Hemoglobin A1C: 6.2 % — AB (ref 4.0–5.6)

## 2018-03-02 NOTE — Progress Notes (Signed)
Subjective:    Patient ID: Kathy Herman, female    DOB: 1946/02/09, 72 y.o.   MRN: [id number]  HPI  Lab Results  Component Value Date   HGBA1C 7.1 11/11/2017   72 year old patient who is seen today for follow-up of newly diagnosed type 2 diabetes. 3 months ago she was placed on metformin therapy 1000 mg once daily with breakfast which she tolerates well.  She has been seen by nutrition and despite a recent 2-week vacation to [place] there has been some modest weight loss.  She feels well except for some ongoing right knee pain.  She is followed by orthopedics and sports medicine.  Past Medical History:  Diagnosis Date  . Allergic rhinitis, cause unspecified 05/31/2007  . CERVICAL RADICULOPATHY, RIGHT 01/07/2010  . GERD 07/13/2007     Social History   Socioeconomic History  . Marital status: Married    Spouse name: Not on file  . Number of children: Not on file  . Years of education: Not on file  . Highest education level: Not on file  Occupational History  . Not on file  Social Needs  . Financial resource strain: Not on file  . Food insecurity:    Worry: Not on file    Inability: Not on file  . Transportation needs:    Medical: Not on file    Non-medical: Not on file  Tobacco Use  . Smoking status: Never Smoker  . Smokeless tobacco: Never Used  Substance and Sexual Activity  . Alcohol use: Not on file  . Drug use: Not on file  . Sexual activity: Not on file  Lifestyle  . Physical activity:    Days per week: Not on file    Minutes per session: Not on file  . Stress: Not on file  Relationships  . Social connections:    Talks on phone: Not on file    Gets together: Not on file    Attends religious service: Not on file    Active member of club or organization: Not on file    Attends meetings of clubs or organizations: Not on file    Relationship status: Not on file  . Intimate partner violence:    Fear of current or ex partner: Not on file    Emotionally  abused: Not on file    Physically abused: Not on file    Forced sexual activity: Not on file  Other Topics Concern  . Not on file  Social History Narrative  . Not on file    Past Surgical History:  Procedure Laterality Date  . TUBAL LIGATION      No family history on file.  Allergies  Allergen Reactions  . Erythromycin     REACTION: nausea    Current Outpatient Medications on File Prior to Visit  Medication Sig Dispense Refill  . esomeprazole (NEXIUM) 40 MG capsule Take 40 mg by mouth daily at 12 noon.     Marland Kitchen FINASTERIDE PO Take by mouth.    . metFORMIN (GLUCOPHAGE-XR) 500 MG 24 hr tablet Take 2 tablets (1,000 mg total) by mouth daily with breakfast. 180 tablet 3  . Multiple Vitamin (MULTIVITAMIN WITH MINERALS) TABS Take 1 tablet by mouth daily.    . valACYclovir (VALTREX) 1000 MG tablet   0  . Vitamin D, Cholecalciferol, 1000 units TABS Take 1,000 Units by mouth 2 (two) times daily.     No current facility-administered medications on file prior to visit.  BP 110/60 (BP Location: Right Arm, Patient Position: Sitting, Cuff Size: Large)   Pulse 82   Temp 98.5 F (36.9 C) (Oral)   Wt 173 lb 9.6 oz (78.7 kg)   SpO2 93%   BMI 29.80 kg/m     Review of Systems  Constitutional: Negative.   HENT: Negative for congestion, dental problem, hearing loss, rhinorrhea, sinus pressure, sore throat and tinnitus.   Eyes: Negative for pain, discharge and visual disturbance.  Respiratory: Negative for cough and shortness of breath.   Cardiovascular: Negative for chest pain, palpitations and leg swelling.  Gastrointestinal: Negative for abdominal distention, abdominal pain, blood in stool, constipation, diarrhea, nausea and vomiting.  Genitourinary: Negative for difficulty urinating, dysuria, flank pain, frequency, hematuria, pelvic pain, urgency, vaginal bleeding, vaginal discharge and vaginal pain.  Musculoskeletal: Positive for arthralgias. Negative for gait problem and joint  swelling.  Skin: Negative for rash.  Neurological: Negative for dizziness, syncope, speech difficulty, weakness, numbness and headaches.  Hematological: Negative for adenopathy.  Psychiatric/Behavioral: Negative for agitation, behavioral problems and dysphoric mood. The patient is not nervous/anxious.        Objective:   Physical Exam  Constitutional: She appears well-developed and well-nourished. No distress.  Blood pressure low normal          Assessment & Plan:   Diabetes mellitus type 2.  Hemoglobin A1c today 6.2.  No change in therapy.  Nonpharmacologic management discussed and encouraged.  The patient has further follow-up with nutrition.  Follow-up 6 months  Gordy Saverseter F Abeer Iversen

## 2018-03-02 NOTE — Patient Instructions (Signed)
Please check your hemoglobin A1c every 3-6  Months    It is important that you exercise regularly, at least 20 minutes 3 to 4 times per week.  If you develop chest pain or shortness of breath seek  medical attention.  You need to lose weight.  Consider a lower calorie diet and regular exercise.

## 2018-03-04 ENCOUNTER — Encounter: Payer: BLUE CROSS/BLUE SHIELD | Attending: Internal Medicine | Admitting: Registered"

## 2018-03-04 DIAGNOSIS — E119 Type 2 diabetes mellitus without complications: Secondary | ICD-10-CM | POA: Insufficient documentation

## 2018-03-04 DIAGNOSIS — Z713 Dietary counseling and surveillance: Secondary | ICD-10-CM | POA: Diagnosis present

## 2018-03-04 NOTE — Progress Notes (Signed)
Diabetes Self-Management Education  Visit Type: Follow-up  Appt. Start Time: 0805 Appt. End Time: 0835  03/04/2018  [name], identified by name and date of birth, is a 72 y.o. female with a diagnosis of Diabetes:  .   ASSESSMENT Pt states she felt she has done well with managing her diet except for some of her trip to [place] when she drank more alcohol than usual. Per chart A1c is now 6.2% down from 7.1% last visit.  Pt had questions about what to eat for breakfast. Pt states she has silent reflux and is trying to cut back medication. Pt states she controls by avoiding acidic foods and knows coffee can irritate, but really enjoys coffee.  Pt states she pays attention to sodium intake due to swelling in knee. Pt states she has started exercising more, but taking it slow so she doesn't aggravate her knee. Pt states she would really like to eventually get back walking daily with her friends.  Diabetes Self-Management Education - 03/04/18 0807      Visit Information   Visit Type  Follow-up      Complications   Last HgB A1C per patient/outside source  6.2 %      Dietary Intake   Breakfast  1/2 meatloaf (whole wheat bread) sandwich, cantelope      Exercise   Exercise Type  Light (walking / raking leaves)    How many days per week to you exercise?  1      Individualized Goals (developed by patient)   Nutrition  General guidelines for healthy choices and portions discussed    Physical Activity  Exercise 3-5 times per week      Outcomes   Expected Outcomes  Demonstrated interest in learning. Expect positive outcomes    Future DMSE  PRN    Program Status  Not Completed      Subsequent Visit   Since your last visit have you continued or begun to take your medications as prescribed?  Yes    Since your last visit have you had your blood pressure checked?  Yes    Is your most recent blood pressure lower, unchanged, or higher since your last visit?  Unchanged    Since your last  visit have you experienced any weight changes?  Loss    Weight Loss (lbs)  7    Since your last visit, are you checking your blood glucose at least once a day?  No      Individualized Plan for Diabetes Self-Management Training:   Learning Objective:  Patient will have a greater understanding of diabetes self-management. Patient education plan is to attend individual and/or group sessions per assessed needs and concerns.    Patient Instructions   Randie HeinzGreat job on getting your A1c down!  Make sure to have protein with your tomato sandwiches.  Cottage cheese is a good source of protein, just watch the portions size due to sodium. Protein drinks are an options for quick meal replace  Dandelion tea may be a good replacement for coffee. (Dandy blend)  Keep up your exercise   Expected Outcomes:  Demonstrated interest in learning. Expect positive outcomes  Education material provided: A1C conversion sheet  If problems or questions, patient to contact team via:  Phone  Future DSME appointment: PRN

## 2018-03-04 NOTE — Patient Instructions (Signed)
   Great job on getting your A1c down!  Make sure to have protein with your tomato sandwiches.  Cottage cheese is a good source of protein, just watch the portions size due to sodium. Protein drinks are an options for quick meal replace  Dandelion tea may be a good replacement for coffee. (Dandy blend)  Keep up your exercise

## 2018-03-15 ENCOUNTER — Ambulatory Visit: Payer: BLUE CROSS/BLUE SHIELD | Admitting: Internal Medicine

## 2018-03-15 ENCOUNTER — Encounter: Payer: Self-pay | Admitting: Internal Medicine

## 2018-03-15 VITALS — BP 110/60 | HR 87 | Temp 99.0°F | Wt 169.6 lb

## 2018-03-15 DIAGNOSIS — B9789 Other viral agents as the cause of diseases classified elsewhere: Secondary | ICD-10-CM | POA: Diagnosis not present

## 2018-03-15 DIAGNOSIS — J069 Acute upper respiratory infection, unspecified: Secondary | ICD-10-CM | POA: Diagnosis not present

## 2018-03-15 NOTE — Patient Instructions (Signed)
Acute bronchitis symptoms for less than 10 days are generally not helped by antibiotics.  Take over-the-counter expectorants and cough medications such as  Mucinex DM.  Call if there is no improvement in 5 to 7 days or if  you develop worsening cough, fever, or new symptoms, such as shortness of breath or chest pain.   

## 2018-03-15 NOTE — Progress Notes (Signed)
Subjective:    Patient ID: Kathy MelnickSusan S Herman, female    DOB: 02-26-1946, 72 y.o.   MRN: [id number]  HPI  72 year old patient who has a history of type 2 diabetes.  She presents with a 3-day history of low-grade fever sore throat chest congestion and cough.  She has had some myalgias occasional headaches and faint wheezing.  Past Medical History:  Diagnosis Date  . Allergic rhinitis, cause unspecified 05/31/2007  . CERVICAL RADICULOPATHY, RIGHT 01/07/2010  . GERD 07/13/2007     Social History   Socioeconomic History  . Marital status: Married    Spouse name: Not on file  . Number of children: Not on file  . Years of education: Not on file  . Highest education level: Not on file  Occupational History  . Not on file  Social Needs  . Financial resource strain: Not on file  . Food insecurity:    Worry: Not on file    Inability: Not on file  . Transportation needs:    Medical: Not on file    Non-medical: Not on file  Tobacco Use  . Smoking status: Never Smoker  . Smokeless tobacco: Never Used  Substance and Sexual Activity  . Alcohol use: Not on file  . Drug use: Not on file  . Sexual activity: Not on file  Lifestyle  . Physical activity:    Days per week: Not on file    Minutes per session: Not on file  . Stress: Not on file  Relationships  . Social connections:    Talks on phone: Not on file    Gets together: Not on file    Attends religious service: Not on file    Active member of club or organization: Not on file    Attends meetings of clubs or organizations: Not on file    Relationship status: Not on file  . Intimate partner violence:    Fear of current or ex partner: Not on file    Emotionally abused: Not on file    Physically abused: Not on file    Forced sexual activity: Not on file  Other Topics Concern  . Not on file  Social History Narrative  . Not on file    Past Surgical History:  Procedure Laterality Date  . TUBAL LIGATION      History  reviewed. No pertinent family history.  Allergies  Allergen Reactions  . Erythromycin     REACTION: nausea    Current Outpatient Medications on File Prior to Visit  Medication Sig Dispense Refill  . esomeprazole (NEXIUM) 40 MG capsule Take 40 mg by mouth daily at 12 noon.     Marland Kitchen. FINASTERIDE PO Take by mouth.    . metFORMIN (GLUCOPHAGE-XR) 500 MG 24 hr tablet Take 2 tablets (1,000 mg total) by mouth daily with breakfast. 180 tablet 3  . Multiple Vitamin (MULTIVITAMIN WITH MINERALS) TABS Take 1 tablet by mouth daily.    . valACYclovir (VALTREX) 1000 MG tablet   0  . Vitamin D, Cholecalciferol, 1000 units TABS Take 1,000 Units by mouth 2 (two) times daily.     No current facility-administered medications on file prior to visit.     BP 110/60 (BP Location: Right Arm, Patient Position: Sitting, Cuff Size: Large)   Pulse 87   Temp 99 F (37.2 C) (Oral)   Wt 169 lb 9.6 oz (76.9 kg)   SpO2 96%   BMI 29.11 kg/m     Review of Systems  Constitutional: Positive for activity change, appetite change, fatigue and fever. Negative for diaphoresis.  HENT: Positive for congestion, sinus pressure and sore throat. Negative for dental problem, hearing loss, rhinorrhea and tinnitus.   Eyes: Negative for pain, discharge and visual disturbance.  Respiratory: Positive for cough. Negative for shortness of breath.   Cardiovascular: Negative for chest pain, palpitations and leg swelling.  Gastrointestinal: Negative for abdominal distention, abdominal pain, blood in stool, constipation, diarrhea, nausea and vomiting.  Genitourinary: Negative for difficulty urinating, dysuria, flank pain, frequency, hematuria, pelvic pain, urgency, vaginal bleeding, vaginal discharge and vaginal pain.  Musculoskeletal: Negative for arthralgias, gait problem and joint swelling.  Skin: Negative for rash.  Neurological: Positive for headaches. Negative for dizziness, syncope, speech difficulty, weakness and numbness.    Hematological: Negative for adenopathy.  Psychiatric/Behavioral: Negative for agitation, behavioral problems and dysphoric mood. The patient is not nervous/anxious.        Objective:   Physical Exam  Constitutional: She is oriented to person, place, and time. She appears well-developed and well-nourished. She does not appear ill. No distress.  Temperature 99 degrees  HENT:  Head: Normocephalic.  Right Ear: External ear normal.  Left Ear: External ear normal.  Oropharynx slightly erythematous without exudate  Eyes: Pupils are equal, round, and reactive to light. Conjunctivae and EOM are normal.  Neck: Normal range of motion. Neck supple. No thyromegaly present.  Cardiovascular: Normal rate, regular rhythm, normal heart sounds and intact distal pulses.  Pulmonary/Chest: Effort normal and breath sounds normal. She has no wheezes. She has no rales.  Abdominal: Soft. Bowel sounds are normal. She exhibits no mass. There is no tenderness.  Musculoskeletal: Normal range of motion.  Lymphadenopathy:    She has no cervical adenopathy.  Neurological: She is alert and oriented to person, place, and time.  Skin: Skin is warm and dry. No rash noted.  Psychiatric: She has a normal mood and affect. Her behavior is normal.          Assessment & Plan:   Viral URI with cough and pharyngitis.  Will continue symptomatic treatment.  Will encourage liberal fluid intake Diabetes mellitus type 2 stable  Patient will report any new or worsening symptoms  Gordy SaversPeter F Kwiatkowski

## 2018-03-16 ENCOUNTER — Ambulatory Visit: Payer: BLUE CROSS/BLUE SHIELD | Admitting: Sports Medicine

## 2018-03-16 ENCOUNTER — Encounter: Payer: Self-pay | Admitting: Sports Medicine

## 2018-03-16 DIAGNOSIS — M1711 Unilateral primary osteoarthritis, right knee: Secondary | ICD-10-CM

## 2018-03-16 NOTE — Patient Instructions (Addendum)
-   continue to use ice as needed - can use compression, especially with walking - avoid use of resistance bands; can use very light resistance with machines or weights; avoid resistance with adduction exercises - incorporate exercises as below: 1) Straight leg adduction - can lay down on right side; keep leg straight and lift up  2) Straight leg raises - lay flat on back; keep leg straight and lift up 3) Hacky sack - stand on one leg; bend knee and hip of the leg and then rotate inward

## 2018-03-16 NOTE — Assessment & Plan Note (Signed)
Has developed some pes anserine bursitis I think this is more related to the training activity I believe the partial KR looks quite good  Use ice Compression Modify exercise regimen Avoid adductor stress or too much knee flexion with resistance  Reck 6 wks if needed

## 2018-03-16 NOTE — Progress Notes (Signed)
   Subjective:    Patient ID: Kathy MelnickSusan S Herman, female    DOB: 1945-08-10, 72 y.o.   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  Last seen in our clinic 01/19/18. Ultrasound at that time revealing effusion from suprapatellar pouch extending down medial aspect of knee. She was recommended to use compression/ice and continue stationary biking.  Patient reports that compression and ice has helped in terms of pain but the improvement is only brief. She has not been taking any OTC meds. Was seen by Dr. Lequita HaltAluisio last week and received steroid injection for suspected pes anserine bursitis. Patient reports significant improvement in pain after the injection. Reports that swelling was also improved. Denies any joint warmth, redness, fever/chills.  She has been riding stationary, recumbent bike for 20-30 min daily (no pain). Also working with trainer 2-3x per week. Doing a lot of LE exercises with resistance bands. Denies any pain during exercise.  Review of Systems per HPI     Objective:   Physical Exam  General - well-appearing, NAD MSK -  Right knee - Well-healed scar over anterior knee. No warmth, erythema or ecchymosis. No significant swelling. No medial or lateral joint line tenderness. Maximal tenderness over pes anserinus. Full extension and flexion. Strength 5/5. No varus or valgus laxity. No significant anterior/posterior translation.   [facility] ultrasound No effusion noted in suprapatellar region. Some evidence of decreased VMO mass along with mild hypoechoic changes along tendon. No significant effusion noted along medial or lateral joint line. Pes anserine bursa with some associated effusion. No significant neovascularity.      Assessment & Plan:   Medial knee pain which has significantly improved following recent steroid injection for pes anserine bursitis. She still has some exam and  ultrasound findings that suggest mild ongoing inflammation at pes anserine insertion. Discussed modification of her exercise regimen - avoid resistance bands; incorporate new exercises (SLRs, hacky sack, SL add). Continue ice and compression.   Myriam Jacobsonhomas Kellam, PGY-3  I observed and examined the patient with the resident and agree with assessment and plan.  Note reviewed and modified by me. Enid BaasKarl Catrena Vari, MD

## 2018-08-19 ENCOUNTER — Ambulatory Visit: Payer: BLUE CROSS/BLUE SHIELD | Admitting: Internal Medicine

## 2018-08-19 ENCOUNTER — Encounter: Payer: Self-pay | Admitting: Internal Medicine

## 2018-08-19 VITALS — BP 130/64 | HR 73 | Temp 98.0°F | Wt 174.0 lb

## 2018-08-19 DIAGNOSIS — K219 Gastro-esophageal reflux disease without esophagitis: Secondary | ICD-10-CM

## 2018-08-19 DIAGNOSIS — E119 Type 2 diabetes mellitus without complications: Secondary | ICD-10-CM | POA: Diagnosis not present

## 2018-08-19 DIAGNOSIS — M1711 Unilateral primary osteoarthritis, right knee: Secondary | ICD-10-CM

## 2018-08-19 DIAGNOSIS — E1169 Type 2 diabetes mellitus with other specified complication: Secondary | ICD-10-CM

## 2018-08-19 DIAGNOSIS — E785 Hyperlipidemia, unspecified: Secondary | ICD-10-CM

## 2018-08-19 LAB — POCT GLYCOSYLATED HEMOGLOBIN (HGB A1C): Hemoglobin A1C: 6.2 % — AB (ref 4.0–5.6)

## 2018-08-19 NOTE — Progress Notes (Signed)
Established Patient Office Visit     CC/Reason for Visit: Establish care, follow-up on chronic medical conditions  HPI: Kathy Herman is a 74 y.o. female who is coming in today for the above mentioned reasons.  She is now due for her annual physical exam.  Past Medical History is significant for: GERD that is well controlled on Nexium, however she has a long history of coughing spasms and hoarseness of the voice especially if she misses a dose or 2.  She also has a history of recently diagnosed diabetes in April of last year, recently started on metformin that has been well controlled.  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 Despina Hick, has had continued pain and is currently being evaluated by orthopedics for may be a redo total knee replacement.  She has had some success with weight loss but seems motivated to lose more she does not want to take metformin forever.  She tells me that for the past 2 weeks at around 1130 she has been nauseous, this resolves after lunch, denies dizziness, palpitations, diaphoresis.  She does not check blood sugars.   Past Medical/Surgical History: Past Medical History:  Diagnosis Date  . Allergic rhinitis, cause unspecified 05/31/2007  . CERVICAL RADICULOPATHY, RIGHT 01/07/2010  . GERD 07/13/2007    Past Surgical History:  Procedure Laterality Date  . TUBAL LIGATION      Social History:  reports that she has never smoked. She has never used smokeless tobacco. No history on file for alcohol and drug.  Allergies: Allergies  Allergen Reactions  . Erythromycin     REACTION: nausea    Family History:  No family history of cancer, heart disease, stroke  Current Outpatient Medications:  .  esomeprazole (NEXIUM) 40 MG capsule, Take 40 mg by mouth daily at 12 noon. , Disp: , Rfl:  .  FINASTERIDE PO, Take by mouth., Disp: , Rfl:  .  metFORMIN (GLUCOPHAGE-XR) 500 MG 24 hr tablet, Take 2 tablets  (1,000 mg total) by mouth daily with breakfast., Disp: 180 tablet, Rfl: 3 .  Multiple Vitamin (MULTIVITAMIN WITH MINERALS) TABS, Take 1 tablet by mouth daily., Disp: , Rfl:  .  valACYclovir (VALTREX) 1000 MG tablet, , Disp: , Rfl: 0 .  Vitamin D, Cholecalciferol, 1000 units TABS, Take 1,000 Units by mouth 2 (two) times daily., Disp: , Rfl:   Review of Systems:  Constitutional: Denies fever, chills, diaphoresis, appetite change and fatigue.  HEENT: Denies photophobia, eye pain, redness, hearing loss, ear pain, congestion, sore throat, rhinorrhea, sneezing, mouth sores, trouble swallowing, neck pain, neck stiffness and tinnitus.   Respiratory: Denies SOB, DOE, cough, chest tightness,  and wheezing.   Cardiovascular: Denies chest pain, palpitations and leg swelling.  Gastrointestinal: Denies nausea, vomiting, abdominal pain, diarrhea, constipation, blood in stool and abdominal distention.  Genitourinary: Denies dysuria, urgency, frequency, hematuria, flank pain and difficulty urinating.  Endocrine: Denies: hot or cold intolerance, sweats, changes in hair or nails, polyuria, polydipsia. Musculoskeletal: Denies myalgias, back pain, joint swelling, arthralgias and gait problem.  Skin: Denies pallor, rash and wound.  Neurological: Denies dizziness, seizures, syncope, weakness, light-headedness, numbness and headaches.  Hematological: Denies adenopathy. Easy bruising, personal or family bleeding history  Psychiatric/Behavioral: Denies suicidal ideation, mood changes, confusion, nervousness, sleep disturbance and agitation    Physical Exam: Vitals:   08/19/18 1621  BP: 130/64  Pulse: 73  Temp: 98 F (36.7 C)  TempSrc: Oral  SpO2: 98%  Weight: 174 lb (78.9 kg)    Body mass index is 29.87 kg/m.   Constitutional: NAD, calm, comfortable Eyes: PERRL, lids and conjunctivae normal ENMT: Mucous membranes are moist. Respiratory: clear to auscultation bilaterally, no wheezing, no crackles.  Normal respiratory effort. No accessory muscle use.  Cardiovascular: Regular rate and rhythm, no murmurs / rubs / gallops. No extremity edema. 2+ pedal pulses. No carotid bruits.  Musculoskeletal: no clubbing / cyanosis. No joint deformity upper and lower extremities. Good ROM, no contractures. Normal muscle tone.  Neurologic: CN 2-12 grossly intact. Sensation intact, DTR normal. Strength 5/5 in all 4.  Psychiatric: Normal judgment and insight. Alert and oriented x 3. Normal mood.    Impression and Plan:  Controlled type 2 diabetes mellitus without complication, without long-term current use of insulin (HCC)  -A1c today demonstrates good control at 6.2. -Plan to continue metformin at current dose of 1000 mg twice daily. -I wonder if her pre-lunch nausea might be due to transient hypoglycemia. -Have advised that she take a midmorning snack to see if this helps relieve symptoms. -If she continues to have nausea I will suggest that she check her blood sugar at that time, if she is actually hypoglycemic may necessitate metformin dose reduction.  Gastroesophageal reflux disease without esophagitis -Is on is omeprazole, is asking whether I feel comfortable assuming [facility] medication [facility].  Primary osteoarthritis of right knee -Follow-up with orthopedics for consideration of total knee replacement.  Hyperlipidemia associated with type 2 diabetes mellitus (HCC) -In January 2019 her LDL was 104 which is above goal for diabetic. -She will be returning soon for repeat lipids, will determine at that time whether statin therapy is needed.    Patient Instructions  -It was nice to meet you today!  -Try eating something around 10:30 to see if that helps your nausea. Let me know if you continue to have issues.  -Schedule follow up as soon as possible for your annual physical. Please come in fasting to that appointment.     Kathy JanEstela  Hernandez Acosta, MD [facility]

## 2018-08-19 NOTE — Patient Instructions (Signed)
-  It was nice to meet you today!  -Try eating something around 10:30 to see if that helps your nausea. Let me know if you continue to have issues.  -Schedule follow up as soon as possible for your annual physical. Please come in fasting to that appointment.

## 2018-08-20 ENCOUNTER — Other Ambulatory Visit (HOSPITAL_COMMUNITY): Payer: Self-pay | Admitting: Orthopedic Surgery

## 2018-08-20 DIAGNOSIS — Z96651 Presence of right artificial knee joint: Secondary | ICD-10-CM

## 2018-08-30 ENCOUNTER — Encounter (HOSPITAL_COMMUNITY)
Admission: RE | Admit: 2018-08-30 | Discharge: 2018-08-30 | Disposition: A | Discharge status: Home / Self Care | Payer: BLUE CROSS/BLUE SHIELD | Source: Ambulatory Visit | Attending: Orthopedic Surgery | Admitting: Orthopedic Surgery

## 2018-08-30 DIAGNOSIS — Z96651 Presence of right artificial knee joint: Secondary | ICD-10-CM | POA: Diagnosis present

## 2018-08-30 MED ORDER — TECHNETIUM TC 99M MEBROFENIN IV KIT
5.0000 | PACK | Freq: Once | INTRAVENOUS | Status: AC | PRN
  Administered 2018-08-30: 5 via INTRAVENOUS

## 2018-09-02 ENCOUNTER — Ambulatory Visit (INDEPENDENT_AMBULATORY_CARE_PROVIDER_SITE_OTHER): Payer: BLUE CROSS/BLUE SHIELD | Admitting: Internal Medicine

## 2018-09-02 ENCOUNTER — Encounter: Payer: Self-pay | Admitting: Internal Medicine

## 2018-09-02 ENCOUNTER — Other Ambulatory Visit: Payer: Self-pay | Admitting: Internal Medicine

## 2018-09-02 VITALS — BP 102/72 | HR 76 | Temp 97.8°F | Ht 63.5 in | Wt 176.2 lb

## 2018-09-02 DIAGNOSIS — E785 Hyperlipidemia, unspecified: Principal | ICD-10-CM

## 2018-09-02 DIAGNOSIS — Z23 Encounter for immunization: Secondary | ICD-10-CM

## 2018-09-02 DIAGNOSIS — E119 Type 2 diabetes mellitus without complications: Secondary | ICD-10-CM | POA: Diagnosis not present

## 2018-09-02 DIAGNOSIS — Z0000 Encounter for general adult medical examination without abnormal findings: Secondary | ICD-10-CM

## 2018-09-02 DIAGNOSIS — Z0001 Encounter for general adult medical examination with abnormal findings: Secondary | ICD-10-CM

## 2018-09-02 DIAGNOSIS — H6121 Impacted cerumen, right ear: Secondary | ICD-10-CM

## 2018-09-02 DIAGNOSIS — E1169 Type 2 diabetes mellitus with other specified complication: Secondary | ICD-10-CM | POA: Diagnosis not present

## 2018-09-02 LAB — COMPREHENSIVE METABOLIC PANEL
ALT: 26 U/L (ref 0–35)
AST: 20 U/L (ref 0–37)
Albumin: 4.3 g/dL (ref 3.5–5.2)
Alkaline Phosphatase: 75 U/L (ref 39–117)
BILIRUBIN TOTAL: 0.6 mg/dL (ref 0.2–1.2)
BUN: 14 mg/dL (ref 6–23)
CO2: 27 meq/L (ref 19–32)
Calcium: 10.8 mg/dL — ABNORMAL HIGH (ref 8.4–10.5)
Chloride: 107 mEq/L (ref 96–112)
Creatinine, Ser: 0.7 mg/dL (ref 0.40–1.20)
GFR: 82.15 mL/min (ref >60.00)
GLUCOSE: 132 mg/dL — AB (ref 70–99)
Potassium: 4.9 mEq/L (ref 3.5–5.1)
Sodium: 141 mEq/L (ref 135–145)
Total Protein: 6.4 g/dL (ref 6.0–8.3)

## 2018-09-02 LAB — LIPID PANEL
CHOL/HDL RATIO: 3
Cholesterol: 180 mg/dL (ref 0–200)
HDL: 51.8 mg/dL (ref >39.00)
LDL Cholesterol: 112 mg/dL — ABNORMAL HIGH (ref 0–99)
NONHDL: 127.74
Triglycerides: 80 mg/dL (ref 0.0–149.0)
VLDL: 16 mg/dL (ref 0.0–40.0)

## 2018-09-02 LAB — CBC WITH DIFFERENTIAL/PLATELET
BASOS ABS: 0.1 10*3/uL (ref 0.0–0.1)
BASOS PCT: 1.3 % (ref 0.0–3.0)
EOS PCT: 2.3 % (ref 0.0–5.0)
Eosinophils Absolute: 0.2 10*3/uL (ref 0.0–0.7)
HEMATOCRIT: 42 % (ref 36.0–46.0)
HEMOGLOBIN: 14.1 g/dL (ref 12.0–15.0)
Lymphocytes Relative: 27 % (ref 12.0–46.0)
Lymphs Abs: 1.8 10*3/uL (ref 0.7–4.0)
MCHC: 33.6 g/dL (ref 30.0–36.0)
MCV: 89.3 fl (ref 78.0–100.0)
MONOS PCT: 6.3 % (ref 3.0–12.0)
Monocytes Absolute: 0.4 10*3/uL (ref 0.1–1.0)
NEUTROS ABS: 4.3 10*3/uL (ref 1.4–7.7)
Neutrophils Relative %: 63.1 % (ref 43.0–77.0)
PLATELETS: 259 10*3/uL (ref 150.0–400.0)
RBC: 4.7 Mil/uL (ref 3.87–5.11)
RDW: 13.5 % (ref 11.5–15.5)
WBC: 6.8 10*3/uL (ref 4.0–10.5)

## 2018-09-02 LAB — TSH: TSH: 1.7 u[IU]/mL (ref 0.35–4.50)

## 2018-09-02 LAB — VITAMIN D 25 HYDROXY (VIT D DEFICIENCY, FRACTURES): VITD: 53.02 ng/mL (ref 30.00–100.00)

## 2018-09-02 LAB — VITAMIN B12: VITAMIN B 12: 279 pg/mL (ref 211–911)

## 2018-09-02 LAB — MICROALBUMIN / CREATININE URINE RATIO
CREATININE, U: 51.6 mg/dL
Microalb Creat Ratio: 1.4 mg/g (ref 0.0–30.0)
Microalb, Ur: <0.7 mg/dL (ref 0.0–1.9)

## 2018-09-02 MED ORDER — SIMVASTATIN 40 MG PO TABS: 40.0000 mg | ORAL_TABLET | Freq: Every day | ORAL | 3 refills | Status: DC

## 2018-09-02 MED ORDER — ASPIRIN EC 81 MG PO TBEC: 81.0000 mg | DELAYED_RELEASE_TABLET | Freq: Every day | ORAL | Status: DC

## 2018-09-02 NOTE — Progress Notes (Signed)
   Established Patient Office Visit     CC/Reason for Visit: Annual preventive exam  HPI: Kathy Herman is a 73 y.o. female who is coming in today for the above mentioned reasons. Past Medical History is significant for: GERD that is well controlled on Nexium, however she has a long history of coughing spasms and hoarseness of the voice especially if she misses a dose or 2.  She also has a history of recently diagnosed diabetes in April of last year, recently started on metformin that has been well controlled.  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 Alusio, has had continued pain and is currently being evaluated by orthopedics for may be a redo total knee replacement.  The nausea that she was experiencing right before lunch has improved with the suggestion of having a small snack about half an hour before his episodes tend to occur.  She has not been checking her blood sugars at that time.  She will get second pneumonia vaccine today, otherwise is up-to-date on immunizations.  She is up-to-date on colon and breast cancer screening, she had bone mineral density last year.  She has routine eye and dental care.  She is concerned about her hearing, would like me to look into her ears today and possibly recommend an audiologist.  We have discussed starting baby aspirin today, she agrees.  We have also discussed possibility of starting a statin pending lipids that will be drawn today.  Diabetic foot exam will be performed today.   Past Medical/Surgical History: Past Medical History:  Diagnosis Date  . Allergic rhinitis, cause unspecified 05/31/2007  . CERVICAL RADICULOPATHY, RIGHT 01/07/2010  . GERD 07/13/2007    Past Surgical History:  Procedure Laterality Date  . TUBAL LIGATION      Social History:  reports that she has never smoked. She has never used smokeless tobacco. No history on file for alcohol and  drug.  Allergies: Allergies  Allergen Reactions  . Erythromycin     REACTION: nausea    Family History:  No history of cancer, heart disease or stroke.  Current Outpatient Medications:  .  esomeprazole (NEXIUM) 40 MG capsule, Take 40 mg by mouth daily at 12 noon. , Disp: , Rfl:  .  FINASTERIDE PO, Take by mouth., Disp: , Rfl:  .  metFORMIN (GLUCOPHAGE-XR) 500 MG 24 hr tablet, Take 2 tablets (1,000 mg total) by mouth daily with breakfast., Disp: 180 tablet, Rfl: 3 .  Multiple Vitamin (MULTIVITAMIN WITH MINERALS) TABS, Take 1 tablet by mouth daily., Disp: , Rfl:  .  valACYclovir (VALTREX) 1000 MG tablet, , Disp: , Rfl: 0 .  Vitamin D, Cholecalciferol, 1000 units TABS, Take 1,000 Units by mouth 2 (two) times daily., Disp: , Rfl:  .  aspirin EC 81 MG tablet, Take 1 tablet (81 mg total) by mouth daily., Disp: , Rfl:   Review of Systems:  Constitutional: Denies fever, chills, diaphoresis, appetite change and fatigue.  HEENT: Denies photophobia, eye pain, redness, hearing loss, ear pain, congestion, sore throat, rhinorrhea, sneezing, mouth sores, trouble swallowing, neck pain, neck stiffness and tinnitus.   Respiratory: Denies SOB, DOE, cough, chest tightness,  and wheezing.   Cardiovascular: Denies chest pain, palpitations and leg swelling.  Gastrointestinal: Denies nausea, vomiting, abdominal pain, diarrhea, constipation, blood in stool and abdominal distention.  Genitourinary: Denies dysuria, urgency, frequency, hematuria, flank pain and difficulty urinating.  Endocrine: Denies: hot or cold intolerance,   sweats, changes in hair or nails, polyuria, polydipsia. Musculoskeletal: Denies myalgias, back pain, joint swelling, arthralgias and gait problem.  Skin: Denies pallor, rash and wound.  Neurological: Denies dizziness, seizures, syncope, weakness, light-headedness, numbness and headaches.  Hematological: Denies adenopathy. Easy bruising, personal or family bleeding history   Psychiatric/Behavioral: Denies suicidal ideation, mood changes, confusion, nervousness, sleep disturbance and agitation    Physical Exam: Vitals:   09/02/18 0844  BP: 102/72  Pulse: 76  Temp: 97.8 F (36.6 C)  TempSrc: Oral  SpO2: 96%  Weight: 176 lb 3.2 oz (79.9 kg)  Height: 5' 3.5" (1.613 m)    Body mass index is 30.72 kg/m.   Constitutional: NAD, calm, comfortable Eyes: PERRL, lids and conjunctivae normal ENMT: Mucous membranes are moist. Posterior pharynx clear of any exudate or lesions. Normal dentition. Tympanic membrane is pearly white, no erythema or bulging on the left, on the right she has significant amount of earwax that limits visualization of tympanic membrane. Neck: normal, supple, no masses, no thyromegaly Respiratory: clear to auscultation bilaterally, no wheezing, no crackles. Normal respiratory effort. No accessory muscle use.  Cardiovascular: Regular rate and rhythm, no murmurs / rubs / gallops. No extremity edema. 2+ pedal pulses. No carotid bruits.  Abdomen: no tenderness, no masses palpated. No hepatosplenomegaly. Bowel sounds positive.  Musculoskeletal: no clubbing / cyanosis. No joint deformity upper and lower extremities. Good ROM, no contractures. Normal muscle tone.  Skin: no rashes, lesions, ulcers. No induration Neurologic: CN 2-12 grossly intact. Sensation intact, DTR normal. Strength 5/5 in all 4.  Psychiatric: Normal judgment and insight. Alert and oriented x 3. Normal mood.    Impression and Plan:  Encounter for preventive health examination -Up-to-date on age-appropriate cancer screening. -Will receive Pneumovax today, otherwise up-to-date on immunizations. -She is up-to-date on eye and dental care, will obtain results of last diabetic eye exam to keep in the chart. -She had DEXA scan in January 2019. -She has concerns about her hearing, has a right cerumen impaction which was irrigated today with good results.  She has been advised that  if she continues to have issues with hearing can refer her to an audiologist. -Lab work will be done today.  Controlled type 2 diabetes mellitus without complication, without long-term current use of insulin (HCC) -Last A1c was 6.2 earlier this month. -Continues on metformin only. -Diabetic foot exam was performed today, only abnormality is hammertoes on the left for which she is already seeing podiatry and they are considering surgery.  Hyperlipidemia associated with type 2 diabetes mellitus (HCC)  -Lipids will be drawn today, have discussed likely initiation of statin if her LDL is above 70, she agrees. -She has also been counseled on lifestyle modifications.  Need for vaccination for pneumococcus - Plan: Pneumococcal polysaccharide vaccine 23-valent greater than or equal to 2yo subcutaneous/IM  Impacted cerumen of right ear -Irrigated with good response.    Patient Instructions  -Nice seeing you today!  -Pneumonia vaccine today.  -Lab work today. We will notify you when results are available.  -Start taking Aspirin 81 mg daily.   Preventive Care 65 Years and Older, Female Preventive care refers to lifestyle choices and visits with your health care provider that can promote health and wellness. What does preventive care include?  A yearly physical exam. This is also called an annual well check.  Dental exams once or twice a year.  Routine eye exams. Ask your health care provider how often you should have your eyes checked.  Personal lifestyle   choices, including: ? Daily care of your teeth and gums. ? Regular physical activity. ? Eating a healthy diet. ? Avoiding tobacco and drug use. ? Limiting alcohol use. ? Practicing safe sex. ? Taking low-dose aspirin every day. ? Taking vitamin and mineral supplements as recommended by your health care provider. What happens during an annual well check? The services and screenings done by your health care provider during your  annual well check will depend on your age, overall health, lifestyle risk factors, and family history of disease. Counseling Your health care provider may ask you questions about your:  Alcohol use.  Tobacco use.  Drug use.  Emotional well-being.  Home and relationship well-being.  Sexual activity.  Eating habits.  History of falls.  Memory and ability to understand (cognition).  Work and work Statistician.  Reproductive health.  Screening You may have the following tests or measurements:  Height, weight, and BMI.  Blood pressure.  Lipid and cholesterol levels. These may be checked every 5 years, or more frequently if you are over 25 years old.  Skin check.  Lung cancer screening. You may have this screening every year starting at age 45 if you have a 30-pack-year history of smoking and currently smoke or have quit within the past 15 years.  Colorectal cancer screening. All adults should have this screening starting at age 26 and continuing until age 58. You will have tests every 1-10 years, depending on your results and the type of screening test. People at increased risk should start screening at an earlier age. Screening tests may include: ? Guaiac-based fecal occult blood testing. ? Fecal immunochemical test (FIT). ? Stool DNA test. ? Virtual colonoscopy. ? Sigmoidoscopy. During this test, a flexible tube with a tiny camera (sigmoidoscope) is used to examine your rectum and lower colon. The sigmoidoscope is inserted through your anus into your rectum and lower colon. ? Colonoscopy. During this test, a long, thin, flexible tube with a tiny camera (colonoscope) is used to examine your entire colon and rectum.  Hepatitis C blood test.  Hepatitis B blood test.  Sexually transmitted disease (STD) testing.  Diabetes screening. This is done by checking your blood sugar (glucose) after you have not eaten for a while (fasting). You may have this done every 1-3  years.  Bone density scan. This is done to screen for osteoporosis. You may have this done starting at age 74.  Mammogram. This may be done every 1-2 years. Talk to your health care provider about how often you should have regular mammograms. Talk with your health care provider about your test results, treatment options, and if necessary, the need for more tests. Vaccines Your health care provider may recommend certain vaccines, such as:  Influenza vaccine. This is recommended every year.  Tetanus, diphtheria, and acellular pertussis (Tdap, Td) vaccine. You may need a Td booster every 10 years.  Varicella vaccine. You may need this if you have not been vaccinated.  Zoster vaccine. You may need this after age 64.  Measles, mumps, and rubella (MMR) vaccine. You may need at least one dose of MMR if you were born in 1957 or later. You may also need a second dose.  Pneumococcal 13-valent conjugate (PCV13) vaccine. One dose is recommended after age 55.  Pneumococcal polysaccharide (PPSV23) vaccine. One dose is recommended after age 1.  Meningococcal vaccine. You may need this if you have certain conditions.  Hepatitis A vaccine. You may need this if you have certain conditions or if  you travel or work in places where you may be exposed to hepatitis A.  Hepatitis B vaccine. You may need this if you have certain conditions or if you travel or work in places where you may be exposed to hepatitis B.  Haemophilus influenzae type b (Hib) vaccine. You may need this if you have certain conditions. Talk to your health care provider about which screenings and vaccines you need and how often you need them. This information is not intended to replace advice given to you by your health care provider. Make sure you discuss any questions you have with your health care provider. Document Released: 08/17/2015 Document Revised: 09/10/2017 Document Reviewed: 05/22/2015 Elsevier Interactive Patient Education   2019 [place], [place] [facility]

## 2018-09-02 NOTE — Patient Instructions (Signed)
-Nice seeing you today!  -Pneumonia vaccine today.  -Lab work today. We will notify you when results are available.  -Start taking Aspirin 81 mg daily.   Preventive Care 39 Years and Older, Female Preventive care refers to lifestyle choices and visits with your health care provider that can promote health and wellness. What does preventive care include?  A yearly physical exam. This is also called an annual well check.  Dental exams once or twice a year.  Routine eye exams. Ask your health care provider how often you should have your eyes checked.  Personal lifestyle choices, including: ? Daily care of your teeth and gums. ? Regular physical activity. ? Eating a healthy diet. ? Avoiding tobacco and drug use. ? Limiting alcohol use. ? Practicing safe sex. ? Taking low-dose aspirin every day. ? Taking vitamin and mineral supplements as recommended by your health care provider. What happens during an annual well check? The services and screenings done by your health care provider during your annual well check will depend on your age, overall health, lifestyle risk factors, and family history of disease. Counseling Your health care provider may ask you questions about your:  Alcohol use.  Tobacco use.  Drug use.  Emotional well-being.  Home and relationship well-being.  Sexual activity.  Eating habits.  History of falls.  Memory and ability to understand (cognition).  Work and work Statistician.  Reproductive health.  Screening You may have the following tests or measurements:  Height, weight, and BMI.  Blood pressure.  Lipid and cholesterol levels. These may be checked every 5 years, or more frequently if you are over 33 years old.  Skin check.  Lung cancer screening. You may have this screening every year starting at age 21 if you have a 30-pack-year history of smoking and currently smoke or have quit within the past 15 years.  Colorectal cancer  screening. All adults should have this screening starting at age 14 and continuing until age 86. You will have tests every 1-10 years, depending on your results and the type of screening test. People at increased risk should start screening at an earlier age. Screening tests may include: ? Guaiac-based fecal occult blood testing. ? Fecal immunochemical test (FIT). ? Stool DNA test. ? Virtual colonoscopy. ? Sigmoidoscopy. During this test, a flexible tube with a tiny camera (sigmoidoscope) is used to examine your rectum and lower colon. The sigmoidoscope is inserted through your anus into your rectum and lower colon. ? Colonoscopy. During this test, a long, thin, flexible tube with a tiny camera (colonoscope) is used to examine your entire colon and rectum.  Hepatitis C blood test.  Hepatitis B blood test.  Sexually transmitted disease (STD) testing.  Diabetes screening. This is done by checking your blood sugar (glucose) after you have not eaten for a while (fasting). You may have this done every 1-3 years.  Bone density scan. This is done to screen for osteoporosis. You may have this done starting at age 53.  Mammogram. This may be done every 1-2 years. Talk to your health care provider about how often you should have regular mammograms. Talk with your health care provider about your test results, treatment options, and if necessary, the need for more tests. Vaccines Your health care provider may recommend certain vaccines, such as:  Influenza vaccine. This is recommended every year.  Tetanus, diphtheria, and acellular pertussis (Tdap, Td) vaccine. You may need a Td booster every 10 years.  Varicella vaccine. You may need  this if you have not been vaccinated.  Zoster vaccine. You may need this after age 46.  Measles, mumps, and rubella (MMR) vaccine. You may need at least one dose of MMR if you were born in 1957 or later. You may also need a second dose.  Pneumococcal 13-valent  conjugate (PCV13) vaccine. One dose is recommended after age 75.  Pneumococcal polysaccharide (PPSV23) vaccine. One dose is recommended after age 20.  Meningococcal vaccine. You may need this if you have certain conditions.  Hepatitis A vaccine. You may need this if you have certain conditions or if you travel or work in places where you may be exposed to hepatitis A.  Hepatitis B vaccine. You may need this if you have certain conditions or if you travel or work in places where you may be exposed to hepatitis B.  Haemophilus influenzae type b (Hib) vaccine. You may need this if you have certain conditions. Talk to your health care provider about which screenings and vaccines you need and how often you need them. This information is not intended to replace advice given to you by your health care provider. Make sure you discuss any questions you have with your health care provider. Document Released: 08/17/2015 Document Revised: 09/10/2017 Document Reviewed: 05/22/2015 Elsevier Interactive Patient Education  2019 [employer].

## 2018-09-08 ENCOUNTER — Telehealth: Payer: Self-pay | Admitting: Internal Medicine

## 2018-09-08 NOTE — Telephone Encounter (Signed)
Patient returning call for lab results. NOD currently unavailable. Patient would like a call back before 11am, before her next meeting, if possible. Please advise.    Copied from CRM [id number]. Topic: Quick Communication - Lab Results (Clinic Use ONLY) >> Sep 07, 2018  1:18 PM Trenton Gammon, CMA wrote: Called patient to inform them of  lab results. When patient returns call, triage nurse may disclose results.

## 2018-09-08 NOTE — Telephone Encounter (Signed)
TC to patient. Reviewed Dr. Ardyth Harps' note with her including to stop multi vitamin for now. Stated she understood. No further questions.

## 2018-09-08 NOTE — Telephone Encounter (Deleted)
Copied from CRM [id number]. Topic: Quick Communication - Lab Results (Clinic Use ONLY) >> Sep 08, 2018 11:56 AM Trenton Gammon, CMA wrote: Called patient to inform them of  lab results. When patient returns call, triage nurse may disclose results.

## 2018-09-08 NOTE — Telephone Encounter (Signed)
Pt returning call for lab results  

## 2018-09-24 ENCOUNTER — Other Ambulatory Visit: Payer: Self-pay | Admitting: Internal Medicine

## 2018-09-28 ENCOUNTER — Ambulatory Visit: Payer: BLUE CROSS/BLUE SHIELD | Admitting: Sports Medicine

## 2018-09-28 DIAGNOSIS — M1711 Unilateral primary osteoarthritis, right knee: Secondary | ICD-10-CM

## 2018-09-28 NOTE — Patient Instructions (Addendum)
[facility] [address]. [place] [phone]  Appt: Tuesday 10/12/2018 @ 10:15 am. Please arrive at 9:45 am for paperwork.

## 2018-09-28 NOTE — Progress Notes (Signed)
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 Past a medial compartment KR.  Not long after the replacement - 10 to 12 weeks - the knee became more painful again.  Since that time the knee has remained painful.  She has not been able to return to things she wants to do on a daily basis.  She has seen Dr Maureen Ralphs again.  XR and Bone Scan both suggest that the partial knee has separated from the bony fusion.  This seems to be aseptic with no signs of infection and a normal ESR and CRP.  Her questions center around an opinion about best options.  Exam Older F in NAD BP 112/72   Ht 5' 4"  (1.626 m)   Wt 165 lb (74.8 kg)   BMI 28.32 kg/m   Exam of RT knee Midline scar No abnormal warmth or redness No obvious swelling On varus or valgus testing she experiences pain Good flexion and extension Motion may be slightly increased but not unstable  I discussed with her that the knee motion is good and the stability is OK.  However with her persistent pain and the findings of separation of the prostheses from the bone she likely needs a revision.  To me the most likely choice would be a TKR.  She has an opinion from Dr Maureen Ralphs to revise this and would like another opinion. I plan to send her to Dr Mayer Camel for his thoughts.  Ila Mcgill, MD

## 2018-09-28 NOTE — Assessment & Plan Note (Signed)
With current status suggesting that the prostheses and the bone do not have adequate fusion she likely needs revision.  Will see Dr Turner Daniels. Has seen Dr. Despina Hick.  Can decide which approach she would prefer.

## 2018-09-29 NOTE — Addendum Note (Signed)
Addended by: Rutha Bouchard E on: 09/29/2018 09:40 AM   Modules accepted: Orders

## 2018-10-12 ENCOUNTER — Other Ambulatory Visit (INDEPENDENT_AMBULATORY_CARE_PROVIDER_SITE_OTHER): Payer: BLUE CROSS/BLUE SHIELD

## 2018-10-13 ENCOUNTER — Other Ambulatory Visit: Payer: Self-pay | Admitting: Internal Medicine

## 2018-10-13 LAB — COMPREHENSIVE METABOLIC PANEL
ALT: 24 U/L (ref 0–35)
AST: 21 U/L (ref 0–37)
Albumin: 4.4 g/dL (ref 3.5–5.2)
Alkaline Phosphatase: 74 U/L (ref 39–117)
BILIRUBIN TOTAL: 0.4 mg/dL (ref 0.2–1.2)
BUN: 18 mg/dL (ref 6–23)
CO2: 25 mEq/L (ref 19–32)
Calcium: 11 mg/dL — ABNORMAL HIGH (ref 8.4–10.5)
Chloride: 105 mEq/L (ref 96–112)
Creatinine, Ser: 0.79 mg/dL (ref 0.40–1.20)
GFR: 71.43 mL/min (ref >60.00)
Glucose, Bld: 98 mg/dL (ref 70–99)
Potassium: 4.4 mEq/L (ref 3.5–5.1)
Sodium: 140 mEq/L (ref 135–145)
Total Protein: 6.5 g/dL (ref 6.0–8.3)

## 2018-10-15 ENCOUNTER — Other Ambulatory Visit (INDEPENDENT_AMBULATORY_CARE_PROVIDER_SITE_OTHER): Payer: BLUE CROSS/BLUE SHIELD

## 2018-10-15 ENCOUNTER — Other Ambulatory Visit: Payer: Self-pay

## 2018-10-15 LAB — HM MAMMOGRAPHY

## 2018-10-18 ENCOUNTER — Other Ambulatory Visit: Payer: Self-pay | Admitting: Internal Medicine

## 2018-10-18 LAB — EXTRA LAV TOP TUBE

## 2018-10-18 LAB — PTH, INTACT AND CALCIUM
CALCIUM: 10.1 mg/dL (ref 8.6–10.4)
PTH: 52 pg/mL (ref 14–64)

## 2018-10-19 ENCOUNTER — Telehealth: Payer: Self-pay | Admitting: Internal Medicine

## 2018-10-19 NOTE — Telephone Encounter (Signed)
Patient is returning a call for her lab results.  NOD was busy with other calls.  Please call patient back with results.  CB# [phone]

## 2018-10-19 NOTE — Telephone Encounter (Signed)
Pt returned call  Copied from CRM [id number]. Topic: Quick Communication - Lab Results (Clinic Use ONLY) >> Oct 19, 2018  8:40 AM Trenton Gammon, CMA wrote: Called patient to inform them of  lab results. When patient returns call, triage nurse may disclose results.

## 2018-10-19 NOTE — Telephone Encounter (Signed)
Opened by mistake.

## 2018-10-19 NOTE — Telephone Encounter (Signed)
I returned pt's call.  Left message on voicemail to call back for results.

## 2018-10-22 ENCOUNTER — Encounter: Payer: Self-pay | Admitting: Internal Medicine

## 2018-10-26 ENCOUNTER — Ambulatory Visit: Payer: BLUE CROSS/BLUE SHIELD | Admitting: Internal Medicine

## 2018-10-26 ENCOUNTER — Other Ambulatory Visit: Payer: Self-pay

## 2018-10-26 ENCOUNTER — Encounter: Payer: Self-pay | Admitting: Internal Medicine

## 2018-10-26 LAB — BASIC METABOLIC PANEL
BUN: 16 mg/dL (ref 6–23)
CHLORIDE: 108 meq/L (ref 96–112)
CO2: 26 meq/L (ref 19–32)
Calcium: 10.3 mg/dL (ref 8.4–10.5)
Creatinine, Ser: 0.73 mg/dL (ref 0.40–1.20)
GFR: 78.24 mL/min (ref >60.00)
Glucose, Bld: 244 mg/dL — ABNORMAL HIGH (ref 70–99)
Potassium: 4.3 mEq/L (ref 3.5–5.1)
Sodium: 139 mEq/L (ref 135–145)

## 2018-10-26 LAB — VITAMIN D 25 HYDROXY (VIT D DEFICIENCY, FRACTURES): VITD: 34.87 ng/mL (ref 30.00–100.00)

## 2018-10-26 LAB — ALBUMIN: ALBUMIN: 4.1 g/dL (ref 3.5–5.2)

## 2018-10-26 NOTE — Patient Instructions (Signed)
-   Please stay hydrated  - AVOID Calcium supplements and AVOID low calcium diet - Maintain 2-3 servings of calcium a day    24-Hour Urine Collection   You will be collecting your urine for a 24-hour period of time.  Your timer starts with your first urine of the morning (For example - If you first pee at 6AM, your timer will start at 6AM)  Throw away your first urine of the morning  Collect your urine every time you pee for the next 24 hours STOP your urine collection 24 hours after you started the collection (For example - You would stop at 6AM the day after you started)

## 2018-10-26 NOTE — Progress Notes (Signed)
Name: Kathy Herman  MRN/ DOB: [id number], 1946/01/26    Age/ Sex: 73 y.o., female    PCP: Philip Aspen, Limmie Patricia, MD   Reason for Endocrinology Evaluation: Hypercalcemia     Date of Initial Endocrinology Evaluation: 10/26/2018     HPI: Kathy Herman is a 73 y.o. female with a past medical history of T2DM, Dyslipidemia and DJD. The patient presented for initial endocrinology clinic visit on 10/26/2018 for consultative assistance with her Hypercalcemia.   Kathy Herman indicates that she was first diagnosed with hypercalcemia in 08/2018, at which time she presented for routine work up. Since that time, she has not experienced symptoms of constipation, polyuria, but has nighttime polydipsia,denies  generalized weakness, diffuse muscle pains, significant memory impairment. She stopped the use of over the counter calcium (including supplements, Tums, Rolaids, or other calcium containing antacids) in January, 2020, prior to that she was taking MVI and two tablets of calcium daily.  She denies lithium, HCTZ, or vitamin D supplements.   She denies history of kidney stones, kidney disease, liver disease, granulomatous disease. She denies osteoporosis or prior fractures. Daily dietary calcium intake: 2 servings . She denies family history of osteoporosis, parathyroid disease, thyroid disease. Father with kidney stones.     HISTORY:  Past Medical History:  Past Medical History:  Diagnosis Date  . Allergic rhinitis, cause unspecified 05/31/2007  . CERVICAL RADICULOPATHY, RIGHT 01/07/2010  . GERD 07/13/2007   Past Surgical History:  Past Surgical History:  Procedure Laterality Date  . BLEPHAROPLASTY  2016  . MEDIAL PARTIAL KNEE REPLACEMENT Right 2018  . TUBAL LIGATION        Social History:  reports that she has never smoked. She has never used smokeless tobacco.  Family History: family history includes Congestive Heart Failure in her father; Kidney Stones in her father; Memory  loss in her mother.   HOME MEDICATIONS: Allergies as of 10/26/2018      Reactions   Erythromycin    REACTION: nausea      Medication List       Accurate as of October 26, 2018  8:35 AM. Always use your most recent med list.        aspirin EC 81 MG tablet Take 1 tablet (81 mg total) by mouth daily.   esomeprazole 40 MG capsule Commonly known as:  NEXIUM Take 40 mg by mouth daily at 12 noon.   FINASTERIDE PO Take by mouth.   metFORMIN 500 MG 24 hr tablet Commonly known as:  GLUCOPHAGE-XR Take 2 tablets (1,000 mg total) by mouth daily with breakfast.   simvastatin 40 MG tablet Commonly known as:  ZOCOR Take 1 tablet (40 mg total) by mouth at bedtime.   valACYclovir 1000 MG tablet Commonly known as:  VALTREX         REVIEW OF SYSTEMS: A comprehensive ROS was conducted with the patient and is negative except as per HPI and below:  Review of Systems  Genitourinary: Negative for frequency.  Endo/Heme/Allergies: Positive for polydipsia.       OBJECTIVE:  VS: BP 116/62 (BP Location: Left Arm, Patient Position: Sitting, Cuff Size: Normal)   Pulse 74   Temp 97.6 F (36.4 C)   Ht 5\' 4"  (1.626 m)   Wt 175 lb 3.2 oz (79.5 kg)   SpO2 97%   BMI 30.07 kg/m    Wt Readings from Last 3 Encounters:  10/26/18 175 lb 3.2 oz (79.5 kg)  09/28/18 165 lb (74.8  kg)  09/02/18 176 lb 3.2 oz (79.9 kg)     EXAM: General: Pt appears well and is in NAD  Hydration: Well-hydrated with moist mucous membranes and good skin turgor  Eyes: External eye exam normal without stare, lid lag or exophthalmos.  EOM intact.  Ears, Nose, Throat: Hearing: Grossly intact bilaterally Dental: Good dentition  Throat: Clear without mass, erythema or exudate  Neck: General: Supple without adenopathy. Thyroid: Thyroid size normal.  No goiter or nodules appreciated. No thyroid bruit.  Lungs: Clear with good BS bilat with no rales, rhonchi, or wheezes  Heart: Auscultation: RRR.  Abdomen: Normoactive  bowel sounds, soft, nontender, without masses or organomegaly palpable  Extremities:  BL LE: No pretibial edema normal ROM and strength.  Skin: Hair: Texture and amount normal with gender appropriate distribution Skin Inspection: No rashes Skin Palpation: Skin temperature, texture, and thickness normal to palpation  Neuro: Cranial nerves: II - XII grossly intact  Motor: Normal strength throughout DTRs: 2+ and symmetric in UE without delay in relaxation phase  Mental Status: Judgment, insight: Intact Orientation: Oriented to time, place, and person Mood and affect: No depression, anxiety, or agitation     DATA REVIEWED:   Results for Kathy Herman, Kathy Herman (MRN [id number]) as of 10/27/2018 13:46  Ref. Range 10/26/2018 09:04  Sodium Latest Ref Range: 135 - 145 mEq/L 139  Potassium Latest Ref Range: 3.5 - 5.1 mEq/L 4.3  Chloride Latest Ref Range: 96 - 112 mEq/L 108  CO2 Latest Ref Range: 19 - 32 mEq/L 26  Glucose Latest Ref Range: 70 - 99 mg/dL 476 (H)  BUN Latest Ref Range: 6 - 23 mg/dL 16  Creatinine Latest Ref Range: 0.40 - 1.20 mg/dL 5.46  Calcium Latest Ref Range: 8.4 - 10.5 mg/dL 50.3  Albumin Latest Ref Range: 3.5 - 5.2 g/dL 4.1  GFR Latest Ref Range: >60.00 mL/min 78.24  VITD Latest Ref Range: 30.00 - 100.00 ng/mL 34.87   Results for Kathy Herman, Kathy Herman (MRN [id number]) as of 10/27/2018 13:46  Ref. Range 10/26/2018 09:04  PTH, Intact Latest Ref Range: 14 - 64 pg/mL 19   ASSESSMENT/PLAN/RECOMMENDATIONS:   1. Hypercalcemia:  - Pt is asymptomatic  - Her PTH was normal on 10/15/2018 with concomitant normal Calcium. - During her visit we discussed D/D of familial hypocalciuric hypocalcemia vs primary hyperparathyroidism - Repeat serum calcium today is normal at 10.4 mg/dL (corrected 51.70 mg /dL) with normal PTH of 19 pg/mL . It is difficult to determine the cause of hypercalcemia back in January and March, 2020 as it is difficult to determine the cause without concomitant PTh levels at  the time. Her serum levels at the time were  10.8 mg/dL (corrected calcium 01.74 mg/dL ) on 9/44/96  and 75.9 mg/dL (corrected calcium 16.38 mg/dL) on 4/66/59.  - I suspect the patient was on too much supplemental calcium at the time  (~ 1500-1700  mg daily)  in addition to continuing with normal dietary calcium.  - I have advised that as long as she consumed 2-3 servings of calcium a day, no need for additional calcium supplementation, the recommended daily intake of calcium is ~ 1200 mg/day to include dietary and supplemental combined.  - We will not proceed with 24-hr urine collection at this time.   Recommendations :  - Maintain proper hydration - Avoid calcium supplements and avoid low calcium diet  - Maintain 2-3 servings of dietary calcium a day   F/u in 3 months    Addendum: discussed results  with pt on 10/26/2000 @ 1400   Signed electronically by: Lyndle Herrlich, MD  [facility] Endocrinology  [facility] [address] [place], [place] [address] Phone: [phone] FAX: [phone]   CC: Philip Aspen, Limmie Patricia, MD [address] [place] [address] Phone: [phone] Fax: [phone]   Return to Endocrinology clinic as below: Future Appointments  Date Time Provider Department Center  03/11/2019  8:30 AM Philip Aspen, Limmie Patricia, MD [id number]

## 2018-10-27 LAB — PTH, INTACT AND CALCIUM
Calcium: 10.4 mg/dL (ref 8.6–10.4)
PTH: 19 pg/mL (ref 14–64)

## 2018-10-28 ENCOUNTER — Encounter: Payer: Self-pay | Admitting: Internal Medicine

## 2018-11-01 ENCOUNTER — Telehealth: Payer: Self-pay | Admitting: *Deleted

## 2018-11-01 NOTE — Telephone Encounter (Signed)
RN called to f/u on patient after hours call in regards to her sore throat. Patient reports its not really her throat that hurts its her glands on the right side that hurt with a headache at times. Patient reports she has been drinking hot Tea and gargling with some relief. Patient given an OV appointment for 11/02/2018 at 10:30AM. Dr. Ardyth Harps please determine if you would like to do WebEx or in person OV and let Fleet Contras know. I have verified patient email address if WebEx needs to be set up. COVID-19 prescreen complete.   Questions for Screening COVID-19  Symptom onset: 2 days ago   Travel or Contacts: No/unusre of anyone who has COVID-19  During this illness, did/does the patient experience any of the following symptoms? Fever >100.29F []   Yes [x]   No []   Unknown Subjective fever (felt feverish) []   Yes [x]   No []   Unknown Chills []   Yes [x]   No []   Unknown Muscle aches (myalgia) []   Yes [x]   No []   Unknown Runny nose (rhinorrhea) []   Yes [x]   No []   Unknown Sore throat []   Yes [x]   No []   Unknown Cough (new onset or worsening of chronic cough) []   Yes [x]   No []   Unknown Shortness of breath (dyspnea) []   Yes [x]   No []   Unknown Nausea or vomiting []   Yes [x]   No []   Unknown Headache [x]   Yes []   No []   Unknown Only when patient shakes her head  Abdominal pain  []   Yes [x]   No []   Unknown Diarrhea (?3 loose/looser than normal stools/24hr period) []   Yes [x]   No []   Unknown Other, specify:  Patient risk factors: Smoker? []   Current []   Former [x]   Never If female, currently pregnant? []   Yes [x]   No  Patient Active Problem List   Diagnosis Date Noted  . Hyperlipidemia associated with type 2 diabetes mellitus (HCC) 09/02/2018  . Hypercalcemia 09/02/2018  . Osteoarthritis of right knee 01/19/2018  . Controlled type 2 diabetes mellitus without complication, without long-term current use of insulin (HCC) 11/10/2017  . Degeneration of meniscus of right knee 08/10/2014  . Hip pain,  bilateral 01/14/2013  . CERVICAL RADICULOPATHY, RIGHT 01/07/2010  . GERD 07/13/2007  . Allergic rhinitis, cause unspecified 05/31/2007    Plan:  []   High risk for COVID-19 with red flags go to ED (with CP, SOB, weak/lightheaded, or fever > 101.5). Call ahead.  []   High risk for COVID-19 but stable will have car visit. Inform provider and coordinate time. Will be completed in afternoon. [x]   No red flags but URI signs or symptoms will go through side door and be seen in dedicated room.  Note: Referral to telemedicine is an appropriate alternative disposition for higher risk but stable. [facility] Tele[facility]/e-Visit: [phone].

## 2018-11-02 ENCOUNTER — Ambulatory Visit (INDEPENDENT_AMBULATORY_CARE_PROVIDER_SITE_OTHER): Payer: BLUE CROSS/BLUE SHIELD | Admitting: Internal Medicine

## 2018-11-02 ENCOUNTER — Other Ambulatory Visit: Payer: Self-pay

## 2018-11-02 DIAGNOSIS — R590 Localized enlarged lymph nodes: Secondary | ICD-10-CM | POA: Diagnosis not present

## 2018-11-02 NOTE — Telephone Encounter (Signed)
WebEx scheduled

## 2018-11-02 NOTE — Progress Notes (Signed)
Virtual Visit via Video Note  I connected with Kathy Herman on 11/02/18 at 10:30 AM EDT by a video enabled telemedicine application and verified that I am speaking with the correct person using two identifiers.  Location patient: home Location provider: work office Persons participating in the virtual visit: patient, provider  I discussed the limitations of evaluation and management by telemedicine and the availability of in person appointments. The patient expressed understanding and agreed to proceed.   HPI: Since Friday night (5 days ago), she has noticed a lump with "soreness" in her right neck. She does not have any URI-type symptoms (no sore throat, fever, rhinorrhea, itchy eyes, cough, SOB). Hurts when she opens her jaw really wide.  Concerned about COVID-19 and also about her parathyroid.    ROS: Constitutional: Denies fever, chills, diaphoresis, appetite change and fatigue.  HEENT: Denies photophobia, eye pain, redness, hearing loss, ear pain, congestion, sore throat, rhinorrhea, sneezing, mouth sores, trouble swallowing, neck pain, neck stiffness and tinnitus.   Respiratory: Denies SOB, DOE, cough, chest tightness,  and wheezing.   Cardiovascular: Denies chest pain, palpitations and leg swelling.  Gastrointestinal: Denies nausea, vomiting, abdominal pain, diarrhea, constipation, blood in stool and abdominal distention.  Genitourinary: Denies dysuria, urgency, frequency, hematuria, flank pain and difficulty urinating.  Endocrine: Denies: hot or cold intolerance, sweats, changes in hair or nails, polyuria, polydipsia. Musculoskeletal: Denies myalgias, back pain, joint swelling, arthralgias and gait problem.  Skin: Denies pallor, rash and wound.  Neurological: Denies dizziness, seizures, syncope, weakness, light-headedness, numbness and headaches.  Hematological: Denies adenopathy. Easy bruising, personal or family bleeding history  Psychiatric/Behavioral: Denies suicidal  ideation, mood changes, confusion, nervousness, sleep disturbance and agitation   Past Medical History:  Diagnosis Date  . Allergic rhinitis, cause unspecified 05/31/2007  . CERVICAL RADICULOPATHY, RIGHT 01/07/2010  . GERD 07/13/2007    Past Surgical History:  Procedure Laterality Date  . BLEPHAROPLASTY  2016  . MEDIAL PARTIAL KNEE REPLACEMENT Right 2018  . TUBAL LIGATION      Family History  Problem Relation Age of Onset  . Memory loss Mother   . Kidney Stones Father   . Congestive Heart Failure Father     SOCIAL HX:   reports that she has never smoked. She has never used smokeless tobacco. No history on file for alcohol and drug.   Current Outpatient Medications:  .  aspirin EC 81 MG tablet, Take 1 tablet (81 mg total) by mouth daily., Disp: , Rfl:  .  esomeprazole (NEXIUM) 40 MG capsule, Take 40 mg by mouth daily at 12 noon. , Disp: , Rfl:  .  FINASTERIDE PO, Take by mouth., Disp: , Rfl:  .  metFORMIN (GLUCOPHAGE-XR) 500 MG 24 hr tablet, Take 2 tablets (1,000 mg total) by mouth daily with breakfast., Disp: 180 tablet, Rfl: 3 .  simvastatin (ZOCOR) 40 MG tablet, Take 1 tablet (40 mg total) by mouth at bedtime., Disp: 90 tablet, Rfl: 3 .  valACYclovir (VALTREX) 1000 MG tablet, , Disp: , Rfl: 0  EXAM:   VITALS per patient if applicable: None reported  GENERAL: alert, oriented, appears well and in no acute distress  HEENT: atraumatic, conjunttiva clear, no obvious abnormalities on inspection of external nose and ears  NECK: normal movements of the head and neck , she points to right anterior cervical lymph node chain area, as to where the lump is.  LUNGS: on inspection no signs of respiratory distress, breathing rate appears normal, no obvious gross increased work  of breathing, gasping or wheezing  CV: no obvious cyanosis  MS: moves all visible extremities without noticeable abnormality  PSYCH/NEURO: pleasant and cooperative, no obvious depression or anxiety, speech  and thought processing grossly intact  ASSESSMENT AND PLAN:   LAD (lymphadenopathy) of right cervical region -Likely benign LAD, maybe early URI. -No need for concern at present. -Advised her to notify us if still present in 3 weeks.     I discussed the assessment and treatment plan with the patient. The patient was provided an opportunity to ask questions and all were answered. The patient agreed with the plan and demonstrated an understanding of the instructions.   The patient was advised to call back or seek an in-person evaluation if the symptoms worsen or if the condition fails to improve as anticipated.    [name], MD  [facility]

## 2018-11-02 NOTE — Telephone Encounter (Signed)
Webex please

## 2018-12-14 NOTE — H&P (Signed)
TOTAL KNEE REVISION ADMISSION H&P  Patient is being admitted for right knee unicompartmental revision to total knee arthroplasty  Subjective:  Chief Complaint:right knee pain  HPI: Kathy Herman, 73 y.o. female, has a history of pain and functional disability in the right knee(s) due to arthritis and patient has failed non-surgical conservative treatments for greater than 12 weeks to include corticosteriod injections, use of assistive devices and activity modification. The indications for the revision of the total knee arthroplasty are loosening of one or more components. Onset of symptoms was gradual starting 2 years ago with gradually worsening course since that time.  Prior procedures on the right knee(s) include unicompartmental arthroplasty.  Patient currently rates pain in the right knee(s) at 6 out of 10 with activity. There is worsening of pain with activity and weight bearing, pain that interferes with activities of daily living and joint swelling.  Patient has evidence of prosthetic loosening by imaging studies. This condition presents safety issues increasing the risk of falls. There is no current active infection.  Patient Active Problem List   Diagnosis Date Noted  . Hyperlipidemia associated with type 2 diabetes mellitus (HCC) 09/02/2018  . Hypercalcemia 09/02/2018  . Osteoarthritis of right knee 01/19/2018  . Controlled type 2 diabetes mellitus without complication, without long-term current use of insulin (HCC) 11/10/2017  . Degeneration of meniscus of right knee 08/10/2014  . Hip pain, bilateral 01/14/2013  . CERVICAL RADICULOPATHY, RIGHT 01/07/2010  . GERD 07/13/2007  . Allergic rhinitis, cause unspecified 05/31/2007   Past Medical History:  Diagnosis Date  . Allergic rhinitis, cause unspecified 05/31/2007  . CERVICAL RADICULOPATHY, RIGHT 01/07/2010  . GERD 07/13/2007    Past Surgical History:  Procedure Laterality Date  . BLEPHAROPLASTY  2016  . MEDIAL PARTIAL KNEE  REPLACEMENT Right 2018  . TUBAL LIGATION      No current facility-administered medications for this encounter.    Current Outpatient Medications  Medication Sig Dispense Refill Last Dose  . aspirin EC 81 MG tablet Take 1 tablet (81 mg total) by mouth daily.   Taking  . esomeprazole (NEXIUM) 40 MG capsule Take 40 mg by mouth every other day.    Taking  . FINASTERIDE PO Take 5 mg by mouth daily.    Taking  . metFORMIN (GLUCOPHAGE-XR) 500 MG 24 hr tablet Take 2 tablets (1,000 mg total) by mouth daily with breakfast. 180 tablet 3 Taking  . simvastatin (ZOCOR) 40 MG tablet Take 1 tablet (40 mg total) by mouth at bedtime. 90 tablet 3 Taking   Allergies  Allergen Reactions  . Erythromycin Nausea Only    Social History   Tobacco Use  . Smoking status: Never Smoker  . Smokeless tobacco: Never Used  Substance Use Topics  . Alcohol use: Not on file    Family History  Problem Relation Age of Onset  . Memory loss Mother   . Kidney Stones Father   . Congestive Heart Failure Father       Review of Systems  Constitutional: Negative for chills and fever.  HENT: Negative for congestion, sore throat and tinnitus.   Eyes: Negative for double vision, photophobia and pain.  Respiratory: Negative for cough, shortness of breath and wheezing.   Cardiovascular: Negative for chest pain, palpitations and orthopnea.  Gastrointestinal: Negative for heartburn, nausea and vomiting.  Genitourinary: Negative for dysuria, frequency and urgency.  Musculoskeletal: Positive for joint pain.  Neurological: Negative for dizziness, weakness and headaches.     Objective:  Physical Exam  Well nourished and well developed. General: Alert and oriented x3, cooperative and pleasant, no acute distress. Head: normocephalic, atraumatic, neck supple. Eyes: EOMI. Respiratory: breath sounds clear in all fields, no wheezing, rales, or rhonchi. Cardiovascular: Regular rate and rhythm, no murmurs, gallops or rubs.   Abdomen: non-tender to palpation and soft, normoactive bowel sounds.  Musculoskeletal: Right knee exam: Well healed arthroplasty scar.  No swelling. No crepitus on ROM.  Significant tenderness around the PES bursa and medial joint line.  Range of motion: 0 - 130 degrees. Knee is stable.  Calves soft and nontender. Motor function intact in LE. Strength 5/5 LE bilaterally. Neuro: Distal pulses 2+. Sensation to light touch intact in LE.  Labs:  Estimated body mass index is 30.07 kg/m as calculated from the following:   Height as of 10/26/18: 5\' 4"  (1.626 m).   Weight as of 10/26/18: 79.5 kg.  Imaging Review The bone scan dated 08/30/18 shows increased uptake in the area of the proximal medial tibia, consistent with loosening of the tibial component.    Assessment/Plan:  Failed right knee unicompartmental replacement  The patient history, physical examination, clinical judgment of the provider and imaging studies are consistent with end stage degenerative joint disease of the right knee(s), previous total knee arthroplasty. Revision total knee arthroplasty is deemed medically necessary. The treatment options including medical management, injection therapy, arthroscopy and revision arthroplasty were discussed at length. The risks and benefits of revision total knee arthroplasty were presented and reviewed. The risks due to aseptic loosening, infection, stiffness, patella tracking problems, thromboembolic complications and other imponderables were discussed. The patient acknowledged the explanation, agreed to proceed with the plan and consent was signed. Patient is being admitted for inpatient treatment for surgery, pain control, PT, OT, prophylactic antibiotics, VTE prophylaxis, progressive ambulation and ADL's and discharge planning.The patient is planning to be discharged home.   Therapy Plans: outpatient therapy at [facility] Disposition: Home with husband Planned DVT Prophylaxis:  aspirin 325mg  BID DME needed: ice machine PCP: Dr. Ardyth HarpsHernandez TXA: IV Allergies: erythromycin - nausea Anesthesia Concerns: none BMI: 28.3 Last HgbA1c: 6.2%  - Patient was instructed on what medications to stop prior to surgery. - Follow-up visit in 2 weeks with Dr. Lequita HaltAluisio - Begin physical therapy following surgery - Pre-operative lab work as pre-surgical testing - Prescriptions will be provided in hospital at time of discharge  Arther AbbottKristie Hanley Rispoli, PA-C Orthopedic Surgery [facility] [place]

## 2018-12-15 ENCOUNTER — Encounter (HOSPITAL_COMMUNITY): Admission: RE | Payer: Self-pay | Source: Home / Self Care

## 2018-12-15 ENCOUNTER — Inpatient Hospital Stay (HOSPITAL_COMMUNITY)
Admission: RE | Admit: 2018-12-15 | Payer: BLUE CROSS/BLUE SHIELD | Source: Home / Self Care | Admitting: Orthopedic Surgery

## 2018-12-15 SURGERY — CONVERSION, ARTHROPLASTY, KNEE, PARTIAL, TO TOTAL KNEE ARTHROPLASTY
Anesthesia: Choice | Laterality: Right

## 2018-12-16 NOTE — Progress Notes (Signed)
Clearance Kathy Herman on chart

## 2018-12-16 NOTE — Progress Notes (Signed)
SPOKE W/  _ PATIENT    DENIES THE FOLLOWING BELOW    SCREENING SYMPTOMS OF COVID 19:   COUGH--  RUNNY NOSE---   SORE THROAT---  NASAL CONGESTION----  SNEEZING----  SHORTNESS OF BREATH---  DIFFICULTY BREATHING---  TEMP >100.0 -----  UNEXPLAINED BODY ACHES------  CHILLS --------   HEADACHES ---------  LOSS OF SMELL/ TASTE --------    HAVE YOU OR ANY FAMILY MEMBER TRAVELLED PAST 14 DAYS OUT OF THE   COUNTY--- STATE---- COUNTRY----  HAVE YOU OR ANY FAMILY MEMBER BEEN EXPOSED TO ANYONE WITH COVID 19?     

## 2018-12-16 NOTE — Patient Instructions (Addendum)
DAVEIGH BATTY  12/16/2018   YOU ARE REQUIRED TO BE TESTED FOR COVID-19 PRIOR TO YOUR SURGERY . YOUR TEST MUST BE COMPLETED ON Friday Dec 17, 2018. TESTING IS LOCATED AT THE [facility] ENTRANCE FROM 9:00AM - 3:50PM. FAILURE TO COMPLETE TESTING MAY RESULT IN CANCELLATION OF YOUR SURGERY.    _________________________________________________________________________    Your procedure is scheduled on: 12-22-2018   Report to [facility] Main  Entrance     Report to admitting at 6:00AM     Call this number if you have problems the morning of surgery [phone]      Remember: Do not eat food After Midnight. YOU MAY HAVE CLEAR LIQUIDS FROM MIDNIGHT UNTIL 4:30AM. At 4:30AM Please finish the prescribed Pre-Surgery Gatorade drink. Nothing by mouth after you finish the Gatorade drink !  BRUSH YOUR TEETH MORNING OF SURGERY AND RINSE YOUR MOUTH OUT, NO CHEWING GUM CANDY OR MINTS.      CLEAR LIQUID DIET   Foods Allowed                                                                     Foods Excluded  Coffee and tea, regular and decaf                             liquids that you cannot  Plain Jell-O in any flavor                                             see through such as: Fruit ices (not with fruit pulp)                                     milk, soups, orange juice  Iced Popsicles                                    All solid food Carbonated beverages, regular and diet                                    Cranberry, grape and apple juices Sports drinks like Gatorade Lightly seasoned clear broth or consume(fat free) Sugar, honey syrup  Sample Menu Breakfast                                Lunch                                     Supper Cranberry juice                    Beef broth  Chicken broth Jell-O                                     Grape juice                           Apple juice Coffee or tea                         Jell-O                                      Popsicle                                                Coffee or tea                        Coffee or tea  _____________________________________________________________________    Take these medicines the morning of surgery with A SIP OF WATER: esomeprazole, finasteride    DO NOT TAKE ANY DIABETES MEDICATION THE DAY OF SURGERY! PLEASE CHECK YOUR BLOOD SUGAR THE DAY OF SURGERY. REPORT TO YOUR NURSE ON ARRIVAL.  IF YOUR BLOOD SUGAR IS LESS THAN 70 THE MORNING SURGERY WHEN YOU CHECK IT AT HOME, YOU NEED TO CALL THE [phone] FOR FURTHER INSTRUCTIONS!                                You may not have any metal on your body including hair pins and              piercings  Do not wear jewelry, make-up, lotions, powders or perfumes, deodorant             Do not wear nail polish.  Do not shave  48 hours prior to surgery.                 Do not bring valuables to the hospital. [place] IS NOT             RESPONSIBLE   FOR VALUABLES.  Contacts, dentures or bridgework may not be worn into surgery.                Please read over the following fact sheets you were given: _____________________________________________________________________             [facility] - Preparing for Surgery Before surgery, you can play an important role.  Because skin is not sterile, your skin needs to be as free of germs as possible.  You can reduce the number of germs on your skin by washing with CHG (chlorahexidine gluconate) soap before surgery.  CHG is an antiseptic cleaner which kills germs and bonds with the skin to continue killing germs even after washing. Please DO NOT use if you have an allergy to CHG or antibacterial soaps.  If your skin becomes reddened/irritated stop using the CHG and inform your nurse when you arrive at Short Stay. Do not shave (including legs and underarms) for at least 48 hours prior to the first CHG shower.  You may shave your face/neck. Please follow these instructions carefully:  1.  Shower with CHG Soap the night before surgery and the  morning of Surgery.  2.  If you choose to wash your hair, wash your hair first as usual with your  normal  shampoo.  3.  After you shampoo, rinse your hair and body thoroughly to remove the  shampoo.                           4.  Use CHG as you would any other liquid soap.  You can apply chg directly  to the skin and wash                       Gently with a scrungie or clean washcloth.  5.  Apply the CHG Soap to your body ONLY FROM THE NECK DOWN.   Do not use on face/ open                           Wound or open sores. Avoid contact with eyes, ears mouth and genitals (private parts).                       Wash face,  Genitals (private parts) with your normal soap.             6.  Wash thoroughly, paying special attention to the area where your surgery  will be performed.  7.  Thoroughly rinse your body with warm water from the neck down.  8.  DO NOT shower/wash with your normal soap after using and rinsing off  the CHG Soap.                9.  Pat yourself dry with a clean towel.            10.  Wear clean pajamas.            11.  Place clean sheets on your bed the night of your first shower and do not  sleep with pets. Day of Surgery : Do not apply any lotions/deodorants the morning of surgery.  Please wear clean clothes to the hospital/surgery center.  FAILURE TO FOLLOW THESE INSTRUCTIONS MAY RESULT IN THE CANCELLATION OF YOUR SURGERY PATIENT SIGNATURE_________________________________  NURSE SIGNATURE__________________________________  ________________________________________________________________________   Kathy Herman  An incentive spirometer is a tool that can help keep your lungs clear and active. This tool measures how well you are filling your lungs with each breath. Taking long deep breaths may help reverse or decrease the chance of  developing breathing (pulmonary) problems (especially infection) following:  A long period of time when you are unable to move or be active. BEFORE THE PROCEDURE   If the spirometer includes an indicator to show your best effort, your nurse or respiratory therapist will set it to a desired goal.  If possible, sit up straight or lean slightly forward. Try not to slouch.  Hold the incentive spirometer in an upright position. INSTRUCTIONS FOR USE  1. Sit on the edge of your bed if possible, or sit up as far as you can in bed or on a chair. 2. Hold the incentive spirometer in an upright position. 3. Breathe out normally. 4. Place the mouthpiece in your mouth and seal your lips tightly around it. 5. Breathe in slowly and as deeply as possible, raising  the piston or the ball toward the top of the column. 6. Hold your breath for 3-5 seconds or for as long as possible. Allow the piston or ball to fall to the bottom of the column. 7. Remove the mouthpiece from your mouth and breathe out normally. 8. Rest for a few seconds and repeat Steps 1 through 7 at least 10 times every 1-2 hours when you are awake. Take your time and take a few normal breaths between deep breaths. 9. The spirometer may include an indicator to show your best effort. Use the indicator as a goal to work toward during each repetition. 10. After each set of 10 deep breaths, practice coughing to be sure your lungs are clear. If you have an incision (the cut made at the time of surgery), support your incision when coughing by placing a pillow or rolled up towels firmly against it. Once you are able to get out of bed, walk around indoors and cough well. You may stop using the incentive spirometer when instructed by your caregiver.  RISKS AND COMPLICATIONS  Take your time so you do not get dizzy or light-headed.  If you are in pain, you may need to take or ask for pain medication before doing incentive spirometry. It is harder to take a  deep breath if you are having pain. AFTER USE  Rest and breathe slowly and easily.  It can be helpful to keep track of a log of your progress. Your caregiver can provide you with a simple table to help with this. If you are using the spirometer at home, follow these instructions: SEEK MEDICAL CARE IF:   You are having difficultly using the spirometer.  You have trouble using the spirometer as often as instructed.  Your pain medication is not giving enough relief while using the spirometer.  You develop fever of 100.5 F (38.1 C) or higher. SEEK IMMEDIATE MEDICAL CARE IF:   You cough up bloody sputum that had not been present before.  You develop fever of 102 F (38.9 C) or greater.  You develop worsening pain at or near the incision site. MAKE SURE YOU:   Understand these instructions.  Will watch your condition.  Will get help right away if you are not doing well or get worse. Document Released: 12/01/2006 Document Revised: 10/13/2011 Document Reviewed: 02/01/2007 Exit[employer] Patient Information 2014 [employer], [place].   ________________________________________________________________________  WHAT IS A BLOOD TRANSFUSION? Blood Transfusion Information  A transfusion is the replacement of blood or some of its parts. Blood is made up of multiple cells which provide different functions.  Red blood cells carry oxygen and are used for blood loss replacement.  White blood cells fight against infection.  Platelets control bleeding.  Plasma helps clot blood.  Other blood products are available for specialized needs, such as hemophilia or other clotting disorders. BEFORE THE TRANSFUSION  Who gives blood for transfusions?   Healthy volunteers who are fully evaluated to make sure their blood is safe. This is blood bank blood. Transfusion therapy is the safest it has ever been in the practice of medicine. Before blood is taken from a donor, a complete history is taken to make  sure that person has no history of diseases nor engages in risky social behavior (examples are intravenous drug use or sexual activity with multiple partners). The donor's travel history is screened to minimize risk of transmitting infections, such as malaria. The donated blood is tested for signs of infectious diseases, such as HIV  and hepatitis. The blood is then tested to be sure it is compatible with you in order to minimize the chance of a transfusion reaction. If you or a relative donates blood, this is often done in anticipation of surgery and is not appropriate for emergency situations. It takes many days to process the donated blood. RISKS AND COMPLICATIONS Although transfusion therapy is very safe and saves many lives, the main dangers of transfusion include:   Getting an infectious disease.  Developing a transfusion reaction. This is an allergic reaction to something in the blood you were given. Every precaution is taken to prevent this. The decision to have a blood transfusion has been considered carefully by your caregiver before blood is given. Blood is not given unless the benefits outweigh the risks. AFTER THE TRANSFUSION  Right after receiving a blood transfusion, you will usually feel much better and more energetic. This is especially true if your red blood cells have gotten low (anemic). The transfusion raises the level of the red blood cells which carry oxygen, and this usually causes an energy increase.  The nurse administering the transfusion will monitor you carefully for complications. HOME CARE INSTRUCTIONS  No special instructions are needed after a transfusion. You may find your energy is better. Speak with your caregiver about any limitations on activity for underlying diseases you may have. SEEK MEDICAL CARE IF:   Your condition is not improving after your transfusion.  You develop redness or irritation at the intravenous (IV) site. SEEK IMMEDIATE MEDICAL CARE IF:   Any of the following symptoms occur over the next 12 hours:  Shaking chills.  You have a temperature by mouth above 102 F (38.9 C), not controlled by medicine.  Chest, back, or muscle pain.  People around you feel you are not acting correctly or are confused.  Shortness of breath or difficulty breathing.  Dizziness and fainting.  You get a rash or develop hives.  You have a decrease in urine output.  Your urine turns a dark color or changes to pink, red, or brown. Any of the following symptoms occur over the next 10 days:  You have a temperature by mouth above 102 F (38.9 C), not controlled by medicine.  Shortness of breath.  Weakness after normal activity.  The white part of the eye turns yellow (jaundice).  You have a decrease in the amount of urine or are urinating less often.  Your urine turns a dark color or changes to pink, red, or brown. Document Released: 07/18/2000 Document Revised: 10/13/2011 Document Reviewed: 03/06/2008 [facility] Patient Information 2014 [place], [place].  _______________________________________________________________________

## 2018-12-17 ENCOUNTER — Encounter (HOSPITAL_COMMUNITY): Payer: Self-pay

## 2018-12-17 ENCOUNTER — Other Ambulatory Visit: Payer: Self-pay

## 2018-12-17 ENCOUNTER — Encounter (HOSPITAL_COMMUNITY)
Admission: RE | Admit: 2018-12-17 | Discharge: 2018-12-17 | Disposition: A | Discharge status: Home / Self Care | Payer: BLUE CROSS/BLUE SHIELD | Source: Ambulatory Visit | Attending: Orthopedic Surgery | Admitting: Orthopedic Surgery

## 2018-12-17 ENCOUNTER — Other Ambulatory Visit (HOSPITAL_COMMUNITY)
Admission: RE | Admit: 2018-12-17 | Discharge: 2018-12-17 | Disposition: A | Discharge status: Home / Self Care | Payer: BLUE CROSS/BLUE SHIELD | Source: Ambulatory Visit | Attending: Orthopedic Surgery | Admitting: Orthopedic Surgery

## 2018-12-17 DIAGNOSIS — E119 Type 2 diabetes mellitus without complications: Secondary | ICD-10-CM | POA: Diagnosis not present

## 2018-12-17 DIAGNOSIS — Z01818 Encounter for other preprocedural examination: Secondary | ICD-10-CM | POA: Diagnosis present

## 2018-12-17 DIAGNOSIS — Z1159 Encounter for screening for other viral diseases: Secondary | ICD-10-CM | POA: Diagnosis not present

## 2018-12-17 DIAGNOSIS — T84093A Other mechanical complication of internal left knee prosthesis, initial encounter: Secondary | ICD-10-CM | POA: Insufficient documentation

## 2018-12-17 HISTORY — DX: Hyperlipidemia, unspecified: E78.5

## 2018-12-17 HISTORY — DX: Type 2 diabetes mellitus without complications: E11.9

## 2018-12-17 HISTORY — DX: Hypercalcemia: E83.52

## 2018-12-17 LAB — COMPREHENSIVE METABOLIC PANEL
ALT: 27 U/L (ref 0–44)
AST: 23 U/L (ref 15–41)
Albumin: 3.9 g/dL (ref 3.5–5.0)
Alkaline Phosphatase: 74 U/L (ref 38–126)
Anion gap: 6 (ref 5–15)
BUN: 14 mg/dL (ref 8–23)
CO2: 24 mmol/L (ref 22–32)
Calcium: 9.8 mg/dL (ref 8.9–10.3)
Chloride: 110 mmol/L (ref 98–111)
Creatinine, Ser: 0.73 mg/dL (ref 0.44–1.00)
GFR calc Af Amer: >60 mL/min (ref >60)
GFR calc non Af Amer: >60 mL/min (ref >60)
Glucose, Bld: 235 mg/dL — ABNORMAL HIGH (ref 70–99)
Potassium: 4.2 mmol/L (ref 3.5–5.1)
Sodium: 140 mmol/L (ref 135–145)
Total Bilirubin: 0.6 mg/dL (ref 0.3–1.2)
Total Protein: 6.7 g/dL (ref 6.5–8.1)

## 2018-12-17 LAB — GLUCOSE, CAPILLARY: Glucose-Capillary: 216 mg/dL — ABNORMAL HIGH (ref 70–99)

## 2018-12-17 LAB — CBC
HCT: 40 % (ref 36.0–46.0)
Hemoglobin: 13.3 g/dL (ref 12.0–15.0)
MCH: 30.2 pg (ref 26.0–34.0)
MCHC: 33.3 g/dL (ref 30.0–36.0)
MCV: 90.9 fL (ref 80.0–100.0)
Platelets: 231 10*3/uL (ref 150–400)
RBC: 4.4 MIL/uL (ref 3.87–5.11)
RDW: 12.6 % (ref 11.5–15.5)
WBC: 7.9 10*3/uL (ref 4.0–10.5)
nRBC: 0 % (ref 0.0–0.2)

## 2018-12-17 LAB — PROTIME-INR
INR: 1 (ref 0.8–1.2)
Prothrombin Time: 12.6 seconds (ref 11.4–15.2)

## 2018-12-17 LAB — HEMOGLOBIN A1C
Hgb A1c MFr Bld: 7 % — ABNORMAL HIGH (ref 4.8–5.6)
Mean Plasma Glucose: 154.2 mg/dL

## 2018-12-17 LAB — APTT: aPTT: 31 seconds (ref 24–36)

## 2018-12-17 LAB — SURGICAL PCR SCREEN
MRSA, PCR: NEGATIVE
Staphylococcus aureus: NEGATIVE

## 2018-12-17 LAB — ABO/RH: ABO/RH(D): O POS

## 2018-12-18 LAB — NOVEL CORONAVIRUS, NAA (HOSP ORDER, SEND-OUT TO REF LAB; TAT 18-24 HRS): SARS-CoV-2, NAA: NOT DETECTED

## 2018-12-20 NOTE — Anesthesia Preprocedure Evaluation (Addendum)
Anesthesia Evaluation  Patient identified by MRN, date of birth, ID band Patient awake    Reviewed: Allergy & Precautions, H&P , NPO status , Patient's Chart, lab work & pertinent test results  Airway Mallampati: II  TM Distance: >3 FB Neck ROM: Full    Dental no notable dental hx. (+) Teeth Intact   Pulmonary neg pulmonary ROS, Current Smoker,    Pulmonary exam normal breath sounds clear to auscultation       Cardiovascular Exercise Tolerance: Good negative cardio ROS Normal cardiovascular exam Rhythm:Regular Rate:Normal     Neuro/Psych  Neuromuscular disease negative neurological ROS  negative psych ROS   GI/Hepatic negative GI ROS, Neg liver ROS, GERD  Medicated,  Endo/Other  negative endocrine ROSdiabetes, Type 2, Insulin Dependent  Renal/GU negative Renal ROS  negative genitourinary   Musculoskeletal negative musculoskeletal ROS (+) Arthritis , Osteoarthritis,    Abdominal   Peds negative pediatric ROS (+)  Hematology negative hematology ROS (+)   Anesthesia Other Findings   Reproductive/Obstetrics negative OB ROS                           Anesthesia Physical Anesthesia Plan  ASA: III  Anesthesia Plan: Spinal and MAC   Post-op Pain Management: GA combined w/ Regional for post-op pain   Induction:   PONV Risk Score and Plan: 2 and Ondansetron, Treatment may vary due to age or medical condition and Midazolam  Airway Management Planned: Nasal Cannula and Simple Face Mask  Additional Equipment:   Intra-op Plan:   Post-operative Plan:   Informed Consent: I have reviewed the patients History and Physical, chart, labs and discussed the procedure including the risks, benefits and alternatives for the proposed anesthesia with the patient or authorized representative who has indicated his/her understanding and acceptance.       Plan Discussed with: Anesthesiologist, CRNA and  Surgeon  Anesthesia Plan Comments: (See PAT ntoe 12/17/18, Konrad Felix, PA-C)      Anesthesia Quick Evaluation

## 2018-12-20 NOTE — Progress Notes (Signed)
Anesthesia Chart Review   Case:  [id number] Date/Time:  12/22/18 0815   Procedure:  Revision right knee uni to total knee arthroplasty (Right ) -   Anesthesia type:  Choice   Pre-op diagnosis:  Failed right knee unicompartmental replacement   Location:  [facility] ROOM 08 / [facility]   Surgeon:  Ollen Gross, MD      DISCUSSION:73 yo never smoker with h/o HLD, GERD, DM II, failed right knee unicompartmental replacement scheduled for above procedure 12/22/18 with Dr. Ollen Gross.    Preoperative risk evaluation on chart.  Per Dr. Peggye Pitt 12/07/18, "Cardiac Risk Index 3/9% (30 day risk of death, MI, or cardiac arrest) Class I Risk, acceptable to proceed with surgery.  Stop ASA 5 days prior to surgery."  Glucose 235 at PAT visit 12/17/18.  A1C 7.0.  Pt is on metformin alone.  Will recheck CBG DOS.  VS: BP 126/64 (BP Location: Right Arm)   Pulse 79   Temp 36.9 C (Oral)   Resp 18   Ht 5\' 4"  (1.626 m)   Wt 79 kg   SpO2 99%   BMI 29.91 kg/m   PROVIDERS: Philip Aspen, Limmie Patricia, MD is PCP   Lonzo Cloud, Brent Bulla, MD is Endocrinologist  LABS: Labs reviewed: Acceptable for surgery. (all labs ordered are listed, but only abnormal results are displayed)  Labs Reviewed  HEMOGLOBIN A1C - Abnormal; Notable for the following components:      Result Value   Hgb A1c MFr Bld 7.0 (*)    All other components within normal limits  COMPREHENSIVE METABOLIC PANEL - Abnormal; Notable for the following components:   Glucose, Bld 235 (*)    All other components within normal limits  GLUCOSE, CAPILLARY - Abnormal; Notable for the following components:   Glucose-Capillary 216 (*)    All other components within normal limits  SURGICAL PCR SCREEN  APTT  CBC  PROTIME-INR  TYPE AND SCREEN  ABO/RH     IMAGES:   EKG: 12/17/2018 Rate 69 bpm Sinus rhythm with occasional Premature ventricular complexes   CV:  Past Medical History:  Diagnosis Date  . Allergic rhinitis, cause unspecified  05/31/2007  . CERVICAL RADICULOPATHY, RIGHT 01/07/2010  . Diabetes mellitus without complication (HCC)    dx 2019  . GERD 07/13/2007   reports hx of reflux that was causing some dysphagia, reports now improved    . Hypercalcemia    has since stopped supplemnetal calcium and following pcp for mgt   . Hyperlipidemia     Past Surgical History:  Procedure Laterality Date  . BLEPHAROPLASTY  2016  . MEDIAL PARTIAL KNEE REPLACEMENT Right 2018  . TUBAL LIGATION      MEDICATIONS: . aspirin EC 81 MG tablet  . esomeprazole (NEXIUM) 40 MG capsule  . FINASTERIDE PO  . metFORMIN (GLUCOPHAGE-XR) 500 MG 24 hr tablet  . simvastatin (ZOCOR) 40 MG tablet   No current facility-administered medications for this encounter.     Janey Genta [facility] Pre-Surgical Testing [phone] 12/20/18 11:29 AM

## 2018-12-21 MED ORDER — BUPIVACAINE LIPOSOME 1.3 % IJ SUSP
20.0000 mL | Freq: Once | INTRAMUSCULAR | Status: DC
  Filled 2018-12-21: qty 20

## 2018-12-22 ENCOUNTER — Other Ambulatory Visit: Payer: Self-pay

## 2018-12-22 ENCOUNTER — Inpatient Hospital Stay (HOSPITAL_COMMUNITY): Payer: BLUE CROSS/BLUE SHIELD | Admitting: Anesthesiology

## 2018-12-22 ENCOUNTER — Observation Stay (HOSPITAL_COMMUNITY)
Admission: RE | Admit: 2018-12-22 | Discharge: 2018-12-23 | Disposition: A | Discharge status: Home / Self Care | Payer: BLUE CROSS/BLUE SHIELD | Attending: Orthopedic Surgery | Admitting: Orthopedic Surgery

## 2018-12-22 ENCOUNTER — Inpatient Hospital Stay (HOSPITAL_COMMUNITY): Payer: BLUE CROSS/BLUE SHIELD | Admitting: Physician Assistant

## 2018-12-22 ENCOUNTER — Encounter (HOSPITAL_COMMUNITY)
Admission: RE | Disposition: A | Discharge status: Home / Self Care | Payer: Self-pay | Source: Home / Self Care | Attending: Orthopedic Surgery

## 2018-12-22 ENCOUNTER — Encounter (HOSPITAL_COMMUNITY): Payer: Self-pay | Admitting: *Deleted

## 2018-12-22 DIAGNOSIS — M25561 Pain in right knee: Secondary | ICD-10-CM | POA: Diagnosis present

## 2018-12-22 DIAGNOSIS — Z7982 Long term (current) use of aspirin: Secondary | ICD-10-CM | POA: Insufficient documentation

## 2018-12-22 DIAGNOSIS — Y831 Surgical operation with implant of artificial internal device as the cause of abnormal reaction of the patient, or of later complication, without mention of misadventure at the time of the procedure: Secondary | ICD-10-CM | POA: Insufficient documentation

## 2018-12-22 DIAGNOSIS — T84032A Mechanical loosening of internal right knee prosthetic joint, initial encounter: Secondary | ICD-10-CM | POA: Diagnosis not present

## 2018-12-22 DIAGNOSIS — E119 Type 2 diabetes mellitus without complications: Secondary | ICD-10-CM | POA: Insufficient documentation

## 2018-12-22 DIAGNOSIS — K219 Gastro-esophageal reflux disease without esophagitis: Secondary | ICD-10-CM | POA: Insufficient documentation

## 2018-12-22 DIAGNOSIS — E785 Hyperlipidemia, unspecified: Secondary | ICD-10-CM | POA: Diagnosis not present

## 2018-12-22 DIAGNOSIS — Z79899 Other long term (current) drug therapy: Secondary | ICD-10-CM | POA: Insufficient documentation

## 2018-12-22 DIAGNOSIS — M1711 Unilateral primary osteoarthritis, right knee: Secondary | ICD-10-CM | POA: Insufficient documentation

## 2018-12-22 DIAGNOSIS — M1710 Unilateral primary osteoarthritis, unspecified knee: Secondary | ICD-10-CM | POA: Diagnosis present

## 2018-12-22 DIAGNOSIS — Z794 Long term (current) use of insulin: Secondary | ICD-10-CM | POA: Insufficient documentation

## 2018-12-22 DIAGNOSIS — M1712 Unilateral primary osteoarthritis, left knee: Secondary | ICD-10-CM | POA: Diagnosis present

## 2018-12-22 DIAGNOSIS — M179 Osteoarthritis of knee, unspecified: Secondary | ICD-10-CM | POA: Diagnosis present

## 2018-12-22 HISTORY — PX: CONVERSION TO TOTAL KNEE: SHX5785

## 2018-12-22 LAB — TYPE AND SCREEN
ABO/RH(D): O POS
Antibody Screen: NEGATIVE

## 2018-12-22 LAB — GLUCOSE, CAPILLARY
Glucose-Capillary: 134 mg/dL — ABNORMAL HIGH (ref 70–99)
Glucose-Capillary: 161 mg/dL — ABNORMAL HIGH (ref 70–99)
Glucose-Capillary: 204 mg/dL — ABNORMAL HIGH (ref 70–99)

## 2018-12-22 SURGERY — CONVERSION, ARTHROPLASTY, KNEE, PARTIAL, TO TOTAL KNEE ARTHROPLASTY
Anesthesia: Monitor Anesthesia Care | Site: Knee | Laterality: Right

## 2018-12-22 MED ORDER — METOCLOPRAMIDE HCL 5 MG/ML IJ SOLN: 5.0000 mg | Freq: Three times a day (TID) | INTRAMUSCULAR | Status: DC | PRN

## 2018-12-22 MED ORDER — GABAPENTIN 300 MG PO CAPS
300.0000 mg | ORAL_CAPSULE | Freq: Three times a day (TID) | ORAL | Status: DC
  Administered 2018-12-22 – 2018-12-23 (×3): 300 mg via ORAL
  Filled 2018-12-22 (×3): qty 1

## 2018-12-22 MED ORDER — CLONIDINE HCL (ANALGESIA) 100 MCG/ML EP SOLN
EPIDURAL | Status: DC | PRN
  Administered 2018-12-22: 100 ug

## 2018-12-22 MED ORDER — METHOCARBAMOL 500 MG IVPB - SIMPLE MED
500.0000 mg | Freq: Four times a day (QID) | INTRAVENOUS | Status: DC | PRN
  Filled 2018-12-22: qty 50

## 2018-12-22 MED ORDER — MENTHOL 3 MG MT LOZG: 1.0000 | LOZENGE | OROMUCOSAL | Status: DC | PRN

## 2018-12-22 MED ORDER — ACETAMINOPHEN 325 MG PO TABS: 325.0000 mg | ORAL_TABLET | ORAL | Status: DC | PRN

## 2018-12-22 MED ORDER — PROPOFOL 10 MG/ML IV BOLUS
INTRAVENOUS | Status: AC
  Filled 2018-12-22: qty 80

## 2018-12-22 MED ORDER — DOCUSATE SODIUM 100 MG PO CAPS
100.0000 mg | ORAL_CAPSULE | Freq: Two times a day (BID) | ORAL | Status: DC
  Administered 2018-12-22 – 2018-12-23 (×2): 100 mg via ORAL
  Filled 2018-12-22 (×2): qty 1

## 2018-12-22 MED ORDER — POVIDONE-IODINE 10 % EX SWAB
2.0000 "application " | Freq: Once | CUTANEOUS | Status: AC
  Administered 2018-12-22: 2 via TOPICAL

## 2018-12-22 MED ORDER — ACETAMINOPHEN 10 MG/ML IV SOLN
1000.0000 mg | Freq: Four times a day (QID) | INTRAVENOUS | Status: DC
  Administered 2018-12-22: 09:00:00 1000 mg via INTRAVENOUS
  Filled 2018-12-22: qty 100

## 2018-12-22 MED ORDER — MIDAZOLAM HCL 2 MG/2ML IJ SOLN
INTRAMUSCULAR | Status: AC
  Filled 2018-12-22: qty 2

## 2018-12-22 MED ORDER — TRAMADOL HCL 50 MG PO TABS
50.0000 mg | ORAL_TABLET | Freq: Four times a day (QID) | ORAL | Status: DC | PRN
  Administered 2018-12-22: 100 mg via ORAL
  Filled 2018-12-22 (×2): qty 2

## 2018-12-22 MED ORDER — MIDAZOLAM HCL 2 MG/2ML IJ SOLN
2.0000 mg | Freq: Once | INTRAMUSCULAR | Status: AC
  Administered 2018-12-22: 08:00:00 2 mg via INTRAVENOUS

## 2018-12-22 MED ORDER — MEPERIDINE HCL 50 MG/ML IJ SOLN: 6.2500 mg | INTRAMUSCULAR | Status: DC | PRN

## 2018-12-22 MED ORDER — ONDANSETRON HCL 4 MG/2ML IJ SOLN
INTRAMUSCULAR | Status: AC
  Filled 2018-12-22: qty 2

## 2018-12-22 MED ORDER — DEXAMETHASONE SODIUM PHOSPHATE 10 MG/ML IJ SOLN
INTRAMUSCULAR | Status: DC | PRN
  Administered 2018-12-22: 8 mg via INTRAVENOUS

## 2018-12-22 MED ORDER — METOCLOPRAMIDE HCL 5 MG PO TABS: 5.0000 mg | ORAL_TABLET | Freq: Three times a day (TID) | ORAL | Status: DC | PRN

## 2018-12-22 MED ORDER — DEXAMETHASONE SODIUM PHOSPHATE 10 MG/ML IJ SOLN
INTRAMUSCULAR | Status: AC
  Filled 2018-12-22: qty 1

## 2018-12-22 MED ORDER — INSULIN ASPART 100 UNIT/ML ~~LOC~~ SOLN
0.0000 [IU] | Freq: Three times a day (TID) | SUBCUTANEOUS | Status: DC
  Administered 2018-12-23: 5 [IU] via SUBCUTANEOUS
  Administered 2018-12-23: 3 [IU] via SUBCUTANEOUS

## 2018-12-22 MED ORDER — ONDANSETRON HCL 4 MG PO TABS
4.0000 mg | ORAL_TABLET | Freq: Four times a day (QID) | ORAL | Status: DC | PRN
  Administered 2018-12-22: 4 mg via ORAL
  Filled 2018-12-22: qty 1

## 2018-12-22 MED ORDER — SODIUM CHLORIDE (PF) 0.9 % IJ SOLN
INTRAMUSCULAR | Status: AC
  Filled 2018-12-22: qty 10

## 2018-12-22 MED ORDER — ONDANSETRON HCL 4 MG/2ML IJ SOLN: 4.0000 mg | Freq: Once | INTRAMUSCULAR | Status: DC | PRN

## 2018-12-22 MED ORDER — ONDANSETRON HCL 4 MG/2ML IJ SOLN
INTRAMUSCULAR | Status: DC | PRN
  Administered 2018-12-22: 4 mg via INTRAVENOUS

## 2018-12-22 MED ORDER — CEFAZOLIN SODIUM-DEXTROSE 2-4 GM/100ML-% IV SOLN
2.0000 g | Freq: Four times a day (QID) | INTRAVENOUS | Status: AC
  Administered 2018-12-22 (×2): 2 g via INTRAVENOUS
  Filled 2018-12-22 (×2): qty 100

## 2018-12-22 MED ORDER — FENTANYL CITRATE (PF) 100 MCG/2ML IJ SOLN
INTRAMUSCULAR | Status: AC
  Filled 2018-12-22: qty 2

## 2018-12-22 MED ORDER — ACETAMINOPHEN 500 MG PO TABS
1000.0000 mg | ORAL_TABLET | Freq: Four times a day (QID) | ORAL | Status: AC
  Administered 2018-12-22 – 2018-12-23 (×4): 1000 mg via ORAL
  Filled 2018-12-22 (×4): qty 2

## 2018-12-22 MED ORDER — LACTATED RINGERS IV SOLN
INTRAVENOUS | Status: DC
  Administered 2018-12-22: 07:00:00 via INTRAVENOUS

## 2018-12-22 MED ORDER — DIPHENHYDRAMINE HCL 12.5 MG/5ML PO ELIX: 12.5000 mg | ORAL_SOLUTION | ORAL | Status: DC | PRN

## 2018-12-22 MED ORDER — SIMVASTATIN 40 MG PO TABS
40.0000 mg | ORAL_TABLET | Freq: Every day | ORAL | Status: DC
  Administered 2018-12-22: 40 mg via ORAL
  Filled 2018-12-22: qty 1

## 2018-12-22 MED ORDER — SODIUM CHLORIDE 0.9 % IV SOLN
INTRAVENOUS | Status: DC | PRN
  Administered 2018-12-22: 20 ug/min via INTRAVENOUS

## 2018-12-22 MED ORDER — BUPIVACAINE LIPOSOME 1.3 % IJ SUSP
INTRAMUSCULAR | Status: DC | PRN
  Administered 2018-12-22: 20 mL

## 2018-12-22 MED ORDER — MIDAZOLAM HCL 2 MG/2ML IJ SOLN
INTRAMUSCULAR | Status: DC | PRN
  Administered 2018-12-22: 1 mg via INTRAVENOUS

## 2018-12-22 MED ORDER — DEXAMETHASONE SODIUM PHOSPHATE 10 MG/ML IJ SOLN
10.0000 mg | Freq: Once | INTRAMUSCULAR | Status: AC
  Administered 2018-12-23: 10 mg via INTRAVENOUS
  Filled 2018-12-22: qty 1

## 2018-12-22 MED ORDER — TRANEXAMIC ACID-NACL 1000-0.7 MG/100ML-% IV SOLN
1000.0000 mg | INTRAVENOUS | Status: AC
  Administered 2018-12-22: 1000 mg via INTRAVENOUS
  Filled 2018-12-22: qty 100

## 2018-12-22 MED ORDER — PHENYLEPHRINE 40 MCG/ML (10ML) SYRINGE FOR IV PUSH (FOR BLOOD PRESSURE SUPPORT)
PREFILLED_SYRINGE | INTRAVENOUS | Status: DC | PRN
  Administered 2018-12-22 (×3): 120 ug via INTRAVENOUS

## 2018-12-22 MED ORDER — FLEET ENEMA 7-19 GM/118ML RE ENEM: 1.0000 | ENEMA | Freq: Once | RECTAL | Status: DC | PRN

## 2018-12-22 MED ORDER — METHOCARBAMOL 500 MG PO TABS
500.0000 mg | ORAL_TABLET | Freq: Four times a day (QID) | ORAL | Status: DC | PRN
  Administered 2018-12-22 – 2018-12-23 (×4): 500 mg via ORAL
  Filled 2018-12-22 (×4): qty 1

## 2018-12-22 MED ORDER — SODIUM CHLORIDE (PF) 0.9 % IJ SOLN
INTRAMUSCULAR | Status: DC | PRN
  Administered 2018-12-22: 60 mL

## 2018-12-22 MED ORDER — ASPIRIN EC 325 MG PO TBEC
325.0000 mg | DELAYED_RELEASE_TABLET | Freq: Two times a day (BID) | ORAL | Status: DC
  Administered 2018-12-23: 325 mg via ORAL
  Filled 2018-12-22: qty 1

## 2018-12-22 MED ORDER — SODIUM CHLORIDE 0.9 % IV SOLN
INTRAVENOUS | Status: DC
  Administered 2018-12-22 – 2018-12-23 (×2): via INTRAVENOUS

## 2018-12-22 MED ORDER — SODIUM CHLORIDE 0.9 % IR SOLN
Status: DC | PRN
  Administered 2018-12-22: 1000 mL

## 2018-12-22 MED ORDER — PHENOL 1.4 % MT LIQD: 1.0000 | OROMUCOSAL | Status: DC | PRN

## 2018-12-22 MED ORDER — FENTANYL CITRATE (PF) 250 MCG/5ML IJ SOLN
INTRAMUSCULAR | Status: DC | PRN
  Administered 2018-12-22: 50 ug via INTRAVENOUS

## 2018-12-22 MED ORDER — CHLORHEXIDINE GLUCONATE 4 % EX LIQD: 60.0000 mL | Freq: Once | CUTANEOUS | Status: DC

## 2018-12-22 MED ORDER — FENTANYL CITRATE (PF) 100 MCG/2ML IJ SOLN: 25.0000 ug | INTRAMUSCULAR | Status: DC | PRN

## 2018-12-22 MED ORDER — FINASTERIDE 5 MG PO TABS
5.0000 mg | ORAL_TABLET | Freq: Every day | ORAL | Status: DC
  Administered 2018-12-23: 5 mg via ORAL
  Filled 2018-12-22: qty 1

## 2018-12-22 MED ORDER — MORPHINE SULFATE (PF) 2 MG/ML IV SOLN
1.0000 mg | INTRAVENOUS | Status: DC | PRN
  Administered 2018-12-22: 1 mg via INTRAVENOUS
  Administered 2018-12-22 – 2018-12-23 (×2): 2 mg via INTRAVENOUS
  Filled 2018-12-22 (×3): qty 1

## 2018-12-22 MED ORDER — SODIUM CHLORIDE (PF) 0.9 % IJ SOLN
INTRAMUSCULAR | Status: AC
  Filled 2018-12-22: qty 50

## 2018-12-22 MED ORDER — FENTANYL CITRATE (PF) 100 MCG/2ML IJ SOLN
100.0000 ug | Freq: Once | INTRAMUSCULAR | Status: AC
  Administered 2018-12-22: 100 ug via INTRAVENOUS

## 2018-12-22 MED ORDER — POLYETHYLENE GLYCOL 3350 17 G PO PACK: 17.0000 g | PACK | Freq: Every day | ORAL | Status: DC | PRN

## 2018-12-22 MED ORDER — BISACODYL 10 MG RE SUPP: 10.0000 mg | Freq: Every day | RECTAL | Status: DC | PRN

## 2018-12-22 MED ORDER — ACETAMINOPHEN 160 MG/5ML PO SOLN: 325.0000 mg | ORAL | Status: DC | PRN

## 2018-12-22 MED ORDER — PHENYLEPHRINE 40 MCG/ML (10ML) SYRINGE FOR IV PUSH (FOR BLOOD PRESSURE SUPPORT)
PREFILLED_SYRINGE | INTRAVENOUS | Status: AC
  Filled 2018-12-22: qty 10

## 2018-12-22 MED ORDER — OXYCODONE HCL 5 MG PO TABS: 5.0000 mg | ORAL_TABLET | Freq: Once | ORAL | Status: DC | PRN

## 2018-12-22 MED ORDER — DEXAMETHASONE SODIUM PHOSPHATE 10 MG/ML IJ SOLN: 8.0000 mg | Freq: Once | INTRAMUSCULAR | Status: DC

## 2018-12-22 MED ORDER — BUPIVACAINE HCL (PF) 0.25 % IJ SOLN
INTRAMUSCULAR | Status: AC
  Filled 2018-12-22: qty 30

## 2018-12-22 MED ORDER — PROPOFOL 500 MG/50ML IV EMUL
INTRAVENOUS | Status: DC | PRN
  Administered 2018-12-22: 135 ug/kg/min via INTRAVENOUS

## 2018-12-22 MED ORDER — OXYCODONE HCL 5 MG PO TABS
5.0000 mg | ORAL_TABLET | ORAL | Status: DC | PRN
  Administered 2018-12-22 (×3): 5 mg via ORAL
  Administered 2018-12-23: 10 mg via ORAL
  Administered 2018-12-23: 5 mg via ORAL
  Filled 2018-12-22 (×2): qty 2
  Filled 2018-12-22 (×2): qty 1
  Filled 2018-12-22: qty 2

## 2018-12-22 MED ORDER — PANTOPRAZOLE SODIUM 40 MG PO TBEC
40.0000 mg | DELAYED_RELEASE_TABLET | Freq: Every day | ORAL | Status: DC
  Administered 2018-12-23: 40 mg via ORAL
  Filled 2018-12-22: qty 1

## 2018-12-22 MED ORDER — ONDANSETRON HCL 4 MG/2ML IJ SOLN: 4.0000 mg | Freq: Four times a day (QID) | INTRAMUSCULAR | Status: DC | PRN

## 2018-12-22 MED ORDER — CEFAZOLIN SODIUM-DEXTROSE 2-4 GM/100ML-% IV SOLN
2.0000 g | INTRAVENOUS | Status: AC
  Administered 2018-12-22: 2 g via INTRAVENOUS
  Filled 2018-12-22: qty 100

## 2018-12-22 MED ORDER — PHENYLEPHRINE HCL (PRESSORS) 10 MG/ML IV SOLN
INTRAVENOUS | Status: AC
  Filled 2018-12-22: qty 2

## 2018-12-22 MED ORDER — ROPIVACAINE HCL 7.5 MG/ML IJ SOLN
INTRAMUSCULAR | Status: DC | PRN
  Administered 2018-12-22: 20 mL via PERINEURAL

## 2018-12-22 MED ORDER — OXYCODONE HCL 5 MG/5ML PO SOLN: 5.0000 mg | Freq: Once | ORAL | Status: DC | PRN

## 2018-12-22 SURGICAL SUPPLY — 53 items
ATTUNE PS FEM RT SZ 5 CEM KNEE (Femur) ×2 IMPLANT
BANDAGE ACE 6X5 VEL STRL LF (GAUZE/BANDAGES/DRESSINGS) ×3 IMPLANT
BLADE SAG 18X100X1.27 (BLADE) ×3 IMPLANT
BLADE SAW SGTL 11.0X1.19X90.0M (BLADE) ×3 IMPLANT
BOWL SMART MIX CTS (DISPOSABLE) ×3 IMPLANT
BSPLAT TIB 4 CMNT REV ROT PLAT (Knees) ×1 IMPLANT
CEMENT HV SMART SET (Cement) ×6 IMPLANT
CLOSURE WOUND 1/2 X4 (GAUZE/BANDAGES/DRESSINGS) ×1
COVER SURGICAL LIGHT HANDLE (MISCELLANEOUS) ×3 IMPLANT
CUFF TOURN SGL QUICK 34 (TOURNIQUET CUFF) ×3
CUFF TRNQT CYL 34X4.125X (TOURNIQUET CUFF) ×1 IMPLANT
DRAPE POUCH INSTRU U-SHP 10X18 (DRAPES) ×3 IMPLANT
DRAPE U-SHAPE 47X51 STRL (DRAPES) ×3 IMPLANT
DRSG ADAPTIC 3X8 NADH LF (GAUZE/BANDAGES/DRESSINGS) ×3 IMPLANT
DRSG PAD ABDOMINAL 8X10 ST (GAUZE/BANDAGES/DRESSINGS) ×3 IMPLANT
DURAPREP 26ML APPLICATOR (WOUND CARE) ×3 IMPLANT
ELECT REM PT RETURN 15FT ADLT (MISCELLANEOUS) ×3 IMPLANT
EVACUATOR 1/8 PVC DRAIN (DRAIN) ×3 IMPLANT
GAUZE SPONGE 4X4 12PLY STRL (GAUZE/BANDAGES/DRESSINGS) ×3 IMPLANT
GLOVE BIO SURGEON STRL SZ 6 (GLOVE) ×2 IMPLANT
GLOVE BIO SURGEON STRL SZ8 (GLOVE) ×3 IMPLANT
GLOVE BIOGEL PI IND STRL 6.5 (GLOVE) IMPLANT
GLOVE BIOGEL PI IND STRL 8 (GLOVE) ×3 IMPLANT
GLOVE BIOGEL PI INDICATOR 6.5 (GLOVE) ×4
GLOVE BIOGEL PI INDICATOR 8 (GLOVE) ×2
GOWN STRL REUS W/TWL LRG LVL3 (GOWN DISPOSABLE) ×7 IMPLANT
HANDPIECE INTERPULSE COAX TIP (DISPOSABLE) ×3
IMMOBILIZER KNEE 20 (SOFTGOODS) ×3
IMMOBILIZER KNEE 20 THIGH 36 (SOFTGOODS) ×1 IMPLANT
INSERT TIB CMT ATTUNE RP SZ4 (Knees) ×2 IMPLANT
INSERT TIBIAL KNEE SZ 5 12MM (Insert) IMPLANT
KIT TURNOVER KIT A (KITS) IMPLANT
MANIFOLD NEPTUNE II (INSTRUMENTS) ×3 IMPLANT
PACK TOTAL KNEE CUSTOM (KITS) ×2 IMPLANT
PADDING CAST COTTON 6X4 STRL (CAST SUPPLIES) ×8 IMPLANT
PATELLA MEDIAL ATTUN 35MM KNEE (Knees) ×2 IMPLANT
PIN STEINMAN FIXATION KNEE (PIN) ×2 IMPLANT
PIN THREADED HEADED SIGMA (PIN) ×2 IMPLANT
PROTECTOR NERVE ULNAR (MISCELLANEOUS) ×3 IMPLANT
REV SLEEVE ATTUNE 29 KNEE (Orthopedic Implant) ×2 IMPLANT
SET HNDPC FAN SPRY TIP SCT (DISPOSABLE) ×1 IMPLANT
STRIP CLOSURE SKIN 1/2X4 (GAUZE/BANDAGES/DRESSINGS) ×3 IMPLANT
SUT MNCRL AB 4-0 PS2 18 (SUTURE) ×3 IMPLANT
SUT PDS AB 1 CT1 27 (SUTURE) ×3 IMPLANT
SUT STRATAFIX 0 PDS 27 VIOLET (SUTURE) ×3
SUT VIC AB 2-0 CT1 27 (SUTURE) ×9
SUT VIC AB 2-0 CT1 TAPERPNT 27 (SUTURE) ×3 IMPLANT
SUTURE STRATFX 0 PDS 27 VIOLET (SUTURE) ×1 IMPLANT
TIBIAL INSERT KNEE SZ 5 12MM (Insert) ×3 IMPLANT
TRAY FOLEY CATH 14FRSI W/METER (CATHETERS) ×2 IMPLANT
WATER STERILE IRR 1000ML POUR (IV SOLUTION) ×5 IMPLANT
WRAP KNEE MAXI GEL POST OP (GAUZE/BANDAGES/DRESSINGS) ×4 IMPLANT
YANKAUER SUCT BULB TIP 10FT TU (MISCELLANEOUS) ×2 IMPLANT

## 2018-12-22 NOTE — Interval H&P Note (Signed)
History and Physical Interval Note:  12/22/2018 8:18 AM  Kathy Herman  has presented today for surgery, with the diagnosis of Failed right knee unicompartmental replacement.  The various methods of treatment have been discussed with the patient and family. After consideration of risks, benefits and other options for treatment, the patient has consented to  Procedure(s) with comments: Revision right knee uni to total knee arthroplasty (Right) - as a surgical intervention.  The patient's history has been reviewed, patient examined, no change in status, stable for surgery.  I have reviewed the patient's chart and labs.  Questions were answered to the patient's satisfaction.     Homero Fellers Flordia Kassem

## 2018-12-22 NOTE — Brief Op Note (Signed)
12/22/2018  9:51 AM  PATIENT:  Kathy Herman  73 y.o. female  PRE-OPERATIVE DIAGNOSIS:  Failed right knee unicompartmental replacement  POST-OPERATIVE DIAGNOSIS:  Failed right knee unicompartmental replacement  PROCEDURE:  Procedure(s) with comments: Revision right knee uni to total knee arthroplasty (Right) -  SURGEON:  Surgeon(s) and Role:    Ollen Gross, MD - Primary  PHYSICIAN ASSISTANT:   ASSISTANTS: Dennie Bible, PA-C   ANESTHESIA:   Adductor canal block and spinal  EBL:  50 mL   BLOOD ADMINISTERED:none  DRAINS: (Medium) Hemovact drain(s) in the right knee with  Suction Open   LOCAL MEDICATIONS USED:  OTHER [employer]  COUNTS:  YES  TOURNIQUET:   Total Tourniquet Time Documented: Thigh (Right) - 53 minutes Total: Thigh (Right) - 53 minutes   DICTATION: .Other Dictation: Dictation Number [id number]  PLAN OF CARE: Admit to inpatient   PATIENT DISPOSITION:  PACU - hemodynamically stable.

## 2018-12-22 NOTE — Progress Notes (Signed)
Assisted Dr. Tacy Dura with right, ultrasound guided, adductor canal block. Side rails up x1, monitors on throughout procedure. See vital signs in flow sheet. Tolerated Procedure well.

## 2018-12-22 NOTE — Transfer of Care (Signed)
Immediate Anesthesia Transfer of Care Note  Patient: Kathy Herman  Procedure(s) Performed: Revision right knee uni to total knee arthroplasty (Right Knee)  Patient Location: PACU  Anesthesia Type:Spinal  Level of Consciousness: awake, alert  and oriented  Airway & Oxygen Therapy: Patient Spontanous Breathing and Patient connected to face mask oxygen  Post-op Assessment: Report given to RN and Post -op Vital signs reviewed and stable  Post vital signs: Reviewed and stable  Last Vitals:  Vitals Value Taken Time  BP    Temp    Pulse    Resp    SpO2      Last Pain:  Vitals:   12/22/18 0700  TempSrc:   PainSc: 0-No pain      Patients Stated Pain Goal: 1 (12/22/18 0700)  Complications: No apparent anesthesia complications

## 2018-12-22 NOTE — Discharge Instructions (Signed)
° °Dr. Frank Aluisio °Total Joint Specialist °[facility] °[address] °[place], [place] [address] °[phone] ° °TOTAL KNEE REPLACEMENT POSTOPERATIVE DIRECTIONS ° °Knee Rehabilitation, Guidelines Following Surgery  °Results after knee surgery are often greatly improved when you follow the exercise, range of motion and muscle strengthening exercises prescribed by your doctor. Safety measures are also important to protect the knee from further injury. Any time any of these exercises cause you to have increased pain or swelling in your knee joint, decrease the amount until you are comfortable again and slowly increase them. If you have problems or questions, call your caregiver or physical therapist for advice.  ° °HOME CARE INSTRUCTIONS  °• Remove items at home which could result in a fall. This includes throw rugs or furniture in walking pathways.  °· ICE to the affected knee every three hours for 30 minutes at a time and then as needed for pain and swelling.  Continue to use ice on the knee for pain and swelling from surgery. You may notice swelling that will progress down to the foot and ankle.  This is normal after surgery.  Elevate the leg when you are not up walking on it.   °· Continue to use the breathing machine which will help keep your temperature down.  It is common for your temperature to cycle up and down following surgery, especially at night when you are not up moving around and exerting yourself.  The breathing machine keeps your lungs expanded and your temperature down. °· Do not place pillow under knee, focus on keeping the knee straight while resting ° °DIET °You may resume your previous home diet once your are discharged from the hospital. ° °DRESSING / WOUND CARE / SHOWERING °You may change your dressing 3-5 days after surgery.  Then change the dressing every day with sterile gauze.  Please use good hand washing techniques before changing the dressing.  Do not use any lotions  or creams on the incision until instructed by your surgeon. °You may start showering once you are discharged home but do not submerge the incision under water. Just pat the incision dry and apply a dry gauze dressing on daily. °Change the surgical dressing daily and reapply a dry dressing each time. ° °ACTIVITY °Walk with your walker as instructed. °Use walker as long as suggested by your caregivers. °Avoid periods of inactivity such as sitting longer than an hour when not asleep. This helps prevent blood clots.  °You may resume a sexual relationship in one month or when given the OK by your doctor.  °You may return to work once you are cleared by your doctor.  °Do not drive a car for 6 weeks or until released by you surgeon.  °Do not drive while taking narcotics. ° °WEIGHT BEARING °Weight bearing as tolerated with assist device (walker, cane, etc) as directed, use it as long as suggested by your surgeon or therapist, typically at least 4-6 weeks. ° °POSTOPERATIVE CONSTIPATION PROTOCOL °Constipation - defined medically as fewer than three stools per week and severe constipation as less than one stool per week. ° °One of the most common issues patients have following surgery is constipation.  Even if you have a regular bowel pattern at home, your normal regimen is likely to be disrupted due to multiple reasons following surgery.  Combination of anesthesia, postoperative narcotics, change in appetite and fluid intake all can affect your bowels.  In order to avoid complications following surgery, here are some   recommendations in order to help you during your recovery period. ° °Colace (docusate) - Pick up an over-the-counter form of Colace or another stool softener and take twice a day as long as you are requiring postoperative pain medications.  Take with a full glass of water daily.  If you experience loose stools or diarrhea, hold the colace until you stool forms back up.  If your symptoms do not get better within 1  week or if they get worse, check with your doctor. ° °Dulcolax (bisacodyl) - Pick up over-the-counter and take as directed by the product packaging as needed to assist with the movement of your bowels.  Take with a full glass of water.  Use this product as needed if not relieved by Colace only.  ° °MiraLax (polyethylene glycol) - Pick up over-the-counter to have on hand.  MiraLax is a solution that will increase the amount of water in your bowels to assist with bowel movements.  Take as directed and can mix with a glass of water, juice, soda, coffee, or tea.  Take if you go more than two days without a movement. °Do not use MiraLax more than once per day. Call your doctor if you are still constipated or irregular after using this medication for 7 days in a row. ° °If you continue to have problems with postoperative constipation, please contact the office for further assistance and recommendations.  If you experience "the worst abdominal pain ever" or develop nausea or vomiting, please contact the office immediatly for further recommendations for treatment. ° °ITCHING °If you experience itching with your medications, try taking only a single pain pill, or even half a pain pill at a time.  You can also use Benadryl over the counter for itching or also to help with sleep.  ° °TED HOSE STOCKINGS °Wear the elastic stockings on both legs for three weeks following surgery during the day but you may remove then at night for sleeping. ° °MEDICATIONS °See your medication summary on the “After Visit Summary” that the nursing staff will review with you prior to discharge.  You may have some home medications which will be placed on hold until you complete the course of blood thinner medication.  It is important for you to complete the blood thinner medication as prescribed by your surgeon.  Continue your approved medications as instructed at time of discharge. ° °PRECAUTIONS °If you experience chest pain or shortness of breath -  call 911 immediately for transfer to the hospital emergency department.  °If you develop a fever greater that 101 F, purulent drainage from wound, increased redness or drainage from wound, foul odor from the wound/dressing, or calf pain - CONTACT YOUR SURGEON.   °                                                °FOLLOW-UP APPOINTMENTS °Make sure you keep all of your appointments after your operation with your surgeon and caregivers. You should call the office at the above phone number and make an appointment for approximately two weeks after the date of your surgery or on the date instructed by your surgeon outlined in the "After Visit Summary". ° °RANGE OF MOTION AND STRENGTHENING EXERCISES  °Rehabilitation of the knee is important following a knee injury or an operation. After just a few days of immobilization, the muscles of   the thigh which control the knee become weakened and shrink (atrophy). Knee exercises are designed to build up the tone and strength of the thigh muscles and to improve knee motion. Often times heat used for twenty to thirty minutes before working out will loosen up your tissues and help with improving the range of motion but do not use heat for the first two weeks following surgery. These exercises can be done on a training (exercise) mat, on the floor, on a table or on a bed. Use what ever works the best and is most comfortable for you Knee exercises include:  °• Leg Lifts - While your knee is still immobilized in a splint or cast, you can do straight leg raises. Lift the leg to 60 degrees, hold for 3 sec, and slowly lower the leg. Repeat 10-20 times 2-3 times daily. Perform this exercise against resistance later as your knee gets better.  °• Quad and Hamstring Sets - Tighten up the muscle on the front of the thigh (Quad) and hold for 5-10 sec. Repeat this 10-20 times hourly. Hamstring sets are done by pushing the foot backward against an object and holding for 5-10 sec. Repeat as with quad  sets.  °· Leg Slides: Lying on your back, slowly slide your foot toward your buttocks, bending your knee up off the floor (only go as far as is comfortable). Then slowly slide your foot back down until your leg is flat on the floor again. °· Angel Wings: Lying on your back spread your legs to the side as far apart as you can without causing discomfort.  °A rehabilitation program following serious knee injuries can speed recovery and prevent re-injury in the future due to weakened muscles. Contact your doctor or a physical therapist for more information on knee rehabilitation.  ° °IF YOU ARE TRANSFERRED TO A SKILLED REHAB FACILITY °If the patient is transferred to a skilled rehab facility following release from the hospital, a list of the current medications will be sent to the facility for the patient to continue.  When discharged from the skilled rehab facility, please have the facility set up the patient's Home Health Physical Therapy prior to being released. Also, the skilled facility will be responsible for providing the patient with their medications at time of release from the facility to include their pain medication, the muscle relaxants, and their blood thinner medication. If the patient is still at the rehab facility at time of the two week follow up appointment, the skilled rehab facility will also need to assist the patient in arranging follow up appointment in our office and any transportation needs. ° °MAKE SURE YOU:  °• Understand these instructions.  °• Get help right away if you are not doing well or get worse.  ° ° °Pick up stool softner and laxative for home use following surgery while on pain medications. °Do not submerge incision under water. °Please use good hand washing techniques while changing dressing each day. °May shower starting three days after surgery. °Please use a clean towel to pat the incision dry following showers. °Continue to use ice for pain and swelling after surgery. °Do not  use any lotions or creams on the incision until instructed by your surgeon. ° °

## 2018-12-22 NOTE — Anesthesia Postprocedure Evaluation (Signed)
Anesthesia Post Note  Patient: Kathy Herman  Procedure(s) Performed: Revision right knee uni to total knee arthroplasty (Right Knee)     Anesthesia Post Evaluation  Last Vitals:  Vitals:   12/22/18 1313 12/22/18 1412  BP: 110/62 113/63  Pulse: 63 67  Resp: 16 20  Temp:  36.5 C  SpO2: 100% 94%    Last Pain:  Vitals:   12/22/18 1430  TempSrc:   PainSc: 4                  Avyaan Summer

## 2018-12-22 NOTE — Evaluation (Signed)
Physical Therapy Evaluation Patient Details Name: Kathy Herman MRN: [id number] DOB: November 04, 1945 Today's Date: 12/22/2018   History of Present Illness  73 yo female s/p R TKA 5/20. Hx of R uni knee 2018  Clinical Impression  On eval POD 0, pt required Min assist for mobility. She walked ~50 feet with a RW. Pain rated 6/10 with activity. She did c/o some lightheadedness and nausea once back in room. BP 97/58. D/C plan (per pt) is for home with OP PT f/u.    Follow Up Recommendations Follow surgeon's recommendation for DC plan and follow-up therapies    Equipment Recommendations  None recommended by PT    Recommendations for Other Services       Precautions / Restrictions Precautions Precautions: Fall;Knee Required Braces or Orthoses: Knee Immobilizer - Right Knee Immobilizer - Right: Discontinue once straight leg raise with < 10 degree lag Restrictions Weight Bearing Restrictions: No Other Position/Activity Restrictions: WBAT      Mobility  Bed Mobility Overal bed mobility: Needs Assistance Bed Mobility: Supine to Sit     Supine to sit: Min guard;HOB elevated     General bed mobility comments: close guard for safety. Cues for safety  Transfers Overall transfer level: Needs assistance Equipment used: Rolling walker (2 wheeled) Transfers: Sit to/from Stand Sit to Stand: Min assist         General transfer comment: VCS safety, technique, hand placement. Assist to rise, stabilize, control descent. Pt attempted to sit before safely positioned in front of chair  Ambulation/Gait Ambulation/Gait assistance: Min assist Gait Distance (Feet): 50 Feet Assistive device: Rolling walker (2 wheeled) Gait Pattern/deviations: Step-to pattern;Antalgic     General Gait Details: VCs safety, technique, sequence. Assist to stabilize pt intermittently. Slow gait speed. Pain increased as distance increased. Pt rated pain 6/10  Stairs            Wheelchair Mobility     Modified Rankin (Stroke Patients Only)       Balance Overall balance assessment: Needs assistance         Standing balance support: Bilateral upper extremity supported Standing balance-Leahy Scale: Poor                               Pertinent Vitals/Pain Pain Assessment: 0-10 Pain Score: 6  Pain Location: R knee Pain Descriptors / Indicators: Aching;Sore Pain Intervention(s): Monitored during session;Repositioned;Ice applied    Home Living Family/patient expects to be discharged to:: Private residence Living Arrangements: Spouse/significant other   Type of Home: House Home Access: Stairs to enter Entrance Stairs-Rails: None Entrance Stairs-Number of Steps: 4 Home Layout: Able to live on main level with bedroom/bathroom Home Equipment: Walker - 2 wheels;Cane - single point;Other (comment)(incline pillow)      Prior Function Level of Independence: Independent               Hand Dominance        Extremity/Trunk Assessment   Upper Extremity Assessment Upper Extremity Assessment: Overall WFL for tasks assessed    Lower Extremity Assessment Lower Extremity Assessment: Generalized weakness(post op weakness)    Cervical / Trunk Assessment Cervical / Trunk Assessment: Normal  Communication   Communication: No difficulties  Cognition Arousal/Alertness: Awake/alert Behavior During Therapy: WFL for tasks assessed/performed Overall Cognitive Status: Within Functional Limits for tasks assessed  General Comments      Exercises     Assessment/Plan    PT Assessment Patient needs continued PT services  PT Problem List Decreased strength;Decreased balance;Decreased mobility;Decreased knowledge of precautions;Decreased activity tolerance;Decreased range of motion;Pain;Decreased knowledge of use of DME       PT Treatment Interventions Gait training;DME instruction;Functional mobility  training;Balance training;Patient/family education;Therapeutic activities;Therapeutic exercise;Stair training    PT Goals (Current goals can be found in the Care Plan section)  Acute Rehab PT Goals Patient Stated Goal: less pain. regain independence and PLOF. PT Goal Formulation: With patient Time For Goal Achievement: 01/05/19 Potential to Achieve Goals: Good    Frequency 7X/week   Barriers to discharge        Co-evaluation               AM-PAC PT "6 Clicks" Mobility  Outcome Measure Help needed turning from your back to your side while in a flat bed without using bedrails?: A Little Help needed moving from lying on your back to sitting on the side of a flat bed without using bedrails?: A Little Help needed moving to and from a bed to a chair (including a wheelchair)?: A Little Help needed standing up from a chair using your arms (e.g., wheelchair or bedside chair)?: A Little Help needed to walk in hospital room?: A Little Help needed climbing 3-5 steps with a railing? : A Little 6 Click Score: 18    End of Session Equipment Utilized During Treatment: Gait belt;Right knee immobilizer Activity Tolerance: Patient tolerated treatment well Patient left: in chair;with call bell/phone within reach   PT Visit Diagnosis: Unsteadiness on feet (R26.81);Other abnormalities of gait and mobility (R26.89);Pain Pain - Right/Left: Right Pain - part of body: Knee    Time: 1641-1701 PT Time Calculation (min) (ACUTE ONLY): 20 min   Charges:   PT Evaluation $PT Eval Low Complexity: 1 Low            Rebeca AlertJannie Bria Portales, PT Acute Rehabilitation Services Pager: [phone] Office: [phone]

## 2018-12-22 NOTE — Anesthesia Procedure Notes (Signed)
Procedure Name: MAC Date/Time: 12/22/2018 8:21 AM Performed by: Niel Hummer, CRNA Pre-anesthesia Checklist: Patient identified, Emergency Drugs available, Suction available and Patient being monitored Patient Re-evaluated:Patient Re-evaluated prior to induction Oxygen Delivery Method: Simple face mask

## 2018-12-22 NOTE — Anesthesia Procedure Notes (Signed)
Anesthesia Regional Block: Adductor canal block   Pre-Anesthetic Checklist: ,, timeout performed, Correct Patient, Correct Site, Correct Laterality, Correct Procedure, Correct Position, site marked, Risks and benefits discussed,  Surgical consent,  Pre-op evaluation,  At surgeon's request and post-op pain management  Laterality: Right  Prep: chloraprep       Needles:  Injection technique: Single-shot  Needle Type: Echogenic Stimulator Needle     Needle Length: 5cm  Needle Gauge: 22     Additional Needles:   Procedures:, nerve stimulator,,, ultrasound used (permanent image in chart),,,,  Narrative:  Start time: 12/22/2018 7:36 AM End time: 12/22/2018 7:40 AM Injection made incrementally with aspirations every 5 mL.  Performed by: Personally  Anesthesiologist: Bethena Midget, MD  Additional Notes: Functioning IV was confirmed and monitors were applied.  A 18mm 22ga Arrow echogenic stimulator needle was used. Sterile prep and drape,hand hygiene and sterile gloves were used. Ultrasound guidance: relevant anatomy identified, needle position confirmed, local anesthetic spread visualized around nerve(s)., vascular puncture avoided.  Image printed for medical record. Negative aspiration and negative test dose prior to incremental administration of local anesthetic. The patient tolerated the procedure well.

## 2018-12-22 NOTE — Op Note (Signed)
NAME: Kathy Herman, Kathy S. MEDICAL RECORD [id number] ACCOUNT [id number] DATE OF BIRTH:01-02-1946 FACILITY: [facility] LOCATION: [id number] PHYSICIAN:Jaimere Feutz Dulcy FannyV. Toyoko Silos, MD  OPERATIVE REPORT  DATE OF PROCEDURE:  12/22/2018  PREOPERATIVE DIAGNOSIS:  Failed right knee unicompartmental replacement.  POSTOPERATIVE DIAGNOSIS:  Failed right knee unicompartmental replacement.  PROCEDURE:  Revision of right knee unicompartmental total knee arthroplasty.  SURGEON:  Ollen GrossFrank Josephina Melcher, MD  ASSISTANT:  Dennie BibleAshley Stinson PA-C  ANESTHESIA:  Spinal and adductor canal block.  ESTIMATED BLOOD LOSS:  Minimal.  DRAINS:  Hemovac x1.  TOURNIQUET TIME:  53 minutes at 300 mmHg.  COMPLICATIONS:  None.  CONDITION:  Stable to recovery.  BRIEF CLINICAL NOTE:  The patient is a 73 year old female who had a right knee unicompartmental replacement done approximately 2 years ago.  She has had persistent pain.  Nonoperative management did not address her pain, and it was felt that she  potentially had progression of arthritic change or possible loosening of the medial unicompartmental replacement.  Infection workup was negative.  She presents now for revision from a uni to a total knee arthroplasty.  PROCEDURE IN DETAIL:  After successful administration of adductor canal block and spinal anesthetic with a tourniquet placed high on her right thigh and right lower extremity, prepped and draped in the usual sterile fashion.  Extremity was wrapped in  Esmarch and tourniquet inflated to 300 mmHg.  A midline incision was made with a 10 blade through subcutaneous tissue to the level of the extensor mechanism.  A fresh blade was used to make a medial parapatellar arthrotomy.  Soft tissue on the proximal  medial tibia was subperiosteally elevated to the joint line with a knife and into the semimembranosus bursa with a Cobb elevator.  Soft tissue laterally was elevated with attention being paid to avoid the patellar tendon on the tibial  tubercle.  The  patella was everted, knee flexed 90 degrees, and ACL and PCL removed.  I then used an osteotome to remove the femoral component of the unicompartmental replacement from the medial side.  It was not grossly loose, but it was removed relatively easily,  consistent with probable loosening of the femoral component.  There was no associated bone loss.  A drill was then used to create a starting hole in the distal femur.  The canal was thoroughly irrigated.  A 5-degree right valgus alignment guide was  placed.  Referencing off the posterior condyles, rotation was marked and the block pinned to remove 9 mm off the distal femur.  Distal femoral resection was made with an oscillating saw.  The tibia subluxed forward, the lateral meniscus removed, and the extramedullary tibial alignment guide was placed referencing proximally to medial aspect of the tibial tubercle and distally along the second metatarsal axis and tibial crest.  I placed  the blocks at the resection level just below the medial tibial implant.  Resection was made with an oscillating saw.  I tested the tibial side prior to this, and the tibial component was not loose.  Size 4 was the most appropriate tibial implant.  We  prepared the proximal tibia with the modular drill and keel punch for size 4 for the Attune revision tray.  I then prepared for a 29 mm sleeve for rotational control.  The femur was then prepared.  A sizing guide was placed.  Since the posterior medial condyle had previously been resected, we did not rotate off the condyles.  We rotated off the epicondylar axis to mark a rotation of the prosthesis.  Size 5 was the size  of the femur, and rotation was marked at the epicondylar axis.  A size 5 cutting block was placed, and anterior/posterior chamfer cuts were made.  Intercondylar block was placed and the intercondylar cut made.  The size 5 posterior stabilized femoral  trial was placed.  A 10 mm posterior stabilized  rotating platform insert trial was placed, and there was a tiny bit of laxity with a 10, so we went up to a 12, which allowed for full extension with excellent varus/valgus and anterior/posterior balance  throughout full range of motion.  Patella was everted and thickness measured to be 21 mm.  Freehand resection was taken to 12 mm, 35 template was placed, lug holes were drilled, trial patella was placed, and tracks normally.  The tibial trial was  removed, and osteophytes were removed off the posterior femur.  The remainder of the trials was removed, and the cut bone surfaces were prepared with pulsatile lavage.  Cement was mixed, and once ready for implantation on the tibial side, we cemented in  the size 4 Attune revision tray with a 29 mm sleeve, size 5 posterior stabilized femur, and a 35 patella.  The trial 12 mm posterior stabilized rotating platform insert trial was placed and full extension was achieved with excellent varus/valgus and  anterior/posterior balance throughout full range of motion.  Once the cement was fully hardened, the trial was removed and the permanent 12 mm posterior stabilized rotating platform insert was placed in the tibial tray.  Twenty mL of Exparel mixed with  60 mL of saline was injected into the extensor mechanism posterior capsule, subcutaneous tissues, and periosteum of the femur.  The wound was copiously irrigated with saline solution and the arthrotomy closed over a Hemovac drain with a running 0  Stratafix suture.  Tourniquet was released.  Total time of 53 minutes.  Minor bleeding was stopped with electrocautery.  Subcu was closed with interrupted 2-0 Vicryl and subcuticular running 4-0 Monocryl.  The incisions were cleaned and dried, and  Steri-Strips and a bulky sterile dressing were applied.  She was placed into a knee immobilizer, awakened, and transported to recovery in stable condition.  Note that the surgical assistant was a medical necessity for this  procedure.  Surgical assistance was necessary for retraction of vital ligaments and neurovascular structures and for proper positioning of the limb for appropriate placement of the  prosthetic components.  LN/NUANCE  D:12/22/2018 T:12/22/2018 JOB:[id number]

## 2018-12-23 ENCOUNTER — Encounter (HOSPITAL_COMMUNITY): Payer: Self-pay | Admitting: Orthopedic Surgery

## 2018-12-23 DIAGNOSIS — T84032A Mechanical loosening of internal right knee prosthetic joint, initial encounter: Secondary | ICD-10-CM | POA: Diagnosis not present

## 2018-12-23 LAB — BASIC METABOLIC PANEL
Anion gap: 10 (ref 5–15)
BUN: 11 mg/dL (ref 8–23)
CO2: 20 mmol/L — ABNORMAL LOW (ref 22–32)
Calcium: 9.2 mg/dL (ref 8.9–10.3)
Chloride: 108 mmol/L (ref 98–111)
Creatinine, Ser: 0.71 mg/dL (ref 0.44–1.00)
GFR calc Af Amer: >60 mL/min (ref >60)
GFR calc non Af Amer: >60 mL/min (ref >60)
Glucose, Bld: 179 mg/dL — ABNORMAL HIGH (ref 70–99)
Potassium: 4.1 mmol/L (ref 3.5–5.1)
Sodium: 138 mmol/L (ref 135–145)

## 2018-12-23 LAB — GLUCOSE, CAPILLARY
Glucose-Capillary: 153 mg/dL — ABNORMAL HIGH (ref 70–99)
Glucose-Capillary: 207 mg/dL — ABNORMAL HIGH (ref 70–99)

## 2018-12-23 LAB — CBC
HCT: 33.2 % — ABNORMAL LOW (ref 36.0–46.0)
Hemoglobin: 10.9 g/dL — ABNORMAL LOW (ref 12.0–15.0)
MCH: 29.5 pg (ref 26.0–34.0)
MCHC: 32.8 g/dL (ref 30.0–36.0)
MCV: 90 fL (ref 80.0–100.0)
Platelets: 220 10*3/uL (ref 150–400)
RBC: 3.69 MIL/uL — ABNORMAL LOW (ref 3.87–5.11)
RDW: 12.5 % (ref 11.5–15.5)
WBC: 14.7 10*3/uL — ABNORMAL HIGH (ref 4.0–10.5)
nRBC: 0 % (ref 0.0–0.2)

## 2018-12-23 MED ORDER — ASPIRIN 325 MG PO TBEC: 325.0000 mg | DELAYED_RELEASE_TABLET | Freq: Two times a day (BID) | ORAL | 0 refills | Status: AC

## 2018-12-23 MED ORDER — GABAPENTIN 300 MG PO CAPS: 300.0000 mg | ORAL_CAPSULE | Freq: Three times a day (TID) | ORAL | 0 refills | Status: DC

## 2018-12-23 MED ORDER — METHOCARBAMOL 500 MG PO TABS: 500.0000 mg | ORAL_TABLET | Freq: Four times a day (QID) | ORAL | 0 refills | Status: DC | PRN

## 2018-12-23 MED ORDER — TRAMADOL HCL 50 MG PO TABS: 50.0000 mg | ORAL_TABLET | Freq: Four times a day (QID) | ORAL | 0 refills | Status: DC | PRN

## 2018-12-23 MED ORDER — OXYCODONE HCL 5 MG PO TABS: 5.0000 mg | ORAL_TABLET | Freq: Four times a day (QID) | ORAL | 0 refills | Status: DC | PRN

## 2018-12-23 NOTE — Care Management CC44 (Signed)
Condition Code 44 Documentation Completed  Patient Details  Name: KRYSTL ZUKER MRN: [id number] Date of Birth: 14-Feb-1946   Condition Code 44 given:  Yes Patient signature on Condition Code 44 notice:  Yes Documentation of 2 MD's agreement:  Yes Code 44 added to claim:  Yes    Golda Acre, RN 12/23/2018, 9:19 AM

## 2018-12-23 NOTE — Care Management Obs Status (Signed)
MEDICARE OBSERVATION STATUS NOTIFICATION   Patient Details  Name: Kathy Herman MRN: [id number] Date of Birth: 10-Sep-1945   Medicare Observation Status Notification Given:  Yes    Golda Acre, RN 12/23/2018, 9:19 AM

## 2018-12-23 NOTE — Progress Notes (Signed)
   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   Patient seen in rounds by Dr. Lequita Halt. Patient is well, and has had no acute complaints or problems other than pain in the right knee. No issues overnight. Denies chest pain, SOB, or calf pain. Foley catheter removed this AM.  We will continue therapy today.   Objective: Vital signs in last 24 hours: Temp:  [97 F (36.1 C)-98.6 F (37 C)] 97.7 F (36.5 C) (05/21 0541) Pulse Rate:  [58-72] 68 (05/21 0541) Resp:  [7-26] 14 (05/21 0541) BP: (80-143)/(52-79) 101/54 (05/21 0541) SpO2:  [93 %-100 %] 96 % (05/21 0541) Weight:  [79 kg] 79 kg (05/20 1210)  Intake/Output from previous day:  Intake/Output Summary (Last 24 hours) at 12/23/2018 0658 Last data filed at 12/23/2018 0600 Gross per 24 hour  Intake 3308.95 ml  Output 2893 ml  Net 415.95 ml     Intake/Output this shift: Total I/O In: 1220.1 [P.O.:360; I.V.:860.1] Out: 853 [Urine:850; Drains:3]  Labs: Recent Labs    12/23/18 0305  HGB 10.9*   Recent Labs    12/23/18 0305  WBC 14.7*  RBC 3.69*  HCT 33.2*  PLT 220   Recent Labs    12/23/18 0305  NA 138  K 4.1  CL 108  CO2 20*  BUN 11  CREATININE 0.71  GLUCOSE 179*  CALCIUM 9.2   Exam: General - Patient is Alert and Oriented Extremity - Neurologically intact Neurovascular intact Sensation intact distally Dorsiflexion/Plantar flexion intact Dressing - dressing C/D/I Motor Function - intact, moving foot and toes well on exam.   Past Medical History:  Diagnosis Date  . Allergic rhinitis, cause unspecified 05/31/2007  . CERVICAL RADICULOPATHY, RIGHT 01/07/2010  . Diabetes mellitus without complication (HCC)    dx 2019  . GERD 07/13/2007   reports hx of reflux that was causing some dysphagia, reports now improved    . Hypercalcemia    has since stopped supplemnetal calcium and following pcp for mgt   .  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   Weight as of this encounter: 79 kg. Advance diet Up with therapy D/C IV fluids  DVT Prophylaxis - Aspirin Weight bearing as tolerated. D/C O2 and pulse ox and try on room air. Hemovac pulled without difficulty, will continue therapy.  Plan is to go Home after hospital stay. Plan for discharge later today if progresses with therapy and meeting her goals. Scheduled for outpatient physical therapy at [facility] Follow-up in the office in 2 weeks.   Arther Abbott, PA-C Orthopedic Surgery 12/23/2018, 6:58 AM

## 2018-12-23 NOTE — Progress Notes (Signed)
The patient is alert and oriented and has been seen by her physician. The orders for discharge were written. IV has been removed. Went over discharge instructions with patient. She is being discharged via wheelchair with all of her belongings.   

## 2018-12-23 NOTE — Progress Notes (Signed)
Physical Therapy Treatment Patient Details Name: Kathy Herman MRN: [id number] DOB: April 09, 1946 Today's Date: 12/23/2018    History of Present Illness 73 yo female s/p R TKA 5/20. Hx of R uni knee 2018    PT Comments    Pt progressing well;  Lengthy session covering, gait, stairs, HEP. Pt with mild dizziness, BP stable 110/61. Discussed moving slowly with position changes/home safety. Pt Is ready for d/c from PT standpoint  Follow Up Recommendations  Follow surgeon's recommendation for DC plan and follow-up therapies     Equipment Recommendations  None recommended by PT    Recommendations for Other Services       Precautions / Restrictions Precautions Precautions: Fall;Knee Knee Immobilizer - Right: Discontinue once straight leg raise with < 10 degree lag Restrictions Weight Bearing Restrictions: No Other Position/Activity Restrictions: WBAT    Mobility  Bed Mobility               General bed mobility comments: pt coming out of bathroom  Transfers Overall transfer level: Needs assistance Equipment used: Rolling walker (2 wheeled) Transfers: Sit to/from Stand Sit to Stand: Supervision         General transfer comment: verbal cues for hand placement  Ambulation/Gait Ambulation/Gait assistance: Min guard;Supervision Gait Distance (Feet): 80 Feet Assistive device: Rolling walker (2 wheeled) Gait Pattern/deviations: Step-to pattern;Antalgic     General Gait Details: verbal cues for sequence   Stairs Stairs: Yes Stairs assistance: Min guard Stair Management: One rail Right;Step to pattern;With cane Number of Stairs: 3 General stair comments: cues for sequence and safe technique   Wheelchair Mobility    Modified Rankin (Stroke Patients Only)       Balance             Standing balance-Leahy Scale: Fair                              Cognition Arousal/Alertness: Awake/alert Behavior During Therapy: WFL for tasks  assessed/performed Overall Cognitive Status: Within Functional Limits for tasks assessed                                        Exercises Total Joint Exercises Ankle Circles/Pumps: AROM;Both;10 reps Quad Sets: AROM;Both;10 reps Short Arc Quad: AROM;Right;10 reps Heel Slides: AROM;Right;10 reps Hip ABduction/ADduction: AROM;Right;10 reps Straight Leg Raises: AROM;Right;10 reps Goniometric ROM: grossly 5* to 65* right knee flexion    General Comments        Pertinent Vitals/Pain Pain Assessment: 0-10 Pain Score: 5  Pain Location: R knee Pain Descriptors / Indicators: Aching;Sore Pain Intervention(s): Limited activity within patient's tolerance;Monitored during session;Premedicated before session;Repositioned;Ice applied    Home Living                      Prior Function            PT Goals (current goals can now be found in the care plan section) Acute Rehab PT Goals Patient Stated Goal: less pain. regain independence and PLOF. PT Goal Formulation: With patient Time For Goal Achievement: 01/05/19 Potential to Achieve Goals: Good Progress towards PT goals: Progressing toward goals    Frequency    7X/week      PT Plan Current plan remains appropriate    Co-evaluation              AM-PAC  PT "6 Clicks" Mobility   Outcome Measure  Help needed turning from your back to your side while in a flat bed without using bedrails?: A Little Help needed moving from lying on your back to sitting on the side of a flat bed without using bedrails?: A Little Help needed moving to and from a bed to a chair (including a wheelchair)?: A Little Help needed standing up from a chair using your arms (e.g., wheelchair or bedside chair)?: A Little Help needed to walk in hospital room?: A Little Help needed climbing 3-5 steps with a railing? : A Little 6 Click Score: 18    End of Session Equipment Utilized During Treatment: Gait belt;Right knee  immobilizer Activity Tolerance: Patient tolerated treatment well Patient left: in chair;with call bell/phone within reach Nurse Communication: Mobility status PT Visit Diagnosis: Unsteadiness on feet (R26.81);Other abnormalities of gait and mobility (R26.89);Pain Pain - Right/Left: Right Pain - part of body: Knee     Time: 1200-1254 PT Time Calculation (min) (ACUTE ONLY): 54 min  Charges:  $Gait Training: 23-37 mins $Therapeutic Exercise: 23-37 mins                     Drucilla Chaletara Safia Panzer, PT  Pager: [phone] Acute Rehab Dept [facility]): [phone]   12/23/2018    [facility] GlenoaksWILLIAMS,Kathy Basic 12/23/2018, 1:10 PM

## 2018-12-23 NOTE — TOC Transition Note (Signed)
Transition of Care [facility]) - CM/SW Discharge Note   Patient Details  Name: Kathy Herman MRN: [id number] Date of Birth: [birth year]  [facility] CM/SW Contact:  Golda Acre, RN Phone Number: 12/23/2018, 8:38 AM   Clinical Narrative:    Discharged to home with hhc and dme   Final next level of care: Home w Home Health Services Barriers to Discharge: No Barriers Identified   Patient Goals and CMS Choice Patient states their goals for this hospitalization and ongoing recovery are:: o go home and get better and get strong CMS Medicare.gov Compare Post Acute Care list provided to:: Patient Choice offered to / list presented to : Patient  Discharge Placement                       Discharge Plan and Services   Discharge Planning Services: CM Consult Post Acute Care Choice: Durable Medical Equipment, Home Health          DME Arranged: 3-N-1, Walker rolling DME Agency: [employer] Date DME Agency Contacted: 12/23/18 Time DME Agency Contacted: [phone] Representative spoke with at DME Agency: Harrold Donath HH Arranged: PT HH Agency: [facility] (now [facility]) Date HH Agency Contacted: 12/23/18 Time HH Agency Contacted: [phone] Representative spoke with at [facility] Agency: Kathlene November  Social Determinants of Health (SDOH) Interventions     Readmission Risk Interventions No flowsheet data found.

## 2018-12-24 ENCOUNTER — Telehealth: Payer: Self-pay | Admitting: Internal Medicine

## 2018-12-24 NOTE — Telephone Encounter (Signed)
Statins don't really cause constipation, more likely due to pain meds received. If she would like to stop it for up to 2 weeks I am ok with that.

## 2018-12-24 NOTE — Telephone Encounter (Signed)
Copied from CRM [id number]. Topic: Quick Communication - Rx Refill/Question >> Dec 24, 2018 10:55 AM Wyonia Hough E wrote: Medication: simvastatin (ZOCOR) 40 MG tablet Pt had a knee surgery and is taking a statin medication and wants to know if she can come off of it for a while due to the constipation. Pt would like to speak with Dr. Ardyth Harps or her nurse about this matter. Wynelle Link advise

## 2018-12-28 NOTE — Telephone Encounter (Signed)
Patient is aware 

## 2018-12-29 NOTE — Discharge Summary (Signed)
Physician Discharge Summary   Patient ID: Kathy Herman MRN: [id number] DOB/AGE: 1945-12-14 73 y.o.  Admit date: 12/22/2018 Discharge date: 12/23/2018  Primary Diagnosis: Failed right knee unicompartmental replacement   Admission Diagnoses:  Past Medical History:  Diagnosis Date   Allergic rhinitis, cause unspecified 05/31/2007   CERVICAL RADICULOPATHY, RIGHT 01/07/2010   Diabetes mellitus without complication (HCC)    dx 2019   GERD 07/13/2007   reports hx of reflux that was causing some dysphagia, reports now improved     Hypercalcemia    has since stopped supplemnetal calcium and following pcp for mgt    Hyperlipidemia    Discharge Diagnoses:   Active Problems:   OA (osteoarthritis) of knee  Estimated body mass index is 29.88 kg/m as calculated from the following:   Height as of this encounter: 5' 4.02" (1.626 m).   Weight as of this encounter: 79 kg.  Procedure:  Procedure(s) (LRB): Revision right knee uni to total knee arthroplasty (Right)   Consults: None  HPI: The patient is a 73 year old female who had a right knee unicompartmental replacement done approximately 2 years ago.  She has had persistent pain.  Nonoperative management did not address her pain, and it was felt that she potentially had progression of arthritic change or possible loosening of the medial unicompartmental replacement.  Infection workup was negative.  She presents now for revision from a uni to a total knee arthroplasty.  Laboratory Data: Admission on 12/22/2018, Discharged on 12/23/2018  Component Date Value Ref Range Status   Glucose-Capillary 12/22/2018 134* 70 - 99 mg/dL Final   Glucose-Capillary 12/22/2018 161* 70 - 99 mg/dL Final   WBC 40/98/1191 14.7* 4.0 - 10.5 K/uL Final   RBC 12/23/2018 3.69* 3.87 - 5.11 MIL/uL Final   Hemoglobin 12/23/2018 10.9* 12.0 - 15.0 g/dL Final   HCT 47/82/9562 33.2* 36.0 - 46.0 % Final   MCV 12/23/2018 90.0  80.0 - 100.0 fL Final   MCH  12/23/2018 29.5  26.0 - 34.0 pg Final   MCHC 12/23/2018 32.8  30.0 - 36.0 g/dL Final   RDW 13/03/6577 12.5  11.5 - 15.5 % Final   Platelets 12/23/2018 220  150 - 400 K/uL Final   nRBC 12/23/2018 0.0  0.0 - 0.2 % Final   Performed at [facility], [address], [place], [place] [address]   Sodium 12/23/2018 138  135 - 145 mmol/L Final   Potassium 12/23/2018 4.1  3.5 - 5.1 mmol/L Final   Chloride 12/23/2018 108  98 - 111 mmol/L Final   CO2 12/23/2018 20* 22 - 32 mmol/L Final   Glucose, Bld 12/23/2018 179* 70 - 99 mg/dL Final   BUN 95/28/4132 11  8 - 23 mg/dL Final   Creatinine, Ser 12/23/2018 0.71  0.44 - 1.00 mg/dL Final   Calcium 44/08/270 9.2  8.9 - 10.3 mg/dL Final   GFR calc non Af Amer 12/23/2018 >60  >60 mL/min Final   GFR calc Af Amer 12/23/2018 >60  >60 mL/min Final   Anion gap 12/23/2018 10  5 - 15 Final   Performed at [facility], [address], [place], [place] [address]   Glucose-Capillary 12/22/2018 204* 70 - 99 mg/dL Final   Glucose-Capillary 12/23/2018 207* 70 - 99 mg/dL Final   Glucose-Capillary 12/23/2018 153* 70 - 99 mg/dL Final  Hospital Outpatient Visit on 12/17/2018  Component Date Value Ref Range Status   SARS-CoV-2, NAA 12/17/2018 NOT DETECTED  NOT DETECTED Final   Comment: (  NOTE) This test was developed and its performance characteristics determined by [employer]. This test has not been FDA cleared or approved. This test has been authorized by FDA under an Emergency Use Authorization (EUA). This test is only authorized for the duration of time the declaration that circumstances exist justifying the authorization of the emergency use of in vitro diagnostic tests for detection of SARS-CoV-2 virus and/or diagnosis of COVID-19 infection under section 564(b)(1) of the Act, 21 U.S.C. 295AOZ-3(Y)(8), unless the authorization is terminated or revoked sooner. When diagnostic testing is negative,  the possibility of a false negative result should be considered in the context of a patient's recent exposures and the presence of clinical signs and symptoms consistent with COVID-19. An individual without symptoms of COVID-19 and who is not shedding SARS-CoV-2 virus would expect to have a negative (not detected) result in this assay. Performed                           At: [facility] [address] [place], [place] [id number] Jolene Schimke MD [id number]    Coronavirus Source 12/17/2018 NASOPHARYNGEAL   Final   Performed at [facility], [address] [place], [place] [address]  Hospital Outpatient Visit on 12/17/2018  Component Date Value Ref Range Status   MRSA, PCR 12/17/2018 NEGATIVE  NEGATIVE Final   Staphylococcus aureus 12/17/2018 NEGATIVE  NEGATIVE Final   Comment: (NOTE) The Xpert SA Assay (FDA approved for NASAL specimens in patients 42 years of age and older), is one component of a comprehensive surveillance program. It is not intended to diagnose infection nor to guide or monitor treatment. Performed at [facility], [address], [place], [place] [address]    Hgb A1c MFr Bld 12/17/2018 7.0* 4.8 - 5.6 % Final   Comment: (NOTE) Pre diabetes:          5.7%-6.4% Diabetes:              >6.4% Glycemic control for   <7.0% adults with diabetes    Mean Plasma Glucose 12/17/2018 154.2  mg/dL Final   Performed at [facility], [address], [place], [place] [address]   aPTT 12/17/2018 31  24 - 36 seconds Final   Performed at [facility], [address] [place], [place] [address]   WBC 12/17/2018 7.9  4.0 - 10.5 K/uL Final   RBC 12/17/2018 4.40  3.87 - 5.11 MIL/uL Final   Hemoglobin 12/17/2018 13.3  12.0 - 15.0 g/dL Final   HCT 38/75/6433 40.0  36.0 - 46.0 % Final   MCV 12/17/2018 90.9  80.0 - 100.0 fL Final   MCH 12/17/2018 30.2  26.0 - 34.0 pg Final   MCHC 12/17/2018 33.3  30.0 - 36.0  g/dL Final   RDW 29/51/8841 12.6  11.5 - 15.5 % Final   Platelets 12/17/2018 231  150 - 400 K/uL Final   nRBC 12/17/2018 0.0  0.0 - 0.2 % Final   Performed at [facility], [address], [place], [place] [address]   Sodium 12/17/2018 140  135 - 145 mmol/L Final   Potassium 12/17/2018 4.2  3.5 - 5.1 mmol/L Final   Chloride 12/17/2018 110  98 - 111 mmol/L Final   CO2 12/17/2018 24  22 - 32 mmol/L Final   Glucose, Bld 12/17/2018 235* 70 - 99 mg/dL Final   BUN 01/60/1093 14  8 - 23 mg/dL Final  Creatinine, Ser 12/17/2018 0.73  0.44 - 1.00 mg/dL Final   Calcium 96/11/5407 9.8  8.9 - 10.3 mg/dL Final   Total Protein 81/19/1478 6.7  6.5 - 8.1 g/dL Final   Albumin 29/56/2130 3.9  3.5 - 5.0 g/dL Final   AST 86/57/8469 23  15 - 41 U/L Final   ALT 12/17/2018 27  0 - 44 U/L Final   Alkaline Phosphatase 12/17/2018 74  38 - 126 U/L Final   Total Bilirubin 12/17/2018 0.6  0.3 - 1.2 mg/dL Final   GFR calc non Af Amer 12/17/2018 >60  >60 mL/min Final   GFR calc Af Amer 12/17/2018 >60  >60 mL/min Final   Anion gap 12/17/2018 6  5 - 15 Final   Performed at [facility], [address] [place], [place] [address]   Prothrombin Time 12/17/2018 12.6  11.4 - 15.2 seconds Final   INR 12/17/2018 1.0  0.8 - 1.2 Final   Comment: (NOTE) INR goal varies based on device and disease states. Performed at [facility], [address], [place], [place] [address]    ABO/RH(D) 12/17/2018 O POS   Final   Antibody Screen 12/17/2018 NEG   Final   Sample Expiration 12/17/2018 12/25/2018,2359   Final   Extend sample reason 12/17/2018    Final                   Value:NO TRANSFUSIONS OR PREGNANCY IN THE PAST 3 MONTHS Performed at [facility], [address] [place], [place] [address]    Glucose-Capillary 12/17/2018 216* 70 - 99 mg/dL Final   ABO/RH(D) 27/25/3664    Final                   Value:O POS Performed  at [facility], [address], [place], [place] [address]      X-Rays:No results found.  EKG: Orders placed or performed during the hospital encounter of 12/17/18   EKG 12 lead   EKG 12 lead     Hospital Course: KEENAN DIMITROV is a 73 y.o. who was admitted to [facility]. They were brought to the operating room on 12/22/2018 and underwent Procedure(s): Revision right knee uni to total knee arthroplasty.  Patient tolerated the procedure well and was later transferred to the recovery room and then to the orthopaedic floor for postoperative care. They were given PO and IV analgesics for pain control following their surgery. They were given 24 hours of postoperative antibiotics of  Anti-infectives (From admission, onward)   Start     Dose/Rate Route Frequency Ordered Stop   12/22/18 1400  ceFAZolin (ANCEF) IVPB 2g/100 mL premix     2 g 200 mL/hr over 30 Minutes Intravenous Every 6 hours 12/22/18 1034 12/22/18 1957   12/22/18 0645  ceFAZolin (ANCEF) IVPB 2g/100 mL premix     2 g 200 mL/hr over 30 Minutes Intravenous On call to O.R. 12/22/18 4259 12/22/18 0854     and started on DVT prophylaxis in the form of Aspirin.   PT and OT were ordered for total joint protocol. Discharge planning consulted to help with postop disposition and equipment needs.  Patient had a good night on the evening of surgery. They started to get up OOB with therapy on POD #0. Pt was seen during rounds and was ready to go home pending progress with therapy. Hemovac drain was pulled without difficulty. She worked with therapy on POD #1 and  was meeting her goals. Pt was discharged to home later that day in stable condition.  Diet: Regular diet Activity: WBAT Follow-up: in 2 weeks Disposition: [facility] Discharged Condition: stable   Discharge Instructions    Call MD / Call 911   Complete by:  As directed    If you experience chest pain or shortness of  breath, CALL 911 and be transported to the hospital emergency room.  If you develope a fever above 101 F, pus (white drainage) or increased drainage or redness at the wound, or calf pain, call your surgeon's office.   Change dressing   Complete by:  As directed    Change dressing on Friday, then change the dressing daily with sterile 4 x 4 inch gauze dressing and apply TED hose.   Constipation Prevention   Complete by:  As directed    Drink plenty of fluids.  Prune juice may be helpful.  You may use a stool softener, such as Colace (over the counter) 100 mg twice a day.  Use MiraLax (over the counter) for constipation as needed.   Diet - low sodium heart healthy   Complete by:  As directed    Discharge instructions   Complete by:  As directed    Dr. Ollen GrossFrank Aluisio Total Joint Specialist [facility] [address] [place], [place] [address] [phone]  TOTAL KNEE REPLACEMENT POSTOPERATIVE DIRECTIONS  Knee Rehabilitation, Guidelines Following Surgery  Results after knee surgery are often greatly improved when you follow the exercise, range of motion and muscle strengthening exercises prescribed by your doctor. Safety measures are also important to protect the knee from further injury. Any time any of these exercises cause you to have increased pain or swelling in your knee joint, decrease the amount until you are comfortable again and slowly increase them. If you have problems or questions, call your caregiver or physical therapist for advice.   HOME CARE INSTRUCTIONS  Remove items at home which could result in a fall. This includes throw rugs or furniture in walking pathways.  ICE to the affected knee every three hours for 30 minutes at a time and then as needed for pain and swelling.  Continue to use ice on the knee for pain and swelling from surgery. You may notice swelling that will progress down to the foot and ankle.  This is normal after surgery.  Elevate the leg when you  are not up walking on it.   Continue to use the breathing machine which will help keep your temperature down.  It is common for your temperature to cycle up and down following surgery, especially at night when you are not up moving around and exerting yourself.  The breathing machine keeps your lungs expanded and your temperature down. Do not place pillow under knee, focus on keeping the knee straight while resting   DIET You may resume your previous home diet once your are discharged from the hospital.  DRESSING / WOUND CARE / SHOWERING You may change your dressing 3-5 days after surgery.  Then change the dressing every day with sterile gauze.  Please use good hand washing techniques before changing the dressing.  Do not use any lotions or creams on the incision until instructed by your surgeon. You may start showering once you are discharged home but do not submerge the incision under water. Just pat the incision dry and apply a dry gauze dressing on daily. Change the surgical dressing daily and  reapply a dry dressing each time.  ACTIVITY Walk with your walker as instructed. Use walker as long as suggested by your caregivers. Avoid periods of inactivity such as sitting longer than an hour when not asleep. This helps prevent blood clots.  You may resume a sexual relationship in one month or when given the OK by your doctor.  You may return to work once you are cleared by your doctor.  Do not drive a car for 6 weeks or until released by you surgeon.  Do not drive while taking narcotics.  WEIGHT BEARING Weight bearing as tolerated with assist device (walker, cane, etc) as directed, use it as long as suggested by your surgeon or therapist, typically at least 4-6 weeks.  POSTOPERATIVE CONSTIPATION PROTOCOL Constipation - defined medically as fewer than three stools per week and severe constipation as less than one stool per week.  One of the most common issues patients have following surgery  is constipation.  Even if you have a regular bowel pattern at home, your normal regimen is likely to be disrupted due to multiple reasons following surgery.  Combination of anesthesia, postoperative narcotics, change in appetite and fluid intake all can affect your bowels.  In order to avoid complications following surgery, here are some recommendations in order to help you during your recovery period.  Colace (docusate) - Pick up an over-the-counter form of Colace or another stool softener and take twice a day as long as you are requiring postoperative pain medications.  Take with a full glass of water daily.  If you experience loose stools or diarrhea, hold the colace until you stool forms back up.  If your symptoms do not get better within 1 week or if they get worse, check with your doctor.  Dulcolax (bisacodyl) - Pick up over-the-counter and take as directed by the product packaging as needed to assist with the movement of your bowels.  Take with a full glass of water.  Use this product as needed if not relieved by Colace only.   MiraLax (polyethylene glycol) - Pick up over-the-counter to have on hand.  MiraLax is a solution that will increase the amount of water in your bowels to assist with bowel movements.  Take as directed and can mix with a glass of water, juice, soda, coffee, or tea.  Take if you go more than two days without a movement. Do not use MiraLax more than once per day. Call your doctor if you are still constipated or irregular after using this medication for 7 days in a row.  If you continue to have problems with postoperative constipation, please contact the office for further assistance and recommendations.  If you experience "the worst abdominal pain ever" or develop nausea or vomiting, please contact the office immediatly for further recommendations for treatment.  ITCHING  If you experience itching with your medications, try taking only a single pain pill, or even half a pain  pill at a time.  You can also use Benadryl over the counter for itching or also to help with sleep.   TED HOSE STOCKINGS Wear the elastic stockings on both legs for three weeks following surgery during the day but you may remove then at night for sleeping.  MEDICATIONS See your medication summary on the "After Visit Summary" that the nursing staff will review with you prior to discharge.  You may have some home medications which will be placed on hold until you complete the course of blood thinner medication.  It is important for you to complete the blood thinner medication as prescribed by your surgeon.  Continue your approved medications as instructed at time of discharge.  PRECAUTIONS If you experience chest pain or shortness of breath - call 911 immediately for transfer to the hospital emergency department.  If you develop a fever greater that 101 F, purulent drainage from wound, increased redness or drainage from wound, foul odor from the wound/dressing, or calf pain - CONTACT YOUR SURGEON.                                                   FOLLOW-UP APPOINTMENTS Make sure you keep all of your appointments after your operation with your surgeon and caregivers. You should call the office at the above phone number and make an appointment for approximately two weeks after the date of your surgery or on the date instructed by your surgeon outlined in the "After Visit Summary".   RANGE OF MOTION AND STRENGTHENING EXERCISES  Rehabilitation of the knee is important following a knee injury or an operation. After just a few days of immobilization, the muscles of the thigh which control the knee become weakened and shrink (atrophy). Knee exercises are designed to build up the tone and strength of the thigh muscles and to improve knee motion. Often times heat used for twenty to thirty minutes before working out will loosen up your tissues and help with improving the range of motion but do not use heat for  the first two weeks following surgery. These exercises can be done on a training (exercise) mat, on the floor, on a table or on a bed. Use what ever works the best and is most comfortable for you Knee exercises include:  Leg Lifts - While your knee is still immobilized in a splint or cast, you can do straight leg raises. Lift the leg to 60 degrees, hold for 3 sec, and slowly lower the leg. Repeat 10-20 times 2-3 times daily. Perform this exercise against resistance later as your knee gets better.  Quad and Hamstring Sets - Tighten up the muscle on the front of the thigh (Quad) and hold for 5-10 sec. Repeat this 10-20 times hourly. Hamstring sets are done by pushing the foot backward against an object and holding for 5-10 sec. Repeat as with quad sets.  Leg Slides: Lying on your back, slowly slide your foot toward your buttocks, bending your knee up off the floor (only go as far as is comfortable). Then slowly slide your foot back down until your leg is flat on the floor again. Angel Wings: Lying on your back spread your legs to the side as far apart as you can without causing discomfort.  A rehabilitation program following serious knee injuries can speed recovery and prevent re-injury in the future due to weakened muscles. Contact your doctor or a physical therapist for more information on knee rehabilitation.   IF YOU ARE TRANSFERRED TO A SKILLED REHAB FACILITY If the patient is transferred to a skilled rehab facility following release from the hospital, a list of the current medications will be sent to the facility for the patient to continue.  When discharged from the skilled rehab facility, please have the facility set up the patient's Home Health Physical Therapy prior to being released. Also, the skilled facility will be responsible for providing  the patient with their medications at time of release from the facility to include their pain medication, the muscle relaxants, and their blood thinner  medication. If the patient is still at the rehab facility at time of the two week follow up appointment, the skilled rehab facility will also need to assist the patient in arranging follow up appointment in our office and any transportation needs.  MAKE SURE YOU:  Understand these instructions.  Get help right away if you are not doing well or get worse.    Pick up stool softner and laxative for home use following surgery while on pain medications. Do not submerge incision under water. Please use good hand washing techniques while changing dressing each day. May shower starting three days after surgery. Please use a clean towel to pat the incision dry following showers. Continue to use ice for pain and swelling after surgery. Do not use any lotions or creams on the incision until instructed by your surgeon.   Do not put a pillow under the knee. Place it under the heel.   Complete by:  As directed    Driving restrictions   Complete by:  As directed    No driving for two weeks   TED hose   Complete by:  As directed    Use stockings (TED hose) for three weeks on both leg(s).  You may remove them at night for sleeping.   Weight bearing as tolerated   Complete by:  As directed      Allergies as of 12/23/2018      Reactions   Erythromycin Nausea Only      Medication List    TAKE these medications   aspirin 325 MG EC tablet Take 1 tablet (325 mg total) by mouth 2 (two) times daily for 20 days. Then resume one 81 mg aspirin once a day. What changed:    medication strength  how much to take  when to take this  additional instructions   esomeprazole 40 MG capsule Commonly known as:  NEXIUM Take 40 mg by mouth every other day.   FINASTERIDE PO Take 5 mg by mouth daily.   gabapentin 300 MG capsule Commonly known as:  NEURONTIN Take 1 capsule (300 mg total) by mouth 3 (three) times daily. Take a 300 mg capsule three times a day for two weeks following surgery.Then take a 300  mg capsule two times a day for two weeks. Then take a 300 mg capsule once a day for two weeks. Then discontinue.   metFORMIN 500 MG 24 hr tablet Commonly known as:  GLUCOPHAGE-XR Take 2 tablets (1,000 mg total) by mouth daily with breakfast.   methocarbamol 500 MG tablet Commonly known as:  ROBAXIN Take 1 tablet (500 mg total) by mouth every 6 (six) hours as needed for muscle spasms.   oxyCODONE 5 MG immediate release tablet Commonly known as:  Oxy IR/ROXICODONE Take 1-2 tablets (5-10 mg total) by mouth every 6 (six) hours as needed for severe pain.   simvastatin 40 MG tablet Commonly known as:  ZOCOR Take 1 tablet (40 mg total) by mouth at bedtime.   traMADol 50 MG tablet Commonly known as:  ULTRAM Take 1-2 tablets (50-100 mg total) by mouth every 6 (six) hours as needed for moderate pain.            Discharge Care Instructions  (From admission, onward)         Start     Ordered   12/23/18  0000  Weight bearing as tolerated     12/23/18 0702   12/23/18 0000  Change dressing    Comments:  Change dressing on Friday, then change the dressing daily with sterile 4 x 4 inch gauze dressing and apply TED hose.   12/23/18 0702         Follow-up Information    Ollen Gross, MD. Schedule an appointment as soon as possible for a visit on 01/04/2019.   Specialty:  Orthopedic Surgery Contact information: [address] [place] [address] [phone]           Signed: Arther Abbott, PA-C Orthopedic Surgery 12/29/2018, 8:32 AM

## 2019-01-12 ENCOUNTER — Telehealth: Payer: Self-pay | Admitting: Internal Medicine

## 2019-01-12 NOTE — Telephone Encounter (Signed)
Copied from [place] [id number]. Topic: Quick Communication - Rx Refill/Question >> Jan 12, 2019 10:24 AM Burchel, Abbi R wrote: Medication: metFORMIN (GLUCOPHAGE-XR) 500 MG 24 hr tablet   Preferred Pharmacy: [facility] [place], [address] [phone] (Phone) [phone] (Fax)    Pt was advised that RX refills may take up to 3 business days. We ask that you follow-up with your pharmacy.

## 2019-01-13 MED ORDER — METFORMIN HCL ER 500 MG PO TB24: 1000.0000 mg | ORAL_TABLET | Freq: Every day | ORAL | 1 refills | Status: DC

## 2019-01-13 NOTE — Telephone Encounter (Signed)
Refill sent.

## 2019-01-26 ENCOUNTER — Ambulatory Visit: Payer: BLUE CROSS/BLUE SHIELD | Admitting: Internal Medicine

## 2019-01-28 LAB — HM DIABETES EYE EXAM

## 2019-02-03 ENCOUNTER — Other Ambulatory Visit: Payer: Self-pay

## 2019-02-07 ENCOUNTER — Encounter: Payer: Self-pay | Admitting: Internal Medicine

## 2019-02-07 ENCOUNTER — Other Ambulatory Visit: Payer: Self-pay

## 2019-02-07 ENCOUNTER — Ambulatory Visit: Payer: BC Managed Care – PPO | Admitting: Internal Medicine

## 2019-02-07 LAB — BASIC METABOLIC PANEL
BUN: 15 mg/dL (ref 6–23)
CO2: 24 mEq/L (ref 19–32)
Calcium: 11 mg/dL — ABNORMAL HIGH (ref 8.4–10.5)
Chloride: 107 mEq/L (ref 96–112)
Creatinine, Ser: 0.76 mg/dL (ref 0.40–1.20)
GFR: 74.63 mL/min (ref >60.00)
Glucose, Bld: 99 mg/dL (ref 70–99)
Potassium: 4.2 mEq/L (ref 3.5–5.1)
Sodium: 141 mEq/L (ref 135–145)

## 2019-02-07 LAB — VITAMIN D 25 HYDROXY (VIT D DEFICIENCY, FRACTURES): VITD: 29.49 ng/mL — ABNORMAL LOW (ref 30.00–100.00)

## 2019-02-07 LAB — ALBUMIN: Albumin: 4.6 g/dL (ref 3.5–5.2)

## 2019-02-07 NOTE — Progress Notes (Signed)
Name: Kathy Herman  MRN/ DOB: [id number], 01-Apr-1946    Age/ Sex: 73 y.o., female     PCP: Isaac Bliss, Rayford Halsted, MD   Reason for Endocrinology Evaluation: Hypercalcemia      Initial Endocrinology Clinic Visit: 10/26/2018    PATIENT IDENTIFIER: Kathy Herman is a 73 y.o., female with a past medical history of T2DM, Dyslipidemia and DJD. She has followed with [facility] since 10/26/2018  for consultative assistance with management of her hypercalcemia.   HISTORICAL SUMMARY: The patient was first diagnosed with hypercalcemia in 08/2018, at which time she presented for routine work up.  SUBJECTIVE:   During last visit (10/26/2018): She was advised to avoid OTC calcium supplements. Normal PTH and calcium level at the time.   Today (02/07/2019):  [name] is here for a follow up on hypercalcemia. She denies any polyuria, polydipsia or new onset constipation . No renal stones since her last visit to our clinic She consumes ~ 2 servings of calcium     ROS:  As per HPI.   HISTORY:  Past Medical History:  Past Medical History:  Diagnosis Date  . Allergic rhinitis, cause unspecified 05/31/2007  . CERVICAL RADICULOPATHY, RIGHT 01/07/2010  . Diabetes mellitus without complication ([place])    dx 2019  . GERD 07/13/2007   reports hx of reflux that was causing some dysphagia, reports now improved    . Hypercalcemia    has since stopped supplemnetal calcium and following pcp for mgt   . Hyperlipidemia    Past Surgical History:  Past Surgical History:  Procedure Laterality Date  . BLEPHAROPLASTY  2016  . 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 12/22/2018   Procedure: Revision right knee uni to total knee arthroplasty;  Surgeon: Gaynelle Arabian, MD;  Location: [facility];  Service: Orthopedics;  Laterality: Right;  169min  . MEDIAL PARTIAL KNEE REPLACEMENT Right 2018  . TUBAL LIGATION      Social History:  reports that she has never smoked. She has never used smokeless  tobacco. She reports current alcohol use. No history on file for drug. Family History:  Family History  Problem Relation Age of Onset  . Memory loss Mother   . Kidney Stones Father   . Congestive Heart Failure Father      HOME MEDICATIONS: Allergies as of 02/07/2019      Reactions   Erythromycin Nausea Only      Medication List       Accurate as of February 07, 2019  1:43 PM. If you have any questions, ask your nurse or doctor.        acetaminophen 325 MG tablet Commonly known as: TYLENOL Take 650 mg by mouth every 6 (six) hours as needed.   esomeprazole 40 MG capsule Commonly known as: NEXIUM Take 40 mg by mouth every other day.   FINASTERIDE PO Take 5 mg by mouth daily.   gabapentin 300 MG capsule Commonly known as: NEURONTIN Take 1 capsule (300 mg total) by mouth 3 (three) times daily. Take a 300 mg capsule three times a day for two weeks following surgery.Then take a 300 mg capsule two times a day for two weeks. Then take a 300 mg capsule once a day for two weeks. Then discontinue.   metFORMIN 500 MG 24 hr tablet Commonly known as: GLUCOPHAGE-XR Take 2 tablets (1,000 mg total) by mouth daily with breakfast.   methocarbamol 500 MG tablet Commonly known as: ROBAXIN Take 1 tablet (500 mg total)  by mouth every 6 (six) hours as needed for muscle spasms.   oxyCODONE 5 MG immediate release tablet Commonly known as: Oxy IR/ROXICODONE Take 1-2 tablets (5-10 mg total) by mouth every 6 (six) hours as needed for severe pain.   simvastatin 40 MG tablet Commonly known as: ZOCOR Take 1 tablet (40 mg total) by mouth at bedtime.   traMADol 50 MG tablet Commonly known as: ULTRAM Take 1-2 tablets (50-100 mg total) by mouth every 6 (six) hours as needed for moderate pain.         OBJECTIVE:   PHYSICAL EXAM: VS: BP 118/68 (BP Location: Left Arm, Patient Position: Sitting, Cuff Size: Normal)   Pulse 79   Temp 97.9 F (36.6 C)   Ht 5\' 4"  (1.626 m)   Wt 169 lb 6.4 oz (76.8  kg)   SpO2 97%   BMI 29.08 kg/m    EXAM: General: Pt appears well and is in NAD  Hydration: Well-hydrated with moist mucous membranes and good skin turgor  Eyes: External eye exam normal without stare, lid lag or exophthalmos.  EOM intact.  PERRL.  Ears, Nose, Throat: Hearing: Grossly intact bilaterally Dental: Good dentition  Throat: Clear without mass, erythema or exudate  Neck: General: Supple without adenopathy. Thyroid: Thyroid size normal.  No goiter or nodules appreciated. No thyroid bruit.  Lungs: Clear with good BS bilat with no rales, rhonchi, or wheezes  Heart: Auscultation: RRR.  Abdomen: Normoactive bowel sounds, soft, nontender, without masses or organomegaly palpable  Extremities:  BL LE: Trace right pretibial edema normal  Skin: Hair: Texture and amount normal with gender appropriate distribution Skin Inspection: No rashes, acanthosis nigricans/skin tags. No lipohypertrophy Skin Palpation: Skin temperature, texture, and thickness normal to palpation  Neuro: Cranial nerves: II - XII grossly intact  Motor: Normal strength throughout DTRs: 2+ and symmetric in UE without delay in relaxation phase  Mental Status: Judgment, insight: Intact Orientation: Oriented to time, place, and person Memory: Intact for recent and remote events Mood and affect: No depression, anxiety, or agitation     DATA REVIEWED:  Results for Marga MelnickSCHWARTZ, Emmalee S (MRN [id number]) as of 02/09/2019 09:41  Ref. Range 02/07/2019 13:50 02/07/2019 13:50  Sodium Latest Ref Range: 135 - 145 mEq/L 141   Potassium Latest Ref Range: 3.5 - 5.1 mEq/L 4.2   Chloride Latest Ref Range: 96 - 112 mEq/L 107   CO2 Latest Ref Range: 19 - 32 mEq/L 24   Glucose Latest Ref Range: 70 - 99 mg/dL 99   BUN Latest Ref Range: 6 - 23 mg/dL 15   Creatinine Latest Ref Range: 0.40 - 1.20 mg/dL 7.820.76   Calcium Latest Ref Range: 8.6 - 10.4 mg/dL 95.611.0 (H) 21.311.2 (H)  Albumin Latest Ref Range: 3.5 - 5.2 g/dL 4.6   GFR Latest Ref Range:  >60.00 mL/min 74.63   VITD Latest Ref Range: 30.00 - 100.00 ng/mL 29.49 (L)   Results for Marga MelnickSCHWARTZ, Vibha S (MRN [id number]) as of 02/09/2019 09:41  Ref. Range 02/07/2019 13:50  PTH, Intact Latest Ref Range: 14 - 64 pg/mL 20    ASSESSMENT / PLAN / RECOMMENDATIONS:   1. Hypercalcemia:  - On her last visit her calcium / PTH levels were normal, repeat testing today shows elevation of serum calcium with corrected serum calcium  Of 10.52 mg/dL (using serum calcium 62.911.0 mg/dL) - PTH is inappropriately normal.  - This is PTH- mediated hyperclacemi, we discussed D/D of Familial hypocalciuric hypercalcemia (FHH) vs primary hyperparathyroidism (pHPT) - Will proceed  with 24-hr urine collection   Plan:  Encouraged hydration  Avoid OTC calcium supplements  Start OTC Vitamin D3 1000 iu daily   Maintain 2-3 servings of calcium a day     Addendum: Results discussed with pt on 7/6 and 02/09/2019  Signed electronically by: Lyndle HerrlichAbby Jaralla Shamleffer, MD  [facility]  [facility] [address] [place], [place] [address] Phone: [phone] FAX: [phone]      CC: Philip AspenHernandez Acosta, Limmie PatriciaEstela Y, MD [address] [place] [address] Phone: [phone]  Fax: [phone]   Return to Endocrinology clinic as below: Future Appointments  Date Time Provider Department Center  03/11/2019  8:30 AM Philip AspenHernandez Acosta, Limmie PatriciaEstela Y, MD [facility]

## 2019-02-07 NOTE — Patient Instructions (Signed)
-   We will contact you with lab results   - FOODS THAT CONTAIN CALCIUM  Calcium can be found in many foods, not only in dairy products.  Dairy Foods  Yogurt (1 cup) 350 mg  Milk (1 cup) 300 mg  Cheddar cheese (1 oz.) 204 mg  Ricotta cheese, part skim (1/4 cup) 169 mg  Cottage cheese (1 cup) 150 mg  Nondairy Foods  Whole Grain Total cereal (3/4 cup) 1000 mg  Pink salmon with bones, sardines (3 oz., cooked) 181 mg  Black beans (1 cup) 103 mg  Broccoli (1 cup, cooked) 150 mg  Almonds (1 tbsp.) 50 mg  Soy Products  Soy yogurt with calcium (3/4 cup) 300 mg  Soy milk enriched with calcium (1 cup) 300 mg  Tofu, firm or extra firm (1/4 cup) 250 mg  Soy nuts, roasted/salted (1/2 cup) 103 mg

## 2019-02-08 LAB — PTH, INTACT AND CALCIUM
Calcium: 11.2 mg/dL — ABNORMAL HIGH (ref 8.6–10.4)
PTH: 20 pg/mL (ref 14–64)

## 2019-02-09 ENCOUNTER — Encounter: Payer: Self-pay | Admitting: Internal Medicine

## 2019-02-09 ENCOUNTER — Other Ambulatory Visit: Payer: Self-pay | Admitting: Internal Medicine

## 2019-02-11 ENCOUNTER — Other Ambulatory Visit: Payer: Self-pay

## 2019-02-11 ENCOUNTER — Other Ambulatory Visit: Payer: BC Managed Care – PPO

## 2019-02-12 LAB — CREATININE, URINE, 24 HOUR: Creatinine, 24H Ur: 0.82 g/(24.h) (ref 0.50–2.15)

## 2019-02-12 LAB — CALCIUM, URINE, 24 HOUR: Calcium, 24H Urine: 288 mg/24 h — ABNORMAL HIGH

## 2019-02-14 ENCOUNTER — Other Ambulatory Visit: Payer: Self-pay

## 2019-02-21 ENCOUNTER — Telehealth: Payer: Self-pay | Admitting: Internal Medicine

## 2019-02-21 NOTE — Telephone Encounter (Addendum)
Discussed 24-hr urine results with the patient on 7/20 at 1600     Ca/Cr ratio is 0.024 which is consistent with Primary hyperparathyroidism. Pt with hypercalcemia and inappropriately normal PTH level.   No indication for surgical intervention at this time.    If her  24-hr calcium urinary excretion of calcium  is > 300 then she will need to proceed with surgery or if her GFR < 65 , or evidence of osteoporosis or renal stones.    Will consider repeat in 3 months as well as renal images.  In the mean time will proceed with a KUB for any evidence of renal stones or nephrocalcinosis   An order was mailed to the patient.    Pt expressed understanding.     Abby Nena Jordan, MD  [facility]  [facility] [address] [place], [place] [address] Phone: [phone] FAX: [phone]

## 2019-02-25 ENCOUNTER — Ambulatory Visit
Admission: RE | Admit: 2019-02-25 | Discharge: 2019-02-25 | Disposition: A | Discharge status: Home / Self Care | Payer: BC Managed Care – PPO | Source: Ambulatory Visit | Attending: Internal Medicine | Admitting: Internal Medicine

## 2019-02-28 ENCOUNTER — Encounter: Payer: Self-pay | Admitting: Internal Medicine

## 2019-03-11 ENCOUNTER — Other Ambulatory Visit: Payer: Self-pay

## 2019-03-11 ENCOUNTER — Encounter: Payer: Self-pay | Admitting: Internal Medicine

## 2019-03-11 ENCOUNTER — Ambulatory Visit: Payer: BC Managed Care – PPO | Admitting: Internal Medicine

## 2019-03-11 VITALS — BP 110/70 | HR 77 | Temp 97.6°F | Wt 168.4 lb

## 2019-03-11 DIAGNOSIS — E1169 Type 2 diabetes mellitus with other specified complication: Secondary | ICD-10-CM

## 2019-03-11 DIAGNOSIS — E119 Type 2 diabetes mellitus without complications: Secondary | ICD-10-CM | POA: Diagnosis not present

## 2019-03-11 DIAGNOSIS — K219 Gastro-esophageal reflux disease without esophagitis: Secondary | ICD-10-CM

## 2019-03-11 DIAGNOSIS — Z96651 Presence of right artificial knee joint: Secondary | ICD-10-CM

## 2019-03-11 DIAGNOSIS — E785 Hyperlipidemia, unspecified: Secondary | ICD-10-CM

## 2019-03-11 LAB — POCT GLYCOSYLATED HEMOGLOBIN (HGB A1C): Hemoglobin A1C: 6.2 % — AB (ref 4.0–5.6)

## 2019-03-11 NOTE — Progress Notes (Signed)
Established Patient Office Visit     CC/Reason for Visit: Diabetes follow-up  HPI: Kathy Herman is a 73 y.o. female who is coming in today for the above mentioned reasons. Past Medical History is significant for: GERD well-controlled on Nexium, well-controlled type 2 diabetes, right knee osteoarthritis status post knee replacement in May, also has hypercalcemia that is being followed by endocrinology.  She has been recovering slowly but well from her right TKR.  She has another 10 days of rehab.  Orthopedics seems to be pleased with her progression.  She has no acute complaints today.  She is scheduled to follow-up with endocrinology next month for repeat calcium levels.   Past Medical/Surgical History: Past Medical History:  Diagnosis Date  . Allergic rhinitis, cause unspecified 05/31/2007  . CERVICAL RADICULOPATHY, RIGHT 01/07/2010  . Diabetes mellitus without complication ([place])    dx 2019  . GERD 07/13/2007   reports hx of reflux that was causing some dysphagia, reports now improved    . Hypercalcemia    has since stopped supplemnetal calcium and following pcp for mgt   . Hyperlipidemia     Past Surgical History:  Procedure Laterality Date  . BLEPHAROPLASTY  2016  . 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 12/22/2018   Procedure: Revision right knee uni to total knee arthroplasty;  Surgeon: Gaynelle Arabian, MD;  Location: [facility];  Service: Orthopedics;  Laterality: Right;  165min  . MEDIAL PARTIAL KNEE REPLACEMENT Right 2018  . TUBAL LIGATION      Social History:  reports that she has never smoked. She has never used smokeless tobacco. She reports current alcohol use. No history on file for drug.  Allergies: Allergies  Allergen Reactions  . Erythromycin Nausea Only    Family History:  Family History  Problem Relation Age of Onset  . Memory loss Mother   . Kidney Stones Father   . Congestive Heart Failure Father      Current Outpatient Medications:  .   acetaminophen (TYLENOL) 325 MG tablet, Take 650 mg by mouth every 6 (six) hours as needed., Disp: , Rfl:  .  esomeprazole (NEXIUM) 40 MG capsule, Take 40 mg by mouth every other day. , Disp: , Rfl:  .  FINASTERIDE PO, Take 5 mg by mouth daily. , Disp: , Rfl:  .  metFORMIN (GLUCOPHAGE-XR) 500 MG 24 hr tablet, Take 2 tablets (1,000 mg total) by mouth daily with breakfast., Disp: 180 tablet, Rfl: 1 .  simvastatin (ZOCOR) 40 MG tablet, Take 1 tablet (40 mg total) by mouth at bedtime., Disp: 90 tablet, Rfl: 3 .  traMADol (ULTRAM) 50 MG tablet, Take 1-2 tablets (50-100 mg total) by mouth every 6 (six) hours as needed for moderate pain., Disp: 40 tablet, Rfl: 0  Review of Systems:  Constitutional: Denies fever, chills, diaphoresis, appetite change and fatigue.  HEENT: Denies photophobia, eye pain, redness, hearing loss, ear pain, congestion, sore throat, rhinorrhea, sneezing, mouth sores, trouble swallowing, neck pain, neck stiffness and tinnitus.   Respiratory: Denies SOB, DOE, cough, chest tightness,  and wheezing.   Cardiovascular: Denies chest pain, palpitations and leg swelling.  Gastrointestinal: Denies nausea, vomiting, abdominal pain, diarrhea, constipation, blood in stool and abdominal distention.  Genitourinary: Denies dysuria, urgency, frequency, hematuria, flank pain and difficulty urinating.  Endocrine: Denies: hot or cold intolerance, sweats, changes in hair or nails, polyuria, polydipsia. Musculoskeletal: Denies myalgias, back pain, joint swelling, arthralgias and gait problem.  Skin: Denies pallor, rash and wound.  Neurological: Denies  dizziness, seizures, syncope, weakness, light-headedness, numbness and headaches.  Hematological: Denies adenopathy. Easy bruising, personal or family bleeding history  Psychiatric/Behavioral: Denies suicidal ideation, mood changes, confusion, nervousness, sleep disturbance and agitation    Physical Exam: Vitals:   03/11/19 0838  BP: 110/70  Pulse:  77  Temp: 97.6 F (36.4 C)  TempSrc: Temporal  SpO2: 94%  Weight: 168 lb 6.4 oz (76.4 kg)    Body mass index is 28.91 kg/m.    Constitutional: NAD, calm, comfortable Eyes: PERRL, lids and conjunctivae normal ENMT: Mucous membranes are moist.  Respiratory: clear to auscultation bilaterally, no wheezing, no crackles. Normal respiratory effort. No accessory muscle use.  Cardiovascular: Regular rate and rhythm, no murmurs / rubs / gallops. No extremity edema. 2+ pedal pulses. No carotid bruits.  Abdomen: no tenderness, no masses palpated. No hepatosplenomegaly. Bowel sounds positive.  Musculoskeletal: Right knee is edematous as compared to left, she has a fresh anterior knee scar that appears well-healed and without infection  Neurologic: CN 2-12 grossly intact. Sensation intact, DTR normal. Strength 5/5 in all 4.  Psychiatric: Normal judgment and insight. Alert and oriented x 3. Normal mood.    Impression and Plan:  Controlled type 2 diabetes mellitus without complication, without long-term current use of insulin (HCC) -A1c today is 6.2 which is a remarkable improvement from 7 in May. -Continue metformin 1000 mg twice daily. -We have discussed diet in detail today.  Gastroesophageal reflux disease without esophagitis -She has some slight wheezing today which she states is usually due to her reflux. -Continue Nexium.  Hypercalcemia -Has follow-up next month with endocrinology.  Hyperlipidemia associated with type 2 diabetes mellitus (HCC) -Last LDL was 112 in January which is above goal of 70 since she is a diabetic. -Will need to discuss statin at next visit.  S/P TKR (total knee replacement), right -Follow-up with orthopedics scheduled.     Chaya JanEstela Hernandez Acosta, MD [facility]

## 2019-05-09 ENCOUNTER — Ambulatory Visit: Payer: BC Managed Care – PPO | Admitting: Internal Medicine

## 2019-05-26 ENCOUNTER — Other Ambulatory Visit: Payer: Self-pay

## 2019-05-26 DIAGNOSIS — Z20822 Contact with and (suspected) exposure to covid-19: Secondary | ICD-10-CM

## 2019-05-28 LAB — NOVEL CORONAVIRUS, NAA: SARS-CoV-2, NAA: NOT DETECTED

## 2019-05-30 ENCOUNTER — Telehealth: Payer: Self-pay | Admitting: *Deleted

## 2019-05-30 NOTE — Telephone Encounter (Signed)
Copied from [place] [phone]. Topic: General - Other >> May 30, 2019  9:24 AM Rainey Pines A wrote: Patient would like callback from nurse in regards to her negative covid test result and being told she still needs to quarantine.

## 2019-05-31 NOTE — Telephone Encounter (Signed)
Patient spoke with the health department for recommendations.

## 2019-06-07 ENCOUNTER — Other Ambulatory Visit: Payer: Self-pay

## 2019-06-09 ENCOUNTER — Ambulatory Visit (INDEPENDENT_AMBULATORY_CARE_PROVIDER_SITE_OTHER): Payer: BC Managed Care – PPO | Admitting: Internal Medicine

## 2019-06-09 ENCOUNTER — Other Ambulatory Visit: Payer: Self-pay

## 2019-06-09 ENCOUNTER — Encounter: Payer: Self-pay | Admitting: Internal Medicine

## 2019-06-09 DIAGNOSIS — E210 Primary hyperparathyroidism: Secondary | ICD-10-CM | POA: Insufficient documentation

## 2019-06-09 LAB — BASIC METABOLIC PANEL
BUN: 22 mg/dL (ref 6–23)
CO2: 27 mEq/L (ref 19–32)
Calcium: 10.6 mg/dL — ABNORMAL HIGH (ref 8.4–10.5)
Chloride: 107 mEq/L (ref 96–112)
Creatinine, Ser: 0.66 mg/dL (ref 0.40–1.20)
GFR: 87.74 mL/min (ref >60.00)
Glucose, Bld: 112 mg/dL — ABNORMAL HIGH (ref 70–99)
Potassium: 4.5 mEq/L (ref 3.5–5.1)
Sodium: 141 mEq/L (ref 135–145)

## 2019-06-09 LAB — VITAMIN D 25 HYDROXY (VIT D DEFICIENCY, FRACTURES): VITD: 35.65 ng/mL (ref 30.00–100.00)

## 2019-06-09 LAB — ALBUMIN: Albumin: 4.4 g/dL (ref 3.5–5.2)

## 2019-06-09 NOTE — Progress Notes (Signed)
Name: Kathy Herman  MRN/ DOB: [id number], 08-Oct-1945    Age/ Sex: 73 y.o., female     PCP: Isaac Bliss, Rayford Halsted, MD   Reason for Endocrinology Evaluation: Hypercalcemia      Initial Endocrinology Clinic Visit: 10/26/2018    PATIENT IDENTIFIER: Kathy Herman is a 73 y.o., female with a past medical history of T2DM, Dyslipidemia and DJD. She has followed with [facility] since 10/26/2018  for consultative assistance with management of her hypercalcemia.   HISTORICAL SUMMARY: The patient was first diagnosed with hypercalcemia in 08/2018, at which time she presented for routine work up.  Ca/Cr ratio is 0.024 which is consistent with Primary hyperparathyroidism 02/2019 No evidence of renal calculi on KUB 02/2019 DXA shows osteopenia 08/2017  SUBJECTIVE:   During last visit (02/07/2019): She was advised to avoid OTC calcium supplements. Normal PTH and calcium level at the time.   Today (06/10/2019):  Ms. Langlinais is here for a follow up on hypercalcemia. She denies any polyuria, polydipsia but has occasional difficulty defecation No renal stones since her last visit to our clinic She consumes ~ 2 servings of calcium  No vitamin D    ROS:  As per HPI.   HISTORY:  Past Medical History:  Past Medical History:  Diagnosis Date  . Allergic rhinitis, cause unspecified 05/31/2007  . CERVICAL RADICULOPATHY, RIGHT 01/07/2010  . Diabetes mellitus without complication ([place])    dx 2019  . GERD 07/13/2007   reports hx of reflux that was causing some dysphagia, reports now improved    . Hypercalcemia    has since stopped supplemnetal calcium and following pcp for mgt   . Hyperlipidemia    Past Surgical History:  Past Surgical History:  Procedure Laterality Date  . BLEPHAROPLASTY  2016  . 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 12/22/2018   Procedure: Revision right knee uni to total knee arthroplasty;  Surgeon: Gaynelle Arabian, MD;  Location: [facility];  Service:  Orthopedics;  Laterality: Right;  156mn  . MEDIAL PARTIAL KNEE REPLACEMENT Right 2018  . TUBAL LIGATION      Social History:  reports that she has never smoked. She has never used smokeless tobacco. She reports current alcohol use. No history on file for drug. Family History:  Family History  Problem Relation Age of Onset  . Memory loss Mother   . Kidney Stones Father   . Congestive Heart Failure Father      HOME MEDICATIONS: Allergies as of 06/09/2019      Reactions   Erythromycin Nausea Only      Medication List       Accurate as of June 09, 2019 11:59 PM. If you have any questions, ask your nurse or doctor.        STOP taking these medications   traMADol 50 MG tablet Commonly known as: ULTRAM Stopped by: IDorita Sciara MD     TAKE these medications   acetaminophen 325 MG tablet Commonly known as: TYLENOL Take 650 mg by mouth every 6 (six) hours as needed.   esomeprazole 40 MG capsule Commonly known as: NEXIUM Take 40 mg by mouth every other day.   FINASTERIDE PO Take 5 mg by mouth daily.   metFORMIN 500 MG 24 hr tablet Commonly known as: GLUCOPHAGE-XR Take 2 tablets (1,000 mg total) by mouth daily with breakfast.   simvastatin 40 MG tablet Commonly known as: ZOCOR Take 1 tablet (40 mg total) by mouth at bedtime.  OBJECTIVE:   PHYSICAL EXAM: VS: BP 116/74 (BP Location: Left Arm, Patient Position: Sitting, Cuff Size: Normal)   Pulse 74   Temp 98.2 F (36.8 C) (Oral)   Ht 5' 4"  (1.626 m)   Wt 174 lb (78.9 kg)   SpO2 99%   BMI 29.87 kg/m    EXAM: General: Pt appears well and is in NAD  Neck: General: Supple without adenopathy. Thyroid: Thyroid size normal.  No goiter or nodules appreciated. No thyroid bruit.  Lungs: Clear with good BS bilat with no rales, rhonchi, or wheezes  Heart: Auscultation: RRR.  Abdomen: Normoactive bowel sounds, soft, nontender, without masses or organomegaly palpable  Extremities:  BL LE: Trace  right pretibial edema normal  Mental Status: Judgment, insight: Intact Orientation: Oriented to time, place, and person Mood and affect: No depression, anxiety, or agitation     DATA REVIEWED: Results for NAJE, RICE (MRN [id number]) as of 06/10/2019 10:36  Ref. Range 06/09/2019 14:14  Sodium Latest Ref Range: 135 - 145 mEq/L 141  Potassium Latest Ref Range: 3.5 - 5.1 mEq/L 4.5  Chloride Latest Ref Range: 96 - 112 mEq/L 107  CO2 Latest Ref Range: 19 - 32 mEq/L 27  Glucose Latest Ref Range: 70 - 99 mg/dL 112 (H)  BUN Latest Ref Range: 6 - 23 mg/dL 22  Creatinine Latest Ref Range: 0.40 - 1.20 mg/dL 0.66  Calcium Latest Ref Range: 8.4 - 10.5 mg/dL 10.6 (H)  Albumin Latest Ref Range: 3.5 - 5.2 g/dL 4.4  GFR Latest Ref Range: >60.00 mL/min 87.74  VITD Latest Ref Range: 30.00 - 100.00 ng/mL 35.65  PTH, Intact Latest Ref Range: 14 - 64 pg/mL 41     ASSESSMENT / PLAN / RECOMMENDATIONS:   1. Hyperparathyroidism:   - Corrected serum calcium is 10.28 mg/dL with normal PTH levels. This is not unusual for calcium and PTH levels to fluctuate.  - Pt has not met criteria for surgical intervention thus far as below - Criteria for surgical intervention (Serum calcium concentration of 1.0 mg/dL or more above the upper limit of normal,Estimated glomerular filtration rate (eGFR) <60 mL/min, evidence of Osteoporosis on bone density, Twenty-four-hour urinary calcium >300 mg/day ,Nephrolithiasis or nephrocalcinosis by radiograph, Age less than 50 years.)   Plan:  Encouraged hydration  Avoid OTC calcium supplements  Start OTC Vitamin D3 1000 iu daily   Maintain 2-3 servings of calcium a day     F/U in 6 months   Signed electronically by: Mack Guise, MD  [facility]  [facility] [place] [facility] [address] [place], [place] [address] Phone: [phone] FAX: [phone]      CC: Isaac Bliss, Rayford Halsted, MD [place]  [place] [address] Phone: [phone]  Fax: [phone]   Return to Endocrinology clinic as below: Future Appointments  Date Time Provider [place]  09/16/2019  1:00 PM Isaac Bliss, Rayford Halsted, MD [facility]  12/07/2019  3:40 PM Joby Hershkowitz, Melanie Crazier, MD [facility] None

## 2019-06-12 LAB — VITAMIN D 1,25 DIHYDROXY
Vitamin D 1, 25 (OH)2 Total: 73 pg/mL — ABNORMAL HIGH (ref 18–72)
Vitamin D2 1, 25 (OH)2: <8 pg/mL
Vitamin D3 1, 25 (OH)2: 73 pg/mL

## 2019-06-12 LAB — PARATHYROID HORMONE, INTACT (NO CA): PTH: 41 pg/mL (ref 14–64)

## 2019-06-17 ENCOUNTER — Encounter: Payer: Self-pay | Admitting: Internal Medicine

## 2019-06-21 ENCOUNTER — Telehealth: Payer: Self-pay | Admitting: Internal Medicine

## 2019-06-21 MED ORDER — ESOMEPRAZOLE MAGNESIUM 40 MG PO CPDR: 40.0000 mg | DELAYED_RELEASE_CAPSULE | ORAL | 1 refills | Status: DC

## 2019-06-21 NOTE — Telephone Encounter (Signed)
Is she taking esomeprazole or not? If she is and just needs a refill, ok to send. If this is a new problem will need an OV (can be virtual) to discuss.

## 2019-06-21 NOTE — Telephone Encounter (Signed)
Refill sent.

## 2019-06-21 NOTE — Telephone Encounter (Signed)
Copied from [place] [phone]. Topic: General - Other >> Jun 21, 2019 10:55 AM Keene Breath wrote: Reason for CRM: Patient would like for the doctor to prescribe a medication for her reflux.  The medication that she has been taking is Esomeprazole magnesium 40mg .  She stated it made her feel better.  Please advise and call to discuss at [phone]

## 2019-07-15 ENCOUNTER — Other Ambulatory Visit: Payer: Self-pay | Admitting: Internal Medicine

## 2019-07-15 MED ORDER — METFORMIN HCL ER 500 MG PO TB24: 1000.0000 mg | ORAL_TABLET | Freq: Every day | ORAL | 1 refills | Status: DC

## 2019-07-15 NOTE — Telephone Encounter (Signed)
Medication Refill - Medication: metFORMIN (GLUCOPHAGE-XR) 500 MG 24 hr tablet [[id number]]     Preferred Pharmacy (with phone number or street name):  [id number] - [place], [place]  [place] [address]  Phone: [phone] Fax: [phone]     Agent: Please be advised that RX refills may take up to 3 business days. We ask that you follow-up with your pharmacy.

## 2019-09-12 ENCOUNTER — Other Ambulatory Visit: Payer: Self-pay | Admitting: Internal Medicine

## 2019-09-12 DIAGNOSIS — E1169 Type 2 diabetes mellitus with other specified complication: Secondary | ICD-10-CM

## 2019-09-12 DIAGNOSIS — E785 Hyperlipidemia, unspecified: Secondary | ICD-10-CM

## 2019-09-15 ENCOUNTER — Other Ambulatory Visit: Payer: Self-pay

## 2019-09-16 ENCOUNTER — Ambulatory Visit (INDEPENDENT_AMBULATORY_CARE_PROVIDER_SITE_OTHER): Payer: BC Managed Care – PPO | Admitting: Internal Medicine

## 2019-09-16 ENCOUNTER — Encounter: Payer: Self-pay | Admitting: Internal Medicine

## 2019-09-16 VITALS — BP 120/70 | HR 69 | Temp 97.3°F | Wt 175.4 lb

## 2019-09-16 DIAGNOSIS — E785 Hyperlipidemia, unspecified: Secondary | ICD-10-CM

## 2019-09-16 DIAGNOSIS — E1169 Type 2 diabetes mellitus with other specified complication: Secondary | ICD-10-CM

## 2019-09-16 DIAGNOSIS — M1711 Unilateral primary osteoarthritis, right knee: Secondary | ICD-10-CM

## 2019-09-16 DIAGNOSIS — K219 Gastro-esophageal reflux disease without esophagitis: Secondary | ICD-10-CM

## 2019-09-16 DIAGNOSIS — E119 Type 2 diabetes mellitus without complications: Secondary | ICD-10-CM | POA: Diagnosis not present

## 2019-09-16 DIAGNOSIS — E669 Obesity, unspecified: Secondary | ICD-10-CM

## 2019-09-16 LAB — POCT GLYCOSYLATED HEMOGLOBIN (HGB A1C): Hemoglobin A1C: 7.2 % — AB (ref 4.0–5.6)

## 2019-09-16 NOTE — Progress Notes (Signed)
Established Patient Office Visit     This visit occurred during the SARS-CoV-2 public health emergency.  Safety protocols were in place, including screening questions prior to the visit, additional usage of staff PPE, and extensive cleaning of exam room while observing appropriate contact time as indicated for disinfecting solutions.    CC/Reason for Visit: Follow-up chronic conditions  HPI: Kathy Herman is a 74 y.o. female who is coming in today for the above mentioned reasons. 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  She thinks her A1c is higher as she has had some dietary indiscretions.  She has lots of questions about available diet plans on the market.   Past Medical/Surgical History: Past Medical History:  Diagnosis Date  . Allergic rhinitis, cause unspecified 05/31/2007  . CERVICAL RADICULOPATHY, RIGHT 01/07/2010  . Diabetes mellitus without complication ([place])    dx 2019  . GERD 07/13/2007   reports hx of reflux that was causing some dysphagia, reports now improved    . Hypercalcemia    has since stopped supplemnetal calcium and following pcp for mgt   . Hyperlipidemia     Past Surgical History:  Procedure Laterality Date  . BLEPHAROPLASTY  2016  . 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 12/22/2018   Procedure: Revision right knee uni to total knee arthroplasty;  Surgeon: Gaynelle Arabian, MD;  Location: [facility];  Service: Orthopedics;  Laterality: Right;  12min  . MEDIAL PARTIAL KNEE REPLACEMENT Right 2018  . TUBAL LIGATION      Social History:  reports that she has never smoked. She has never used smokeless tobacco. She reports current alcohol use. No history on file for drug.  Allergies: Allergies  Allergen Reactions  .  Erythromycin Nausea Only    Family History:  Family History  Problem Relation Age of Onset  . Memory loss Mother   . Kidney Stones Father   . Congestive Heart Failure Father      Current Outpatient Medications:  .  acetaminophen (TYLENOL) 325 MG tablet, Take 650 mg by mouth every 6 (six) hours as needed., Disp: , Rfl:  .  esomeprazole (NEXIUM) 40 MG capsule, Take 1 capsule (40 mg total) by mouth every other day., Disp: 90 capsule, Rfl: 1 .  FINASTERIDE PO, Take 5 mg by mouth daily. , Disp: , Rfl:  .  metFORMIN (GLUCOPHAGE-XR) 500 MG 24 hr tablet, Take 2 tablets (1,000 mg total) by mouth daily with breakfast., Disp: 180 tablet, Rfl: 1 .  simvastatin (ZOCOR) 40 MG tablet, TAKE 1 TABLET(40 MG) BY MOUTH AT BEDTIME, Disp: 90 tablet, Rfl: 0  Review of Systems:  Constitutional: Denies fever, chills, diaphoresis, appetite change and fatigue.  HEENT: Denies photophobia, eye pain, redness, hearing loss, ear pain, congestion, sore throat, rhinorrhea, sneezing, mouth sores, trouble swallowing, neck pain, neck stiffness and tinnitus.   Respiratory: Denies SOB, DOE, cough, chest tightness,  and wheezing.   Cardiovascular: Denies chest pain, palpitations and leg swelling.  Gastrointestinal: Denies nausea, vomiting, abdominal pain, diarrhea, constipation, blood in stool and abdominal distention.  Genitourinary: Denies dysuria, urgency, frequency, hematuria, flank pain and difficulty urinating.  Endocrine: Denies: hot or cold intolerance, sweats, changes in hair or nails, polyuria, polydipsia. Musculoskeletal: Denies myalgias, back pain, joint swelling, arthralgias and gait problem.  Skin: Denies pallor, rash and wound.  Neurological: Denies dizziness, seizures, syncope, weakness, light-headedness, numbness and headaches.  Hematological: Denies adenopathy. Easy bruising, personal or family bleeding history  Psychiatric/Behavioral: Denies suicidal ideation, mood changes, confusion, nervousness, sleep  disturbance and agitation    Physical Exam: Vitals:   09/16/19 1311  BP: 120/70  Pulse: 69  Temp: (!) 97.3 F (36.3 C)  TempSrc: Temporal  SpO2: 98%  Weight: 175 lb 6.4 oz (79.6 kg)    Body mass index is 30.11 kg/m.   Constitutional: NAD, calm, comfortable Eyes: PERRL, lids and conjunctivae normal ENMT: Mucous membranes are moist. Respiratory: clear to auscultation bilaterally, no wheezing, no crackles. Normal respiratory effort. No accessory muscle use.  Cardiovascular: Regular rate and rhythm, no murmurs / rubs / gallops. No extremity edema.   Neurologic: Grossly intact and nonfocal Psychiatric: Normal judgment and insight. Alert and oriented x 3. Normal mood.    Impression and Plan:  Controlled type 2 diabetes mellitus without complication, without long-term current use of insulin (HCC)  -As expected her A1c has increased from 6.2 in August to 7.2 today primarily due to dietary indiscretions. -She will work on lifestyle modifications and return in 3 months for follow-up.  Hypercalcemia -She follows with endocrinology for this, it is thought secondary to primary hyperparathyroidism  Gastroesophageal reflux disease without esophagitis -Well-controlled on PPI therapy.  Hyperlipidemia associated with type 2 diabetes mellitus (HCC) -Last LDL was 112 in 2020, goal of less than 70. -Recheck LDL next physical.  Primary osteoarthritis of right knee -Status post replacement, she is frustrated with her progress.  Obesity (BMI 30.0-34.9) -Discussed healthy lifestyle, including increased physical activity and better food choices to promote weight loss.    Patient Instructions  -Nice seeing you today!!  -Schedule a 3 month follow up.     [name], MD [facility]

## 2019-09-16 NOTE — Patient Instructions (Signed)
-  Nice seeing you today!!  -Schedule a 3 month follow up. 

## 2019-10-04 ENCOUNTER — Telehealth: Payer: Self-pay | Admitting: *Deleted

## 2019-10-04 NOTE — Telephone Encounter (Signed)
Prior Auth has been started for Esomprazole 40mg .  Key: 

## 2019-10-06 ENCOUNTER — Telehealth: Payer: Self-pay | Admitting: Internal Medicine

## 2019-10-06 MED ORDER — OMEPRAZOLE 20 MG PO CPDR: 40.0000 mg | DELAYED_RELEASE_CAPSULE | Freq: Every day | ORAL | 3 refills | Status: DC

## 2019-10-06 NOTE — Telephone Encounter (Signed)
Medication list updated.

## 2019-10-06 NOTE — Telephone Encounter (Signed)
Prior authorization has been denied.  Esomeprazole does not meet the definition of medical necessity. Covered medication include lansoprazole capsules, omeprazole capsules, rabeprazole.

## 2019-10-06 NOTE — Telephone Encounter (Signed)
Prior authorization has been denied.  Esomeprazole does not meet the definition of medical necessity. Covered medication include lansoprazole capsules, omeprazole capsules, rabeprazole.   Spoke with patient and she would like to try one of the covered medication.    Please advise

## 2019-10-06 NOTE — Telephone Encounter (Signed)
Pt insurance will not cover Esomeprazole without a PA. Pt would like for it to be set up.   Insurance: [employer] Phone: [phone]  Pt is out of the medication and wondering if there is a good over the counter medication she can take in the mean time.  Pt can be reached at [phone]

## 2019-10-10 ENCOUNTER — Telehealth: Payer: Self-pay | Admitting: Internal Medicine

## 2019-10-10 NOTE — Telephone Encounter (Signed)
   Before she has been taking esomeprazole (NEXIUM) 40 MG capsule   now she was prescribed   omeprazole (PRILOSEC) 20 MG capsule   The directions has it for the patient to take 2 capsules by mouth daily doesn't say anything about taking before or after a meal. If she does that then it will only last her 15 days.  3 refills til October 05, 2020  Please advise [phone]

## 2019-10-11 ENCOUNTER — Telehealth: Payer: Self-pay | Admitting: Internal Medicine

## 2019-10-11 MED ORDER — OMEPRAZOLE 40 MG PO CPDR: 40.0000 mg | DELAYED_RELEASE_CAPSULE | Freq: Every morning | ORAL | 3 refills | Status: DC

## 2019-10-11 NOTE — Telephone Encounter (Signed)
New Rx sent.

## 2019-10-11 NOTE — Addendum Note (Signed)
Addended by: Kern Reap B on: 10/11/2019 01:05 PM   Modules accepted: Orders

## 2019-10-11 NOTE — Telephone Encounter (Signed)
Pt has called back to get further instructions from yesterday msg.

## 2019-10-11 NOTE — Telephone Encounter (Signed)
Cancel.

## 2019-11-01 LAB — HM MAMMOGRAPHY

## 2019-11-02 ENCOUNTER — Encounter: Payer: Self-pay | Admitting: Internal Medicine

## 2019-12-05 ENCOUNTER — Other Ambulatory Visit: Payer: Self-pay | Admitting: Internal Medicine

## 2019-12-05 DIAGNOSIS — E1169 Type 2 diabetes mellitus with other specified complication: Secondary | ICD-10-CM

## 2019-12-07 ENCOUNTER — Ambulatory Visit: Payer: BC Managed Care – PPO | Admitting: Internal Medicine

## 2019-12-13 LAB — HM DEXA SCAN

## 2019-12-14 ENCOUNTER — Other Ambulatory Visit: Payer: Self-pay

## 2019-12-15 ENCOUNTER — Ambulatory Visit: Payer: BC Managed Care – PPO | Admitting: Internal Medicine

## 2019-12-16 ENCOUNTER — Encounter: Payer: Self-pay | Admitting: Internal Medicine

## 2019-12-16 ENCOUNTER — Ambulatory Visit: Payer: BC Managed Care – PPO | Admitting: Internal Medicine

## 2019-12-16 ENCOUNTER — Other Ambulatory Visit: Payer: Self-pay

## 2019-12-16 VITALS — BP 122/78 | HR 66 | Temp 98.1°F | Ht 64.0 in | Wt 171.0 lb

## 2019-12-16 DIAGNOSIS — E210 Primary hyperparathyroidism: Secondary | ICD-10-CM | POA: Diagnosis not present

## 2019-12-16 NOTE — Patient Instructions (Signed)
-   Stay hydrated  - Continue to avoid over the counter calcium supplements - Continue to consume 2-3 servings of calcium in your diet daily

## 2019-12-16 NOTE — Progress Notes (Signed)
Name: Kathy Herman  MRN/ DOB: [id number], May 17, 1946    Age/ Sex: 74 y.o., female     PCP: Isaac Bliss, Rayford Halsted, MD   Reason for Endocrinology Evaluation: Hypercalcemia      Initial Endocrinology Clinic Visit: 10/26/2018    PATIENT IDENTIFIER: Kathy Herman is a 74 y.o., female with a past medical history of T2DM, Dyslipidemia and DJD. She has followed with [facility] since 10/26/2018  for consultative assistance with management of her hypercalcemia.   HISTORICAL SUMMARY: The patient was first diagnosed with hypercalcemia in 08/2018.   Ca/Cr ratio is 0.024 which is consistent with Primary hyperparathyroidism 02/2019 No evidence of renal calculi on KUB 02/2019 DXA shows osteopenia 08/2017  SUBJECTIVE:   Today (12/16/2019):  Kathy Herman is here for a follow up on hypercalcemia. She denies any polyuria, polydipsia  No renal stones since her last visit to our clinic She consumes ~ 2 servings of calcium  No vitamin D  Had a DXA ordered by GYN -    ROS:  As per HPI.   HISTORY:  Past Medical History:  Past Medical History:  Diagnosis Date  . Allergic rhinitis, cause unspecified 05/31/2007  . CERVICAL RADICULOPATHY, RIGHT 01/07/2010  . Diabetes mellitus without complication ([place])    dx 2019  . GERD 07/13/2007   reports hx of reflux that was causing some dysphagia, reports now improved    . Hypercalcemia    has since stopped supplemnetal calcium and following pcp for mgt   . Hyperlipidemia    Past Surgical History:  Past Surgical History:  Procedure Laterality Date  . BLEPHAROPLASTY  2016  . 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 12/22/2018   Procedure: Revision right knee uni to total knee arthroplasty;  Surgeon: Gaynelle Arabian, MD;  Location: [facility];  Service: Orthopedics;  Laterality: Right;  110mn  . MEDIAL PARTIAL KNEE REPLACEMENT Right 2018  . TUBAL LIGATION      Social History:  reports that she has never smoked. She has never used smokeless  tobacco. She reports current alcohol use. No history on file for drug. Family History:  Family History  Problem Relation Age of Onset  . Memory loss Mother   . Kidney Stones Father   . Congestive Heart Failure Father      HOME MEDICATIONS: Allergies as of 12/16/2019      Reactions   Erythromycin Nausea Only      Medication List       Accurate as of Dec 16, 2019 12:52 PM. If you have any questions, ask your nurse or doctor.        acetaminophen 325 MG tablet Commonly known as: TYLENOL Take 650 mg by mouth every 6 (six) hours as needed.   FINASTERIDE PO Take 5 mg by mouth daily.   metFORMIN 500 MG 24 hr tablet Commonly known as: GLUCOPHAGE-XR Take 2 tablets (1,000 mg total) by mouth daily with breakfast.   omeprazole 40 MG capsule Commonly known as: PRILOSEC Take 1 capsule (40 mg total) by mouth in the morning.   simvastatin 40 MG tablet Commonly known as: ZOCOR TAKE 1 TABLET(40 MG) BY MOUTH AT BEDTIME         OBJECTIVE:   PHYSICAL EXAM: VS: There were no vitals taken for this visit.   EXAM: General: Pt appears well and is in NAD  Neck: General: Supple without adenopathy. Thyroid: Thyroid size normal.  No goiter or nodules appreciated. No thyroid bruit.  Lungs: Clear with good  BS bilat with no rales, rhonchi, or wheezes  Heart: Auscultation: RRR.  Abdomen: Normoactive bowel sounds, soft, nontender, without masses or organomegaly palpable  Extremities:  BL LE: Trace right pretibial edema normal  Mental Status: Judgment, insight: Intact Orientation: Oriented to time, place, and person Mood and affect: No depression, anxiety, or agitation     DATA REVIEWED: Results for Kathy Herman, Kathy Herman (MRN [id number]) as of 12/19/2019 09:23  Ref. Range 12/16/2019 15:21  Sodium Latest Ref Range: 135 - 146 mmol/L 140  Potassium Latest Ref Range: 3.5 - 5.3 mmol/L 4.6  Chloride Latest Ref Range: 98 - 110 mmol/L 105  CO2 Latest Ref Range: 20 - 32 mmol/L 26  Glucose Latest  Ref Range: 65 - 99 mg/dL 100 (H)  BUN Latest Ref Range: 7 - 25 mg/dL 18  Creatinine Latest Ref Range: 0.60 - 0.93 mg/dL 0.81  Calcium Latest Ref Range: 8.6 - 10.4 mg/dL 11.0 (H)  BUN/Creatinine Ratio Latest Ref Range: 6 - 22 (calc) NOT APPLICABLE  Vitamin D, [id number] Latest Ref Range: 30 - 100 ng/mL 32  Albumin MSPROF Latest Ref Range: 3.6 - 5.1 g/dL 4.6      PTH- pending   ASSESSMENT / PLAN / RECOMMENDATIONS:   1. Hyperparathyroidism:   - Corrected serum calcium is 10.28 mg/dL with normal PTH levels. This is not unusual for calcium and PTH levels to fluctuate.  - Pt has not met criteria for surgical intervention thus far as below - Criteria for surgical intervention (Serum calcium concentration of 1.0 mg/dL or more above the upper limit of normal,Estimated glomerular filtration rate (eGFR) <60 mL/min, evidence of Osteoporosis on bone density, Twenty-four-hour urinary calcium >300 mg/day ,Nephrolithiasis or nephrocalcinosis by radiograph, Age less than 50 years.) - Corrected calcium 10.52 mg/dL.  - Since its been almost a yr since the last 24-hr urine collection , will proceed with repeating as the last time it was at the upper - normal level.    Plan:  Encouraged hydration  Avoid OTC calcium supplements  Start OTC Vitamin D3 1000 iu daily   Maintain 2-3 servings of calcium a day     F/U in 6 months   Signed electronically by: Mack Guise, MD  [facility]  [address] [place], [place] [address] Phone: [phone] FAX: [phone]      CC: Isaac Bliss, Rayford Halsted, MD [place] [address] Phone: [phone]  Fax: [phone]   Return to Endocrinology clinic as below: Future Appointments  Date Time Provider [place]  12/16/2019  2:40 PM Chi Woodham, Melanie Crazier, MD [id number] None  01/26/2020  1:30 PM Isaac Bliss, Rayford Halsted, MD [facility]

## 2019-12-19 LAB — PARATHYROID HORMONE, INTACT (NO CA): PTH: 44 pg/mL (ref 14–64)

## 2019-12-19 LAB — BASIC METABOLIC PANEL
BUN: 18 mg/dL (ref 7–25)
CO2: 26 mmol/L (ref 20–32)
Calcium: 11 mg/dL — ABNORMAL HIGH (ref 8.6–10.4)
Chloride: 105 mmol/L (ref 98–110)
Creat: 0.81 mg/dL (ref 0.60–0.93)
Glucose, Bld: 100 mg/dL — ABNORMAL HIGH (ref 65–99)
Potassium: 4.6 mmol/L (ref 3.5–5.3)
Sodium: 140 mmol/L (ref 135–146)

## 2019-12-19 LAB — ALBUMIN: Albumin: 4.6 g/dL (ref 3.6–5.1)

## 2019-12-19 LAB — VITAMIN D 25 HYDROXY (VIT D DEFICIENCY, FRACTURES): Vit D, 25-Hydroxy: 32 ng/mL (ref 30–100)

## 2019-12-24 ENCOUNTER — Other Ambulatory Visit: Payer: Self-pay | Admitting: Internal Medicine

## 2020-01-16 ENCOUNTER — Other Ambulatory Visit: Payer: Self-pay

## 2020-01-16 ENCOUNTER — Other Ambulatory Visit: Payer: BC Managed Care – PPO

## 2020-01-17 ENCOUNTER — Other Ambulatory Visit: Payer: BC Managed Care – PPO

## 2020-01-17 DIAGNOSIS — E210 Primary hyperparathyroidism: Secondary | ICD-10-CM

## 2020-01-18 LAB — CREATININE, URINE, 24 HOUR: Creatinine, 24H Ur: 1.08 g/(24.h) (ref 0.50–2.15)

## 2020-01-18 LAB — CALCIUM, URINE, 24 HOUR: Calcium, 24H Urine: 176 mg/24 h

## 2020-01-25 ENCOUNTER — Other Ambulatory Visit: Payer: Self-pay

## 2020-01-26 ENCOUNTER — Encounter: Payer: Self-pay | Admitting: Internal Medicine

## 2020-01-26 ENCOUNTER — Ambulatory Visit (INDEPENDENT_AMBULATORY_CARE_PROVIDER_SITE_OTHER): Payer: BC Managed Care – PPO | Admitting: Internal Medicine

## 2020-01-26 VITALS — BP 110/70 | HR 59 | Temp 97.6°F | Wt 164.8 lb

## 2020-01-26 DIAGNOSIS — E785 Hyperlipidemia, unspecified: Secondary | ICD-10-CM

## 2020-01-26 DIAGNOSIS — E119 Type 2 diabetes mellitus without complications: Secondary | ICD-10-CM

## 2020-01-26 DIAGNOSIS — E210 Primary hyperparathyroidism: Secondary | ICD-10-CM

## 2020-01-26 DIAGNOSIS — K219 Gastro-esophageal reflux disease without esophagitis: Secondary | ICD-10-CM | POA: Diagnosis not present

## 2020-01-26 DIAGNOSIS — E1169 Type 2 diabetes mellitus with other specified complication: Secondary | ICD-10-CM

## 2020-01-26 LAB — POCT GLYCOSYLATED HEMOGLOBIN (HGB A1C): Hemoglobin A1C: 6.4 % — AB (ref 4.0–5.6)

## 2020-01-26 MED ORDER — PANTOPRAZOLE SODIUM 40 MG PO TBEC: 40.0000 mg | DELAYED_RELEASE_TABLET | Freq: Every day | ORAL | 1 refills | Status: DC

## 2020-01-26 NOTE — Patient Instructions (Signed)
-  Nice seeing you today!!  -Start protonix 40 mg daily.  -See you back in 3-4 months.

## 2020-01-26 NOTE — Progress Notes (Signed)
Established Patient Office Visit     This visit occurred during the SARS-CoV-2 public health emergency.  Safety protocols were in place, including screening questions prior to the visit, additional usage of staff PPE, and extensive cleaning of exam room while observing appropriate contact time as indicated for disinfecting solutions.    CC/Reason for Visit: Follow-up chronic medical conditions  HPI: Kathy Herman is a 74 y.o. female who is coming in today for the above mentioned reasons. Past Medical History is significant for: GERD that has been well controlled on Nexium, type 2 diabetes with a recent A1c that had increased to 7.2, status post right knee replacement in May 2020, hyperparathyroidism followed by endocrinology.  She was notified by her insurance that they would no longer cover the cost of Nexium and she is wondering about alternative treatments.  She started the [place] and has lost 6 pounds since her last visit.   Past Medical/Surgical History: Past Medical History:  Diagnosis Date  . Allergic rhinitis, cause unspecified 05/31/2007  . CERVICAL RADICULOPATHY, RIGHT 01/07/2010  . Diabetes mellitus without complication ([place])    dx 2019  . GERD 07/13/2007   reports hx of reflux that was causing some dysphagia, reports now improved    . Hypercalcemia    has since stopped supplemnetal calcium and following pcp for mgt   . Hyperlipidemia     Past Surgical History:  Procedure Laterality Date  . BLEPHAROPLASTY  2016  . 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 12/22/2018   Procedure: Revision right knee uni to total knee arthroplasty;  Surgeon: Gaynelle Arabian, MD;  Location: [facility];  Service: Orthopedics;  Laterality: Right;  158mn  . MEDIAL PARTIAL KNEE REPLACEMENT Right 2018  . TUBAL LIGATION      Social History:  reports that she has never smoked. She has never used smokeless tobacco. She reports current alcohol use. No history on file for drug  use.  Allergies: Allergies  Allergen Reactions  . Erythromycin Nausea Only    Family History:  Family History  Problem Relation Age of Onset  . Memory loss Mother   . Kidney Stones Father   . Congestive Heart Failure Father      Current Outpatient Medications:  .  acetaminophen (TYLENOL) 325 MG tablet, Take 650 mg by mouth every 6 (six) hours as needed., Disp: , Rfl:  .  FINASTERIDE PO, Take 5 mg by mouth daily. , Disp: , Rfl:  .  metFORMIN (GLUCOPHAGE-XR) 500 MG 24 hr tablet, TAKE 2 TABLETS(1000 MG) BY MOUTH DAILY WITH BREAKFAST, Disp: 180 tablet, Rfl: 0 .  omeprazole (PRILOSEC) 40 MG capsule, Take 1 capsule (40 mg total) by mouth in the morning., Disp: 30 capsule, Rfl: 3 .  simvastatin (ZOCOR) 40 MG tablet, TAKE 1 TABLET(40 MG) BY MOUTH AT BEDTIME, Disp: 90 tablet, Rfl: 0 .  pantoprazole (PROTONIX) 40 MG tablet, Take 1 tablet (40 mg total) by mouth daily., Disp: 90 tablet, Rfl: 1  Review of Systems:  Constitutional: Denies fever, chills, diaphoresis, appetite change and fatigue.  HEENT: Denies photophobia, eye pain, redness, hearing loss, ear pain, congestion, sore throat, rhinorrhea, sneezing, mouth sores, trouble swallowing, neck pain, neck stiffness and tinnitus.   Respiratory: Denies SOB, DOE, cough, chest tightness,  and wheezing.   Cardiovascular: Denies chest pain, palpitations and leg swelling.  Gastrointestinal: Denies nausea, vomiting, abdominal pain, diarrhea, constipation, blood in stool and abdominal distention.  Genitourinary: Denies dysuria, urgency, frequency, hematuria, flank pain and difficulty urinating.  Endocrine: Denies: hot or cold intolerance, sweats, changes in hair or nails, polyuria, polydipsia. Musculoskeletal: Denies myalgias, back pain, joint swelling, arthralgias and gait problem.  Skin: Denies pallor, rash and wound.  Neurological: Denies dizziness, seizures, syncope, weakness, light-headedness, numbness and headaches.  Hematological: Denies  adenopathy. Easy bruising, personal or family bleeding history  Psychiatric/Behavioral: Denies suicidal ideation, mood changes, confusion, nervousness, sleep disturbance and agitation    Physical Exam: Vitals:   01/26/20 1333  BP: 110/70  Pulse: (!) 59  Temp: 97.6 F (36.4 C)  TempSrc: Temporal  SpO2: 97%  Weight: 164 lb 12.8 oz (74.8 kg)    Body mass index is 28.29 kg/m.   Constitutional: NAD, calm, comfortable Eyes: PERRL, lids and conjunctivae normal ENMT: Mucous membranes are moist.  Respiratory: clear to auscultation bilaterally, no wheezing, no crackles. Normal respiratory effort. No accessory muscle use.  Cardiovascular: Regular rate and rhythm, no murmurs / rubs / gallops. No extremity edema Neurologic: Grossly intact and nonfocal Psychiatric: Normal judgment and insight. Alert and oriented x 3. Normal mood.    Impression and Plan:  Controlled type 2 diabetes mellitus without complication, without long-term current use of insulin (HCC) -A1c in office today was 6.4. -Congratulated on weight loss.  Primary hyperparathyroidism ([place]) Hypercalcemia -Followed by endocrinology, has not yet met criteria for surgery.  Gastroesophageal reflux disease without esophagitis -Will attempt pantoprazole given insurance does not want to cover esomeprazole.  Hyperlipidemia associated with type 2 diabetes mellitus ([place]) -Last LDL was 112, not at goal.  She was started on simvastatin, recheck lipids at next visit.   Patient Instructions  -Nice seeing you today!!  -Start protonix 40 mg daily.  -See you back in 3-4 months.     [name], MD [facility]

## 2020-02-22 ENCOUNTER — Encounter: Payer: Self-pay | Admitting: Internal Medicine

## 2020-03-23 ENCOUNTER — Other Ambulatory Visit: Payer: Self-pay | Admitting: Internal Medicine

## 2020-03-23 DIAGNOSIS — E785 Hyperlipidemia, unspecified: Secondary | ICD-10-CM

## 2020-03-23 DIAGNOSIS — E1169 Type 2 diabetes mellitus with other specified complication: Secondary | ICD-10-CM

## 2020-03-30 IMAGING — CR ABDOMEN - 1 VIEW
1 series · 1 of 1 positions shown · non-contrast
Comparison: None.

CLINICAL DATA: Hypercalcemia.

EXAM:
ABDOMEN - 1 VIEW

[t abdomen supine]
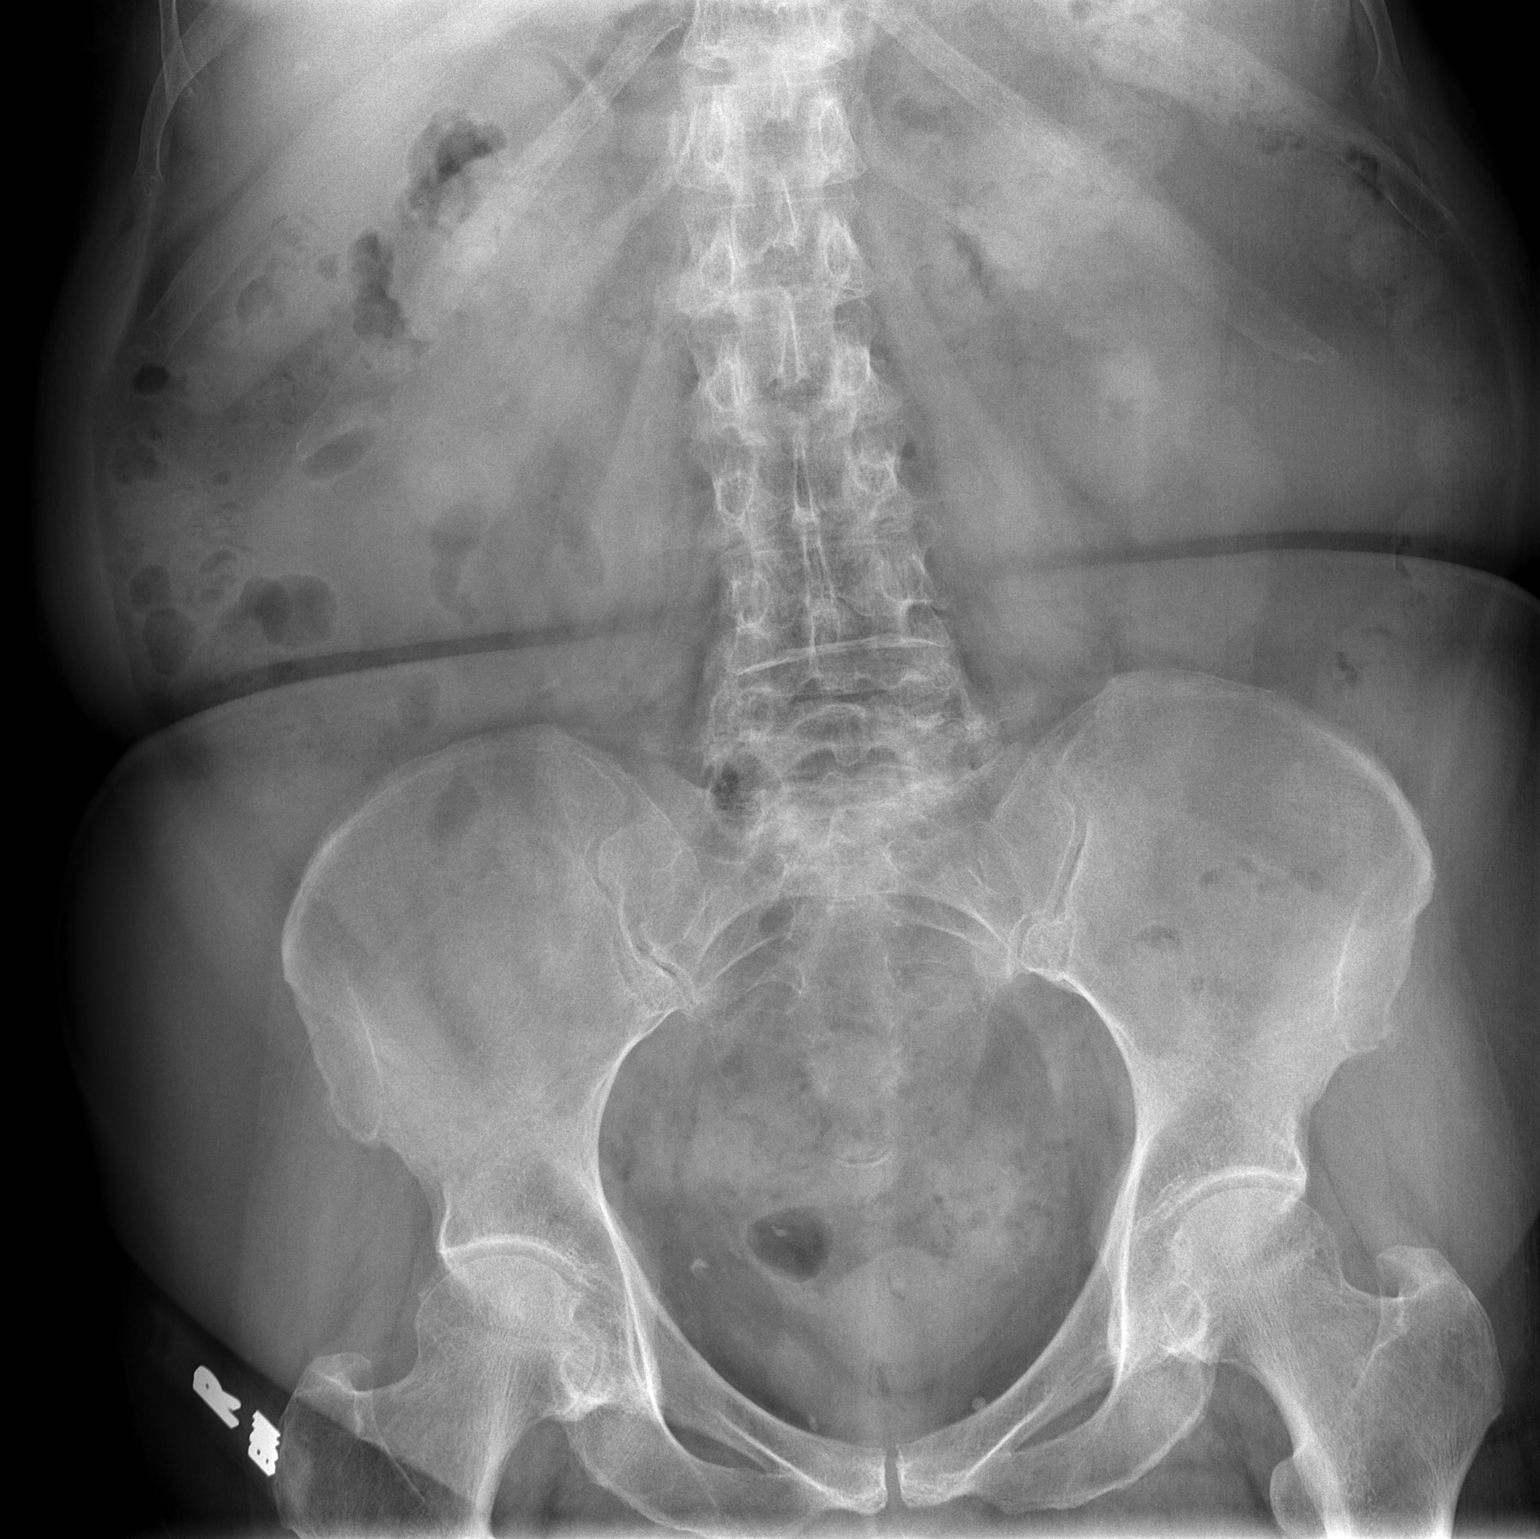

[1 of 1 positions shown; findings below may reference images not displayed]

FINDINGS: The bowel gas pattern is normal. No radio-opaque calculi or other
significant radiographic abnormality are seen.
IMPRESSION: Negative.

## 2020-04-26 ENCOUNTER — Encounter: Payer: Self-pay | Admitting: Internal Medicine

## 2020-04-26 ENCOUNTER — Other Ambulatory Visit: Payer: Self-pay

## 2020-04-26 ENCOUNTER — Ambulatory Visit: Payer: BC Managed Care – PPO | Admitting: Internal Medicine

## 2020-04-26 VITALS — BP 110/68 | HR 70 | Temp 97.5°F | Wt 161.4 lb

## 2020-04-26 DIAGNOSIS — E785 Hyperlipidemia, unspecified: Secondary | ICD-10-CM

## 2020-04-26 DIAGNOSIS — K219 Gastro-esophageal reflux disease without esophagitis: Secondary | ICD-10-CM

## 2020-04-26 DIAGNOSIS — Z23 Encounter for immunization: Secondary | ICD-10-CM | POA: Diagnosis not present

## 2020-04-26 DIAGNOSIS — E119 Type 2 diabetes mellitus without complications: Secondary | ICD-10-CM | POA: Diagnosis not present

## 2020-04-26 DIAGNOSIS — E1169 Type 2 diabetes mellitus with other specified complication: Secondary | ICD-10-CM | POA: Diagnosis not present

## 2020-04-26 LAB — POCT GLYCOSYLATED HEMOGLOBIN (HGB A1C): Hemoglobin A1C: 6.4 % — AB (ref 4.0–5.6)

## 2020-04-26 NOTE — Patient Instructions (Signed)
-  Nice seeing you today!!  -Lab work today; will notify you once results are available.  -Flu vaccine today.  -Schedule follow up in 3-4 months.

## 2020-04-26 NOTE — Addendum Note (Signed)
Addended by: Lerry Liner on: 04/26/2020 08:08 AM   Modules accepted: Orders

## 2020-04-26 NOTE — Addendum Note (Signed)
Addended by: Lerry Liner on: 04/26/2020 08:20 AM   Modules accepted: Orders

## 2020-04-26 NOTE — Progress Notes (Signed)
Established Patient Office Visit     This visit occurred during the SARS-CoV-2 public health emergency.  Safety protocols were in place, including screening questions prior to the visit, additional usage of staff PPE, and extensive cleaning of exam room while observing appropriate contact time as indicated for disinfecting solutions.    CC/Reason for Visit: Follow-up chronic medical conditions  HPI: Kathy Herman is a 74 y.o. female who is coming in today for the above mentioned reasons. 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  She has been doing well, she continues to lose weight and has lost 4 pounds since her last visit.  She is due to have her lipids rechecked as she was started on simvastatin.  Requesting flu vaccine today.   Past Medical/Surgical History: Past Medical History:  Diagnosis Date  . Allergic rhinitis, cause unspecified 05/31/2007  . CERVICAL RADICULOPATHY, RIGHT 01/07/2010  . Diabetes mellitus without complication (HCC)    dx 2019  . GERD 07/13/2007   reports hx of reflux that was causing some dysphagia, reports now improved    . Hypercalcemia    has since stopped supplemnetal calcium and following pcp for mgt   . Hyperlipidemia     Past Surgical History:  Procedure Laterality Date  . BLEPHAROPLASTY  2016  . 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 MEDIAL PARTIAL KNEE REPLACEMENT Right 2018  . TUBAL LIGATION      Social History:  reports that she has never smoked. She has never used smokeless tobacco. She reports current alcohol use. No history on file for drug use.  Allergies: Allergies  Allergen Reactions    . Erythromycin Nausea Only    Family History:  Family History  Problem Relation Age of Onset  . Memory loss Mother   . Kidney Stones Father   . Congestive Heart Failure Father      Current Outpatient Medications:  .  acetaminophen (TYLENOL) 325 MG tablet, Take 650 mg by mouth every 6 (six) hours as needed., Disp: , Rfl:  .  FINASTERIDE PO, Take 5 mg by mouth daily. , Disp: , Rfl:  .  gabapentin (NEURONTIN) 300 MG capsule, Take 300 mg by mouth at bedtime., Disp: , Rfl:  .  metFORMIN (GLUCOPHAGE-XR) 500 MG 24 hr tablet, TAKE 2 TABLETS(1000 MG) BY MOUTH DAILY WITH BREAKFAST, Disp: 180 tablet, Rfl: 0 .  omeprazole (PRILOSEC) 40 MG capsule, Take 1 capsule (40 mg total) by mouth in the morning., Disp: 30 capsule, Rfl: 3 .  pantoprazole (PROTONIX) 40 MG tablet, Take 1 tablet (40 mg total) by mouth daily., Disp: 90 tablet, Rfl: 1 .  simvastatin (ZOCOR) 40 MG tablet, TAKE 1 TABLET(40 MG) BY MOUTH AT BEDTIME, Disp: 90 tablet, Rfl: 0  Review of Systems:  Constitutional: Denies fever, chills, diaphoresis, appetite change and fatigue.  HEENT: Denies photophobia, eye pain, redness, hearing loss, ear pain, congestion, sore throat, rhinorrhea, sneezing, mouth sores, trouble swallowing, neck pain, neck stiffness and tinnitus.   Respiratory: Denies SOB, DOE, cough, chest tightness,  and wheezing.   Cardiovascular: Denies chest pain, palpitations and leg swelling.  Gastrointestinal: Denies nausea, vomiting, abdominal pain, diarrhea, constipation, blood in stool and abdominal distention.  Genitourinary: Denies dysuria, urgency, frequency, hematuria, flank pain and difficulty urinating.  Endocrine: Denies: hot or cold intolerance, sweats, changes in hair or nails, polyuria, polydipsia. Musculoskeletal: Denies myalgias, back pain, joint swelling, arthralgias and gait problem.  Skin: Denies pallor, rash and wound.  Neurological: Denies dizziness, seizures, syncope, weakness, light-headedness, numbness and  headaches.  Hematological: Denies adenopathy. Easy bruising, personal or family bleeding history  Psychiatric/Behavioral: Denies suicidal ideation, mood changes, confusion, nervousness, sleep disturbance and agitation    Physical Exam: Vitals:   04/26/20 0741  BP: 110/68  Pulse: 70  Temp: (!) 97.5 F (36.4 C)  TempSrc: Oral  SpO2: 95%  Weight: 161 lb 6.4 oz (73.2 kg)    Body mass index is 27.7 kg/m.   Constitutional: NAD, calm, comfortable Eyes: PERRL, lids and conjunctivae normal ENMT: Mucous membranes are moist.  Respiratory: clear to auscultation bilaterally, no wheezing, no crackles. Normal respiratory effort. No accessory muscle use.  Cardiovascular: Regular rate and rhythm, no murmurs / rubs / gallops. No extremity edema.  Neurologic: Grossly intact and nonfocal Psychiatric: Normal judgment and insight. Alert and oriented x 3. Normal mood.    Impression and Plan:  Controlled type 2 diabetes mellitus without complication, without long-term current use of insulin (HCC)  -A1c is well controlled today at 6.4, she has been congratulated on her weight loss. -Microalbumin today.,  Continue Metformin.  Hyperlipidemia associated with type 2 diabetes mellitus (HCC)  - Plan: Lipid panel -Goal LDL is less than 70, was 112 in January.  Gastroesophageal reflux disease without esophagitis -Well-controlled on daily PPI therapy.  Need for influenza vaccination -Flu vaccine administered today.    Patient Instructions  -Nice seeing you today!!  -Lab work today; will notify you once results are available.  -Flu vaccine today.  -Schedule follow up in 3-4 months.     [name], MD [facility] at [facility]

## 2020-04-26 NOTE — Addendum Note (Signed)
Addended by: Kern Reap B on: 04/26/2020 11:22 AM   Modules accepted: Orders

## 2020-04-27 LAB — MICROALBUMIN / CREATININE URINE RATIO
Creatinine, Urine: 93 mg/dL (ref 20–275)
Microalb Creat Ratio: 8 mcg/mg creat (ref <30)
Microalb, Ur: 0.7 mg/dL

## 2020-06-18 ENCOUNTER — Ambulatory Visit: Payer: BC Managed Care – PPO | Admitting: Internal Medicine

## 2020-06-18 ENCOUNTER — Encounter: Payer: Self-pay | Admitting: Internal Medicine

## 2020-06-18 ENCOUNTER — Other Ambulatory Visit: Payer: Self-pay

## 2020-06-18 VITALS — BP 122/74 | HR 71 | Ht 64.0 in | Wt 163.5 lb

## 2020-06-18 DIAGNOSIS — E210 Primary hyperparathyroidism: Secondary | ICD-10-CM

## 2020-06-18 LAB — BASIC METABOLIC PANEL
BUN: 20 mg/dL (ref 6–23)
CO2: 28 mEq/L (ref 19–32)
Calcium: 10.5 mg/dL (ref 8.4–10.5)
Chloride: 104 mEq/L (ref 96–112)
Creatinine, Ser: 0.71 mg/dL (ref 0.40–1.20)
GFR: 83.87 mL/min (ref >60.00)
Glucose, Bld: 102 mg/dL — ABNORMAL HIGH (ref 70–99)
Potassium: 4.3 mEq/L (ref 3.5–5.1)
Sodium: 139 mEq/L (ref 135–145)

## 2020-06-18 LAB — ALBUMIN: Albumin: 4.2 g/dL (ref 3.5–5.2)

## 2020-06-18 LAB — VITAMIN D 25 HYDROXY (VIT D DEFICIENCY, FRACTURES): VITD: 36.02 ng/mL (ref 30.00–100.00)

## 2020-06-18 NOTE — Progress Notes (Signed)
Name: GINIA RUDELL  MRN/ DOB: [id number], October 02, 1945    Age/ Sex: 74 y.o., female     PCP: Isaac Bliss, Rayford Halsted, MD   Reason for Endocrinology Evaluation: Hypercalcemia      Initial Endocrinology Clinic Visit: 10/26/2018    PATIENT IDENTIFIER:Ms. KAMEREN BAADE is a 74 y.o., female with a past medical history of T2DM, Dyslipidemia and DJD. She has followed with [facility] since 10/26/2018  for consultative assistance with management of her hypercalcemia.     HISTORICAL SUMMARY: The patient was first diagnosed with hypercalcemia in 08/2018.   Ca/Cr ratio is 0.024 which is consistent with Primary hyperparathyroidism 02/2019 at 288 mg, repeat showed reduction of calcium at 176 mg/dL.  No evidence of renal calculi on KUB 02/2019 DXA shows osteopenia 08/2017   SUBJECTIVE:    Today (06/18/2020):  Ms. Foor is here for follow up on hypercalcemia  Denies local neck swelling Denies polyuria, renal calculi, polydipsia Denies constipation  Encouraged to take OTC Vitamin D3 1000 iu daily Eating 2-3 servings of Ca+ daily. On Octavia diet. Avoids OTC Ca+ supplements     HISTORY:  Past Medical History:  Past Medical History:  Diagnosis Date  . Allergic rhinitis, cause unspecified 05/31/2007  . CERVICAL RADICULOPATHY, RIGHT 01/07/2010  . Diabetes mellitus without complication ([place])    dx 2019  . GERD 07/13/2007   reports hx of reflux that was causing some dysphagia, reports now improved    . Hypercalcemia    has since stopped supplemnetal calcium and following pcp for mgt   . Hyperlipidemia     Past Surgical History:  Past Surgical History:  Procedure Laterality Date  . BLEPHAROPLASTY  2016  . 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 12/22/2018   Procedure: Revision right knee uni to total knee arthroplasty;  Surgeon: Gaynelle Arabian, MD;  Location: [facility];  Service: Orthopedics;  Laterality: Right;  124mn  . MEDIAL PARTIAL KNEE REPLACEMENT Right 2018  .  TUBAL LIGATION       Social History:  reports that she has never smoked. She has never used smokeless tobacco. She reports current alcohol use. No history on file for drug use. Family History:  Family History  Problem Relation Age of Onset  . Memory loss Mother   . Kidney Stones Father   . Congestive Heart Failure Father       HOME MEDICATIONS: Allergies as of 06/18/2020      Reactions   Erythromycin Nausea Only      Medication List       Accurate as of June 18, 2020  8:08 AM. If you have any questions, ask your nurse or doctor.        acetaminophen 325 MG tablet Commonly known as: TYLENOL Take 650 mg by mouth every 6 (six) hours as needed.   FINASTERIDE PO Take 5 mg by mouth daily.   gabapentin 300 MG capsule Commonly known as: NEURONTIN Take 300 mg by mouth at bedtime.   metFORMIN 500 MG 24 hr tablet Commonly known as: GLUCOPHAGE-XR TAKE 2 TABLETS(1000 MG) BY MOUTH DAILY WITH BREAKFAST   omeprazole 40 MG capsule Commonly known as: PRILOSEC Take 1 capsule (40 mg total) by mouth in the morning.   pantoprazole 40 MG tablet Commonly known as: PROTONIX Take 1 tablet (40 mg total) by mouth daily.   simvastatin 40 MG tablet Commonly known as: ZOCOR TAKE 1 TABLET(40 MG) BY MOUTH AT BEDTIME         OBJECTIVE:  PHYSICAL EXAM: VS: BP 122/74   Pulse 71   Ht 5' 4"  (1.626 m)   Wt 163 lb 8 oz (74.2 kg)   SpO2 98%   BMI 28.06 kg/m    EXAM: General: Pt appears well and is in NAD  Lungs: Clear with good BS bilat with no rales, rhonchi, or wheezes  Heart: Auscultation: RRR.  Abdomen: Soft, nontender, without masses or organomegaly palpable  Extremities:  R LE: non-pitting edema around ankle due to previous knee surgery  Mental Status: Judgment, insight: Intact Memory: Intact for recent and remote events Mood and affect: No depression, anxiety, or agitation     DATA REVIEWED:  Results for IONA, STAY (MRN [id number]) as of 06/19/2020  16:21  Ref. Range 06/18/2020 15:45  Sodium Latest Ref Range: 135 - 145 mEq/L 139  Potassium Latest Ref Range: 3.5 - 5.1 mEq/L 4.3  Chloride Latest Ref Range: 96 - 112 mEq/L 104  CO2 Latest Ref Range: 19 - 32 mEq/L 28  Glucose Latest Ref Range: 70 - 99 mg/dL 102 (H)  BUN Latest Ref Range: 6 - 23 mg/dL 20  Creatinine Latest Ref Range: 0.40 - 1.20 mg/dL 0.71  Calcium Latest Ref Range: 8.4 - 10.5 mg/dL 10.5  Albumin Latest Ref Range: 3.5 - 5.2 g/dL 4.2  GFR Latest Ref Range: >60.00 mL/min 83.87  VITD Latest Ref Range: 30.00 - 100.00 ng/mL 36.02  PTH, Intact Latest Ref Range: 14 - 64 pg/mL 36     ASSESSMENT / PLAN / RECOMMENDATIONS:   1. Primary Hyperparathyroidism:   - Pt asymptomatic - Pt has not met criteria for surgical intervention thus far as below - Repeat labs shows normal Ca, PTH and Vit D as well as renal function      Maintain hydration  Avoid OTC calcium supplements  Take OTC Vitamin D3 1000 iu daily   Maintain 2-3 servings of calcium a day       F/U in 1 yr   Signed electronically by:  Mack Guise, MD  [facility]  [address], [place] [address] Phone: [phone] FAX: [phone]     CC: Isaac Bliss, Rayford Halsted, MD [place] [address] Phone: [phone]  Fax: [phone]   Return to Endocrinology clinic as below: Future Appointments  Date Time Provider [place]  06/18/2020  3:20 PM Shamleffer, Melanie Crazier, MD [facility] None  08/01/2020  8:30 AM Isaac Bliss, Rayford Halsted, MD [facility]

## 2020-06-18 NOTE — Patient Instructions (Signed)
-   Stay hydrated  - Continue to avoid over the counter calcium supplements - Continue to consume 2-3 servings of calcium in your diet daily

## 2020-06-19 ENCOUNTER — Encounter: Payer: Self-pay | Admitting: Internal Medicine

## 2020-06-19 LAB — PARATHYROID HORMONE, INTACT (NO CA): PTH: 36 pg/mL (ref 14–64)

## 2020-06-24 ENCOUNTER — Other Ambulatory Visit: Payer: Self-pay | Admitting: Internal Medicine

## 2020-06-24 DIAGNOSIS — E1169 Type 2 diabetes mellitus with other specified complication: Secondary | ICD-10-CM

## 2020-07-26 ENCOUNTER — Ambulatory Visit: Payer: BC Managed Care – PPO | Admitting: Internal Medicine

## 2020-07-26 LAB — HM DIABETES EYE EXAM

## 2020-07-31 ENCOUNTER — Encounter: Payer: Self-pay | Admitting: Internal Medicine

## 2020-08-01 ENCOUNTER — Ambulatory Visit: Payer: BC Managed Care – PPO | Admitting: Internal Medicine

## 2020-09-04 ENCOUNTER — Ambulatory Visit: Payer: BC Managed Care – PPO | Admitting: Internal Medicine

## 2020-09-04 ENCOUNTER — Encounter: Payer: Self-pay | Admitting: Internal Medicine

## 2020-09-04 ENCOUNTER — Other Ambulatory Visit: Payer: Self-pay

## 2020-09-04 VITALS — BP 118/78 | HR 69 | Temp 97.7°F | Wt 166.9 lb

## 2020-09-04 DIAGNOSIS — K219 Gastro-esophageal reflux disease without esophagitis: Secondary | ICD-10-CM

## 2020-09-04 DIAGNOSIS — E1169 Type 2 diabetes mellitus with other specified complication: Secondary | ICD-10-CM | POA: Diagnosis not present

## 2020-09-04 DIAGNOSIS — E785 Hyperlipidemia, unspecified: Secondary | ICD-10-CM

## 2020-09-04 DIAGNOSIS — E119 Type 2 diabetes mellitus without complications: Secondary | ICD-10-CM | POA: Diagnosis not present

## 2020-09-04 DIAGNOSIS — E210 Primary hyperparathyroidism: Secondary | ICD-10-CM | POA: Diagnosis not present

## 2020-09-04 LAB — POCT GLYCOSYLATED HEMOGLOBIN (HGB A1C): Hemoglobin A1C: 6.8 % — AB (ref 4.0–5.6)

## 2020-09-04 NOTE — Progress Notes (Signed)
Established Patient Office Visit     This visit occurred during the SARS-CoV-2 public health emergency.  Safety protocols were in place, including screening questions prior to the visit, additional usage of staff PPE, and extensive cleaning of exam room while observing appropriate contact time as indicated for disinfecting solutions.    CC/Reason for Visit: Follow-up chronic conditions  HPI: Kathy Herman is a 75 y.o. female who is coming in today for the above mentioned reasons. 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  She is here today mainly to follow-up on diabetes.  She also wonders about safety of continuing to use Voltaren gel on a daily basis to her knee, she admits to dietary indiscretions over the holidays and is interested in seeing what her A1c is.  She is concerned that she has stopped losing weight.  She has a trip to [place] coming up and is wondering about altitude sickness medication.  They think they may have to cancel the trip due to her husband's illness.   Past Medical/Surgical History: Past Medical History:  Diagnosis Date  . Allergic rhinitis, cause unspecified 05/31/2007  . CERVICAL RADICULOPATHY, RIGHT 01/07/2010  . Diabetes mellitus without complication (HCC)    dx 2019  . GERD 07/13/2007   reports hx of reflux that was causing some dysphagia, reports now improved    . Hypercalcemia    has since stopped supplemnetal calcium and following pcp for mgt   . Hyperlipidemia     Past Surgical History:  Procedure Laterality Date  . BLEPHAROPLASTY  2016  . CATARACT EXTRACTION Bilateral 2021  . 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 12/22/2018   Procedure: Revision right knee uni to total knee arthroplasty;  Surgeon: Ollen Gross, MD;  Location: [facility];  Service: Orthopedics;   Laterality: Right;   . MEDIAL PARTIAL KNEE REPLACEMENT Right 2018  . TUBAL LIGATION      Social History:  reports that she has never smoked. She has never used smokeless tobacco. She reports current alcohol use. No history on file for drug use.  Allergies: Allergies  Allergen Reactions  . Erythromycin Nausea Only    Family History:  Family History  Problem Relation Age of Onset  . Memory loss Mother   . Kidney Stones Father   . Congestive Heart Failure Father      Current Outpatient Medications:  .  acetaminophen (TYLENOL) 325 MG tablet, Take 650 mg by mouth every 6 (six) hours as needed., Disp: , Rfl:  .  metFORMIN (GLUCOPHAGE-XR) 500 MG 24 hr tablet, TAKE 2 TABLETS(1000 MG) BY MOUTH DAILY WITH BREAKFAST, Disp: 180 tablet, Rfl: 0 .  omeprazole (PRILOSEC) 40 MG capsule, Take 1 capsule (40 mg total) by mouth in the morning. (Patient taking differently: Take 40 mg by mouth every other day.), Disp: 30 capsule, Rfl: 3 .  simvastatin (ZOCOR) 40 MG tablet, TAKE 1 TABLET(40 MG) BY MOUTH AT BEDTIME, Disp: 90 tablet, Rfl: 0 .  FINASTERIDE PO, Take 5 mg by mouth daily.  (Patient not taking: Reported on 09/04/2020), Disp: , Rfl:  .  gabapentin (NEURONTIN) 300 MG capsule, Take 300 mg by mouth at bedtime. (Patient not taking: Reported on 09/04/2020), Disp: , Rfl:  .  pantoprazole (PROTONIX) 40 MG tablet, Take 1 tablet (40 mg total) by mouth daily. (Patient not taking: Reported on 09/04/2020), Disp:  90 tablet, Rfl: 1  Review of Systems:  Constitutional: Denies fever, chills, diaphoresis, appetite change and fatigue.  HEENT: Denies photophobia, eye pain, redness, hearing loss, ear pain, congestion, sore throat, rhinorrhea, sneezing, mouth sores, trouble swallowing, neck pain, neck stiffness and tinnitus.   Respiratory: Denies SOB, DOE, cough, chest tightness,  and wheezing.   Cardiovascular: Denies chest pain, palpitations and leg swelling.  Gastrointestinal: Denies nausea, vomiting, abdominal  pain, diarrhea, constipation, blood in stool and abdominal distention.  Genitourinary: Denies dysuria, urgency, frequency, hematuria, flank pain and difficulty urinating.  Endocrine: Denies: hot or cold intolerance, sweats, changes in hair or nails, polyuria, polydipsia. Musculoskeletal: Denies myalgias. Skin: Denies pallor, rash and wound.  Neurological: Denies dizziness, seizures, syncope, weakness, light-headedness, numbness and headaches.  Hematological: Denies adenopathy. Easy bruising, personal or family bleeding history  Psychiatric/Behavioral: Denies suicidal ideation, mood changes, confusion, nervousness, sleep disturbance and agitation    Physical Exam: Vitals:   09/04/20 0842  BP: 118/78  Pulse: 69  Temp: 97.7 F (36.5 C)  TempSrc: Oral  SpO2: 98%  Weight: 166 lb 14.4 oz (75.7 kg)    Body mass index is 28.65 kg/m.   Constitutional: NAD, calm, comfortable Eyes: PERRL, lids and conjunctivae normal ENMT: Mucous membranes are moist. Respiratory: clear to auscultation bilaterally, no wheezing, no crackles. Normal respiratory effort. No accessory muscle use.  Cardiovascular: Regular rate and rhythm, no murmurs / rubs / gallops. No extremity edema. Neurologic: CN 2-12 grossly intact. Sensation intact, DTR normal. Strength 5/5 in all 4.  Psychiatric: Normal judgment and insight. Alert and oriented x 3. Normal mood.    Impression and Plan:  Controlled type 2 diabetes mellitus without complication, without long-term current use of insulin (HCC) -Well-controlled with an A1c of 6.8 today.  Gastroesophageal reflux disease without esophagitis -Well-controlled on daily PPI therapy.  Primary hyperparathyroidism (HCC) -Followed by endocrinology  Hyperlipidemia associated with type 2 diabetes mellitus (HCC) -Last LDL was 112 which is above goal.  She is on simvastatin 40 mg.  This will need to be rechecked and if required medication increased to get to goal.  Okay to use  daily Voltaren gel to knee.    [name], MD [facility] at [facility]

## 2020-09-14 ENCOUNTER — Telehealth: Payer: Self-pay | Admitting: *Deleted

## 2020-09-14 DIAGNOSIS — K219 Gastro-esophageal reflux disease without esophagitis: Secondary | ICD-10-CM

## 2020-09-14 NOTE — Telephone Encounter (Signed)
Prior Auth started for  Omeprazole Key: [id number]

## 2020-09-16 ENCOUNTER — Other Ambulatory Visit: Payer: Self-pay | Admitting: Internal Medicine

## 2020-09-16 DIAGNOSIS — E785 Hyperlipidemia, unspecified: Secondary | ICD-10-CM

## 2020-09-16 DIAGNOSIS — E1169 Type 2 diabetes mellitus with other specified complication: Secondary | ICD-10-CM

## 2020-09-20 NOTE — Telephone Encounter (Signed)
Prior auth has been denied

## 2020-09-24 NOTE — Telephone Encounter (Signed)
Did they want an alternative?

## 2020-09-25 NOTE — Telephone Encounter (Signed)
Patient will try Protonix. Patient is also traveling in March and would like to have a prescription for travel sickness.   Please advise

## 2020-09-25 NOTE — Telephone Encounter (Signed)
Ask if she would like to switch to Protonix 40 mg daily. If so, ok to send Rx. Otherwise omeprazole can be found OTC.

## 2020-09-25 NOTE — Telephone Encounter (Signed)
"  This health benefit plan does not cover the following services, supplies, drugs or charges; any drug that is therapeutically equivalent to an over- the counter drug".

## 2020-09-25 NOTE — Telephone Encounter (Signed)
If motion sickness, she can just use OTC scopolamine patches (dramamine). May make her a little somnolent. OK to send protonix Rx.

## 2020-09-26 MED ORDER — PANTOPRAZOLE SODIUM 40 MG PO TBEC: 40.0000 mg | DELAYED_RELEASE_TABLET | Freq: Every day | ORAL | 1 refills | Status: DC

## 2020-09-26 NOTE — Telephone Encounter (Signed)
For this conversation I need to know the altitude she will be traveling to and the rapidity of which she is planning on getting to that altitude.

## 2020-09-26 NOTE — Telephone Encounter (Signed)
When is her trip? Can we schedule a quick phone call to discuss? I know she mentioned this at her last visit and I have been doing some research about it that I would like to discuss.

## 2020-09-26 NOTE — Telephone Encounter (Signed)
Left detailed message on machine for patient to schedule a virtual visit or phone visit to discuss medications for her trip.

## 2020-09-26 NOTE — Telephone Encounter (Signed)
Pt is calling in needing something for altitude sickness and stated she is not talking about dramamine which she says it is only for sea sickness (motion sickness) pt would like to have a call back.  Pharm:  [facility] on [address]

## 2020-09-26 NOTE — Telephone Encounter (Signed)
Left detailed message on machine with Dr Hardie Shackleton recommendation. Rx sent.

## 2020-09-28 NOTE — Telephone Encounter (Signed)
Virtual visit scheduled.  

## 2020-09-30 ENCOUNTER — Other Ambulatory Visit: Payer: Self-pay | Admitting: Internal Medicine

## 2020-10-02 ENCOUNTER — Encounter: Payer: Self-pay | Admitting: Internal Medicine

## 2020-10-02 ENCOUNTER — Other Ambulatory Visit: Payer: Self-pay

## 2020-10-02 ENCOUNTER — Telehealth (INDEPENDENT_AMBULATORY_CARE_PROVIDER_SITE_OTHER): Payer: BC Managed Care – PPO | Admitting: Internal Medicine

## 2020-10-02 VITALS — Wt 160.0 lb

## 2020-10-02 DIAGNOSIS — Z298 Encounter for other specified prophylactic measures: Secondary | ICD-10-CM | POA: Diagnosis not present

## 2020-10-02 MED ORDER — ACETAZOLAMIDE 125 MG PO TABS: ORAL_TABLET | ORAL | 0 refills | Status: DC

## 2020-10-02 NOTE — Progress Notes (Signed)
Virtual Visit via Video Note  I connected with Kathy Herman on 10/02/20 at  3:45 PM EST by a video enabled telemedicine application and verified that I am speaking with the correct person using two identifiers.  Location patient: home Location provider: work office Persons participating in the virtual visit: patient, provider  I discussed the limitations of evaluation and management by telemedicine and the availability of in person appointments. The patient expressed understanding and agreed to proceed.   HPI: She has scheduled this visit to discuss altitude sickness prevention.  On March 16 they will travel to [place] and will be there for 2 days.  They will then be ascending to the city of [place], [place] which is at an altitude of above 11,000 feet.  Then they will  be heading to [facility] which is at 8000 feet.  She does not have a history of altitude sickness.  However since she will be ascending quickly without time for acclimatization, altitude sickness prophylaxis is indicated.   ROS: Constitutional: Denies fever, chills, diaphoresis, appetite change and fatigue.  HEENT: Denies photophobia, eye pain, redness, hearing loss, ear pain, congestion, sore throat, rhinorrhea, sneezing, mouth sores, trouble swallowing, neck pain, neck stiffness and tinnitus.   Respiratory: Denies SOB, DOE, cough, chest tightness,  and wheezing.   Cardiovascular: Denies chest pain, palpitations and leg swelling.  Gastrointestinal: Denies nausea, vomiting, abdominal pain, diarrhea, constipation, blood in stool and abdominal distention.  Genitourinary: Denies dysuria, urgency, frequency, hematuria, flank pain and difficulty urinating.  Endocrine: Denies: hot or cold intolerance, sweats, changes in hair or nails, polyuria, polydipsia. Musculoskeletal: Denies myalgias, back pain, joint swelling, arthralgias and gait problem.  Skin: Denies pallor, rash and wound.  Neurological: Denies dizziness,  seizures, syncope, weakness, light-headedness, numbness and headaches.  Hematological: Denies adenopathy. Easy bruising, personal or family bleeding history  Psychiatric/Behavioral: Denies suicidal ideation, mood changes, confusion, nervousness, sleep disturbance and agitation   Past Medical History:  Diagnosis Date  . Allergic rhinitis, cause unspecified 05/31/2007  . CERVICAL RADICULOPATHY, RIGHT 01/07/2010  . Diabetes mellitus without complication (HCC)    dx 2019  . GERD 07/13/2007   reports hx of reflux that was causing some dysphagia, reports now improved    . Hypercalcemia    has since stopped supplemnetal calcium and following pcp for mgt   . Hyperlipidemia     Past Surgical History:  Procedure Laterality Date  . BLEPHAROPLASTY  2016  . CATARACT EXTRACTION Bilateral 2021  . 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 MEDIAL PARTIAL KNEE REPLACEMENT Right 2018  . TUBAL LIGATION      Family History  Problem Relation Age of Onset  . Memory loss Mother   . Kidney Stones Father   . Congestive Heart Failure Father     SOCIAL HX:   reports that she has never smoked. She has never used smokeless tobacco. She reports current alcohol use. No history on file for drug use.   Current Outpatient Medications:  .  acetaminophen (TYLENOL) 325 MG tablet, Take 650 mg by mouth every 6 (six) hours as needed., Disp: , Rfl:  .  acetaZOLAMIDE (DIAMOX) 125 MG tablet, Take 1 tablet twice daily for 4 days starting the day before your ascent. After, may take 1 tablet at bedtime as needed., Disp: 12 tablet, Rfl: 0 .  FINASTERIDE PO, Take 5 mg by mouth daily., Disp: , Rfl:  .  gabapentin (NEURONTIN) 300 MG capsule, Take 300 mg by mouth at bedtime., Disp: , Rfl:  .  metFORMIN (GLUCOPHAGE-XR) 500 MG 24 hr tablet, TAKE 2 TABLETS(1000 MG)  BY MOUTH DAILY WITH BREAKFAST, Disp: 180 tablet, Rfl: 1 .  omeprazole (PRILOSEC) 40 MG capsule, Take 1 capsule (40 mg total) by mouth in the morning. (Patient taking differently: Take 40 mg by mouth every other day.), Disp: 30 capsule, Rfl: 3 .  pantoprazole (PROTONIX) 40 MG tablet, Take 1 tablet (40 mg total) by mouth daily., Disp: 90 tablet, Rfl: 1 .  simvastatin (ZOCOR) 40 MG tablet, TAKE 1 TABLET(40 MG) BY MOUTH AT BEDTIME, Disp: 90 tablet, Rfl: 0  EXAM:   VITALS per patient if applicable:   GENERAL: alert, oriented, appears well and in no acute distress  HEENT: atraumatic, conjunttiva clear, no obvious abnormalities on inspection of external nose and ears  NECK: normal movements of the head and neck  LUNGS: on inspection no signs of respiratory distress, breathing rate appears normal, no obvious gross increased work of breathing, gasping or wheezing  CV: no obvious cyanosis  MS: moves all visible extremities without noticeable abnormality  PSYCH/NEURO: pleasant and cooperative, no obvious depression or anxiety, speech and thought processing grossly intact  ASSESSMENT AND PLAN:   Altitude sickness prophylaxis  -She will be prescribed acetazolamide 125 mg to take twice daily for total of 4 days starting the day before she sends to [place].  Afterwards she may take 1 tablet at bedtime as deemed necessary.     I discussed the assessment and treatment plan with the patient. The patient was provided an opportunity to ask questions and all were answered. The patient agreed with the plan and demonstrated an understanding of the instructions.   The patient was advised to call back or seek an in-person evaluation if the symptoms worsen or if the condition fails to improve as anticipated.    [name], MD  [facility] at [facility]

## 2020-10-10 ENCOUNTER — Telehealth: Payer: Self-pay | Admitting: Internal Medicine

## 2020-10-10 MED ORDER — CIPROFLOXACIN HCL 500 MG PO TABS: 500.0000 mg | ORAL_TABLET | Freq: Every day | ORAL | 0 refills | Status: DC

## 2020-10-10 NOTE — Telephone Encounter (Signed)
Patient is calling and is requesting a prescription for Sipro to be sent to the pharmacy. Per patient she would like to have this medication while she is out of the country just in case, please advise. CB is [phone]

## 2020-10-10 NOTE — Telephone Encounter (Signed)
Can we find out why she wants prophylactic abx for?

## 2020-10-10 NOTE — Telephone Encounter (Signed)
Spoke with the pt and informed her medications are not sent in without a visit and offered to schedule a virtual visit.  Patient stated she will be traveling to [place] and [place] and would like to have medication on hand in case this is needed.  Stated Dr Ardyth Harps is aware of this as she was recently given medication for altitude sickness.  Message sent to PCP.

## 2020-10-10 NOTE — Telephone Encounter (Signed)
Yes, 500 mg once daily for 3 days

## 2020-10-10 NOTE — Telephone Encounter (Signed)
Patient would like to have Cirpo just in case she has travelers diarrhea.  [facility] [employer]  Okay to send?

## 2020-10-15 ENCOUNTER — Other Ambulatory Visit: Payer: BC Managed Care – PPO

## 2020-12-03 ENCOUNTER — Other Ambulatory Visit: Payer: Self-pay

## 2020-12-04 ENCOUNTER — Ambulatory Visit: Payer: BC Managed Care – PPO | Admitting: Internal Medicine

## 2020-12-04 ENCOUNTER — Encounter: Payer: Self-pay | Admitting: Internal Medicine

## 2020-12-04 VITALS — BP 110/68 | HR 72 | Temp 97.7°F | Wt 172.4 lb

## 2020-12-04 DIAGNOSIS — K219 Gastro-esophageal reflux disease without esophagitis: Secondary | ICD-10-CM

## 2020-12-04 DIAGNOSIS — E1169 Type 2 diabetes mellitus with other specified complication: Secondary | ICD-10-CM

## 2020-12-04 DIAGNOSIS — E119 Type 2 diabetes mellitus without complications: Secondary | ICD-10-CM

## 2020-12-04 DIAGNOSIS — E210 Primary hyperparathyroidism: Secondary | ICD-10-CM | POA: Diagnosis not present

## 2020-12-04 DIAGNOSIS — E785 Hyperlipidemia, unspecified: Secondary | ICD-10-CM

## 2020-12-04 LAB — POCT GLYCOSYLATED HEMOGLOBIN (HGB A1C): Hemoglobin A1C: 7 % — AB (ref 4.0–5.6)

## 2020-12-04 NOTE — Progress Notes (Signed)
Established Patient Office Visit     This visit occurred during the SARS-CoV-2 public health emergency.  Safety protocols were in place, including screening questions prior to the visit, additional usage of staff PPE, and extensive cleaning of exam room while observing appropriate contact time as indicated for disinfecting solutions.    CC/Reason for Visit: 69-month follow-up chronic medical conditions  HPI: Kathy Herman is a 75 y.o. female who is coming in today for the above mentioned reasons. 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  She is here today mainly to follow-up on diabetes.  She has no acute complaints today.  She has gained 12 pounds since her last visit.  She admits to dietary indiscretions.  She is fully compliant with medications.   Past Medical/Surgical History: Past Medical History:  Diagnosis Date  . Allergic rhinitis, cause unspecified 05/31/2007  . CERVICAL RADICULOPATHY, RIGHT 01/07/2010  . Diabetes mellitus without complication (HCC)    dx 2019  . GERD 07/13/2007   reports hx of reflux that was causing some dysphagia, reports now improved    . Hypercalcemia    has since stopped supplemnetal calcium and following pcp for mgt   . Hyperlipidemia     Past Surgical History:  Procedure Laterality Date  . BLEPHAROPLASTY  2016  . CATARACT EXTRACTION Bilateral 2021  . 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 MEDIAL PARTIAL KNEE REPLACEMENT Right 2018  . TUBAL LIGATION      Social History:  reports that she has never smoked. She has never used smokeless tobacco. She reports current alcohol use. No history on file for  drug use.  Allergies: Allergies  Allergen Reactions  . Erythromycin Nausea Only    Family History:  Family History  Problem Relation Age of Onset  . Memory loss Mother   . Kidney Stones Father   . Congestive Heart Failure Father      Current Outpatient Medications:  .  acetaminophen (TYLENOL) 325 MG tablet, Take 650 mg by mouth every 6 (six) hours as needed., Disp: , Rfl:  .  FINASTERIDE PO, Take 5 mg by mouth daily., Disp: , Rfl:  .  metFORMIN (GLUCOPHAGE-XR) 500 MG 24 hr tablet, TAKE 2 TABLETS(1000 MG) BY MOUTH DAILY WITH BREAKFAST, Disp: 180 tablet, Rfl: 1 .  pantoprazole (PROTONIX) 40 MG tablet, Take 1 tablet (40 mg total) by mouth daily., Disp: 90 tablet, Rfl: 1 .  simvastatin (ZOCOR) 40 MG tablet, TAKE 1 TABLET(40 MG) BY MOUTH AT BEDTIME, Disp: 90 tablet, Rfl: 0 .  acetaZOLAMIDE (DIAMOX) 125 MG tablet, Take 1 tablet twice daily for 4 days starting the day before your ascent. After, may take 1 tablet at bedtime as needed., Disp: 12 tablet, Rfl: 0  Review of Systems:  Constitutional: Denies fever, chills, diaphoresis, appetite change and fatigue.  HEENT: Denies photophobia, eye pain, redness, hearing loss, ear pain, congestion, sore throat, rhinorrhea, sneezing, mouth sores, trouble swallowing, neck pain, neck stiffness and tinnitus.   Respiratory: Denies SOB, DOE, cough, chest tightness,  and wheezing.   Cardiovascular: Denies chest pain, palpitations and leg swelling.  Gastrointestinal: Denies nausea, vomiting, abdominal pain, diarrhea, constipation, blood in stool and abdominal distention.  Genitourinary: Denies dysuria, urgency, frequency, hematuria, flank pain and difficulty urinating.  Endocrine: Denies: hot or cold intolerance, sweats, changes in hair or nails, polyuria, polydipsia. Musculoskeletal: Denies myalgias, back pain, joint swelling, arthralgias and gait problem.  Skin: Denies pallor, rash and wound.  Neurological: Denies dizziness, seizures, syncope, weakness,  light-headedness, numbness and headaches.  Hematological: Denies adenopathy. Easy bruising, personal or family bleeding history  Psychiatric/Behavioral: Denies suicidal ideation, mood changes, confusion, nervousness, sleep disturbance and agitation    Physical Exam: Vitals:   12/04/20 1008  BP: 110/68  Pulse: 72  Temp: 97.7 F (36.5 C)  TempSrc: Oral  SpO2: 97%  Weight: 172 lb 6.4 oz (78.2 kg)    Body mass index is 29.59 kg/m.   Constitutional: NAD, calm, comfortable Eyes: PERRL, lids and conjunctivae normal, wears corrective lenses ENMT: Mucous membranes are moist.  Respiratory: clear to auscultation bilaterally, no wheezing, no crackles. Normal respiratory effort. No accessory muscle use.  Cardiovascular: Regular rate and rhythm, no murmurs / rubs / gallops. No extremity edema.  Neurologic: Grossly intact and nonfocal Psychiatric: Normal judgment and insight. Alert and oriented x 3. Normal mood.    Impression and Plan:  Controlled type 2 diabetes mellitus without complication, without long-term current use of insulin (HCC) -A1c today is 7.  She is on metformin 1000 mg daily. -She will work on lifestyle changes, weight loss, healthy eating, return in 3 months for follow-up.  Hyperlipidemia associated with type 2 diabetes mellitus (HCC) -Goal LDL less than 70 given history of diabetes, her last LDL was 112 in 2020.  She will come back in 3 months fasting for lab work.  Gastroesophageal reflux disease without esophagitis -Well-controlled on daily pantoprazole 40 mg.  Primary hyperparathyroidism (HCC) -Followed by endocrinology.   Patient Instructions  .     [name], MD [facility] at [facility]

## 2020-12-08 ENCOUNTER — Other Ambulatory Visit: Payer: Self-pay | Admitting: Internal Medicine

## 2020-12-08 DIAGNOSIS — E785 Hyperlipidemia, unspecified: Secondary | ICD-10-CM

## 2021-02-21 LAB — HM MAMMOGRAPHY

## 2021-02-28 ENCOUNTER — Encounter: Payer: Self-pay | Admitting: Internal Medicine

## 2021-03-06 ENCOUNTER — Ambulatory Visit: Payer: BC Managed Care – PPO | Admitting: Internal Medicine

## 2021-03-08 ENCOUNTER — Other Ambulatory Visit: Payer: Self-pay | Admitting: *Deleted

## 2021-03-08 DIAGNOSIS — E1169 Type 2 diabetes mellitus with other specified complication: Secondary | ICD-10-CM

## 2021-03-08 DIAGNOSIS — E785 Hyperlipidemia, unspecified: Secondary | ICD-10-CM

## 2021-03-08 MED ORDER — METFORMIN HCL ER 500 MG PO TB24: ORAL_TABLET | ORAL | 1 refills | Status: DC

## 2021-03-08 MED ORDER — SIMVASTATIN 40 MG PO TABS: ORAL_TABLET | ORAL | 1 refills | Status: DC

## 2021-04-03 ENCOUNTER — Ambulatory Visit: Payer: BC Managed Care – PPO | Admitting: Internal Medicine

## 2021-04-19 ENCOUNTER — Telehealth: Payer: BC Managed Care – PPO | Admitting: Internal Medicine

## 2021-04-24 ENCOUNTER — Telehealth (INDEPENDENT_AMBULATORY_CARE_PROVIDER_SITE_OTHER): Payer: BC Managed Care – PPO | Admitting: Internal Medicine

## 2021-04-24 ENCOUNTER — Encounter: Payer: Self-pay | Admitting: Internal Medicine

## 2021-04-24 VITALS — Wt 160.0 lb

## 2021-04-24 DIAGNOSIS — J069 Acute upper respiratory infection, unspecified: Secondary | ICD-10-CM | POA: Diagnosis not present

## 2021-04-24 MED ORDER — BENZONATATE 100 MG PO CAPS: 100.0000 mg | ORAL_CAPSULE | Freq: Two times a day (BID) | ORAL | 0 refills | Status: DC | PRN

## 2021-04-24 NOTE — Progress Notes (Signed)
Virtual Visit via Video Note  I connected with Kathy Herman on 04/24/21 at 11:00 AM EDT by a video enabled telemedicine application and verified that I am speaking with the correct person using two identifiers.  Location patient: home Location provider: work office Persons participating in the virtual visit: patient, provider  I discussed the limitations of evaluation and management by telemedicine and the availability of in person appointments. The patient expressed understanding and agreed to proceed.   HPI: For the past 6 days she has been experiencing paroxysms of cough especially at nighttime productive of yellow sputum that are interfering with her sleep.  She has also been having a sore throat, some ear pressure but no fever or dyspnea.  She has been using over-the-counter preparations such as DayQuil, NyQuil, Zicam, Mucinex DM without significant relief.  She has taken 3 COVID tests over the past 6 days that have been negative.   ROS: Constitutional: Denies fever, chills, diaphoresis.  HEENT: Denies photophobia, eye pain, redness, hearing loss,  mouth sores, trouble swallowing, neck pain, neck stiffness and tinnitus.   Respiratory: Denies SOB, DOE, chest tightness,  and wheezing.   Cardiovascular: Denies chest pain, palpitations and leg swelling.  Gastrointestinal: Denies nausea, vomiting, abdominal pain, diarrhea, constipation, blood in stool and abdominal distention.  Genitourinary: Denies dysuria, urgency, frequency, hematuria, flank pain and difficulty urinating.  Endocrine: Denies: hot or cold intolerance, sweats, changes in hair or nails, polyuria, polydipsia. Musculoskeletal: Denies myalgias, back pain, joint swelling, arthralgias and gait problem.  Skin: Denies pallor, rash and wound.  Neurological: Denies dizziness, seizures, syncope, weakness, light-headedness, numbness and headaches.  Hematological: Denies adenopathy. Easy bruising, personal or family bleeding  history  Psychiatric/Behavioral: Denies suicidal ideation, mood changes, confusion, nervousness, sleep disturbance and agitation   Past Medical History:  Diagnosis Date   Allergic rhinitis, cause unspecified 05/31/2007   CERVICAL RADICULOPATHY, RIGHT 01/07/2010   Diabetes mellitus without complication (HCC)    dx 2019   GERD 07/13/2007   reports hx of reflux that was causing some dysphagia, reports now improved     Hypercalcemia    has since stopped supplemnetal calcium and following pcp for mgt    Hyperlipidemia     Past Surgical History:  Procedure Laterality Date   BLEPHAROPLASTY  2016   CATARACT EXTRACTION Bilateral 2021   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 She has never used smokeless tobacco. She reports current alcohol use. No history on file for drug use.   Current Outpatient Medications:    acetaminophen (TYLENOL) 325 MG tablet, Take 650 mg by mouth every 6 (six) hours as needed., Disp: , Rfl:    acetaZOLAMIDE (DIAMOX) 125 MG tablet, Take 1 tablet twice daily for 4 days starting the day before your ascent. After, may take 1 tablet at bedtime as needed., Disp: 12 tablet, Rfl: 0   benzonatate (TESSALON) 100 MG capsule, Take 1 capsule (100 mg total) by mouth 2 (two) times daily as needed for cough., Disp: 20 capsule, Rfl:  0   FINASTERIDE PO, Take 5 mg by mouth daily., Disp: , Rfl:    metFORMIN (GLUCOPHAGE-XR) 500 MG 24 hr tablet, TAKE 2 TABLETS(1000 MG) BY MOUTH DAILY WITH BREAKFAST, Disp: 180 tablet, Rfl: 1   pantoprazole (PROTONIX) 40 MG tablet, Take 1 tablet  (40 mg total) by mouth daily., Disp: 90 tablet, Rfl: 1   simvastatin (ZOCOR) 40 MG tablet, TAKE 1 TABLET(40 MG) BY MOUTH AT BEDTIME, Disp: 90 tablet, Rfl: 1  EXAM:   VITALS per patient if applicable: None reported  GENERAL: alert, oriented, appears well and in no acute distress, sounds congested  HEENT: atraumatic, conjunttiva clear, no obvious abnormalities on inspection of external nose and ears  NECK: normal movements of the head and neck  LUNGS: on inspection no signs of respiratory distress, breathing rate appears normal, no obvious gross increased work of breathing, gasping or wheezing  CV: no obvious cyanosis  MS: moves all visible extremities without noticeable abnormality  PSYCH/NEURO: pleasant and cooperative, no obvious depression or anxiety, speech and thought processing grossly intact  ASSESSMENT AND PLAN:   Viral URI with cough  - Plan: benzonatate (TESSALON) 100 MG capsule -Unlikely COVID given 3 negative COVID tests. --Given time course and symptoms, PNA, pharyngitis, ear infection are not likely, hence abx have not been prescribed. -Have advised rest, fluids, OTC antihistamines, cough suppressants and mucinex. -We will also prescribe Tessalon cough Perles. -RTC if no improvement in 10-14 days.      I discussed the assessment and treatment plan with the patient. The patient was provided an opportunity to ask questions and all were answered. The patient agreed with the plan and demonstrated an understanding of the instructions.   The patient was advised to call back or seek an in-person evaluation if the symptoms worsen or if the condition fails to improve as anticipated.    [name], MD  [facility] at [facility]

## 2021-05-02 ENCOUNTER — Telehealth: Payer: Self-pay | Admitting: Internal Medicine

## 2021-05-02 DIAGNOSIS — H9193 Unspecified hearing loss, bilateral: Secondary | ICD-10-CM

## 2021-05-02 NOTE — Telephone Encounter (Signed)
Patient wants to know if a referral to audiologist Betsy Pries can be sent to have hearing checked. Patient states this is something previously discussed with Dr.Hernandez. The referral can be sent to [phone]. Patient would like callback when referral is sent so she can make appointment.   Good callback number is [phone]   Please Advise

## 2021-05-02 NOTE — Telephone Encounter (Signed)
Referral placed.

## 2021-05-23 ENCOUNTER — Other Ambulatory Visit: Payer: Self-pay

## 2021-05-23 ENCOUNTER — Ambulatory Visit: Payer: BC Managed Care – PPO | Admitting: Internal Medicine

## 2021-05-23 ENCOUNTER — Encounter: Payer: Self-pay | Admitting: Internal Medicine

## 2021-05-23 VITALS — BP 110/68 | Temp 97.6°F | Wt 173.1 lb

## 2021-05-23 DIAGNOSIS — R052 Subacute cough: Secondary | ICD-10-CM | POA: Diagnosis not present

## 2021-05-23 DIAGNOSIS — Z23 Encounter for immunization: Secondary | ICD-10-CM | POA: Diagnosis not present

## 2021-05-23 DIAGNOSIS — E119 Type 2 diabetes mellitus without complications: Secondary | ICD-10-CM | POA: Diagnosis not present

## 2021-05-23 DIAGNOSIS — E669 Obesity, unspecified: Secondary | ICD-10-CM | POA: Diagnosis not present

## 2021-05-23 DIAGNOSIS — K219 Gastro-esophageal reflux disease without esophagitis: Secondary | ICD-10-CM

## 2021-05-23 LAB — POCT GLYCOSYLATED HEMOGLOBIN (HGB A1C): Hemoglobin A1C: 7 % — AB (ref 4.0–5.6)

## 2021-05-23 MED ORDER — TRULICITY 0.75 MG/0.5ML ~~LOC~~ SOAJ: 0.7500 mg | SUBCUTANEOUS | 2 refills | Status: AC

## 2021-05-23 NOTE — Addendum Note (Signed)
Addended by: Kern Reap B on: 05/23/2021 05:01 PM   Modules accepted: Orders

## 2021-05-23 NOTE — Progress Notes (Signed)
Established Patient Office Visit     This visit occurred during the SARS-CoV-2 public health emergency.  Safety protocols were in place, including screening questions prior to the visit, additional usage of staff PPE, and extensive cleaning of exam room while observing appropriate contact time as indicated for disinfecting solutions.    CC/Reason for Visit: Follow-up chronic conditions  HPI: Kathy Herman is a 75 y.o. female who is coming in today for the above mentioned reasons. Past Medical History is significant for: Type 2 diabetes, hyperlipidemia, GERD.  She complains of continued weight gain.  She had a cold in September and has had a persistent cough that is dry.   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 She has never used smokeless tobacco. She reports current alcohol use. No history on file for drug use.  Allergies: Allergies  Allergen Reactions   Erythromycin Nausea Only    Family History:  Family History  Problem Relation Age of Onset   Memory loss Mother    Kidney Stones Father    Congestive Heart Failure Father      Current Outpatient  Medications:    acetaminophen (TYLENOL) 325 MG tablet, Take 650 mg by mouth every 6 (six) hours as needed., Disp: , Rfl:    acetaZOLAMIDE (DIAMOX) 125 MG tablet, Take 1 tablet twice daily for 4 days starting the day before your ascent. After, may take 1 tablet at bedtime as needed., Disp: 12 tablet, Rfl: 0   benzonatate (TESSALON) 100 MG capsule, Take 1 capsule (100 mg total) by mouth 2 (two) times daily as needed for cough., Disp: 20 capsule, Rfl: 0   Dulaglutide (TRULICITY) 0.75 MG/0.5ML SOPN, Inject 0.75 mg into the skin once a week., Disp: 2 mL, Rfl: 2   FINASTERIDE PO, Take 5 mg by mouth daily., Disp: , Rfl:    metFORMIN (GLUCOPHAGE-XR) 500 MG 24 hr tablet, TAKE 2 TABLETS(1000 MG) BY MOUTH DAILY WITH BREAKFAST, Disp: 180 tablet, Rfl: 1   pantoprazole (PROTONIX) 40 MG tablet, Take 1 tablet (40 mg total) by mouth daily., Disp: 90 tablet, Rfl: 1   simvastatin (ZOCOR) 40 MG tablet, TAKE 1 TABLET(40 MG) BY MOUTH AT BEDTIME, Disp: 90 tablet, Rfl: 1  Review of Systems:  Constitutional: Denies fever, chills, diaphoresis, appetite change and fatigue.  HEENT: Denies photophobia, eye pain, redness, hearing loss, ear pain, congestion, sore throat, rhinorrhea, sneezing, mouth sores, trouble swallowing, neck pain, neck stiffness and tinnitus.   Respiratory: Denies SOB, DOE,  chest tightness,  and wheezing.   Cardiovascular: Denies chest pain, palpitations and leg swelling.  Gastrointestinal: Denies nausea, vomiting, abdominal  pain, diarrhea, constipation, blood in stool and abdominal distention.  Genitourinary: Denies dysuria, urgency, frequency, hematuria, flank pain and difficulty urinating.  Endocrine: Denies: hot or cold intolerance, sweats, changes in hair or nails, polyuria, polydipsia. Musculoskeletal: Denies myalgias, back pain, joint swelling, arthralgias and gait problem.  Skin: Denies pallor, rash and wound.  Neurological: Denies dizziness, seizures, syncope, weakness, light-headedness,  numbness and headaches.  Hematological: Denies adenopathy. Easy bruising, personal or family bleeding history  Psychiatric/Behavioral: Denies suicidal ideation, mood changes, confusion, nervousness, sleep disturbance and agitation    Physical Exam: Vitals:   05/23/21 0949  BP: 110/68  Temp: 97.6 F (36.4 C)  TempSrc: Oral  Weight: 173 lb 1.6 oz (78.5 kg)    Body mass index is 29.71 kg/m.   Constitutional: NAD, calm, comfortable Eyes: PERRL, lids and conjunctivae normal ENMT: Mucous membranes are moist.  Respiratory: clear to auscultation bilaterally, no wheezing, no crackles. Normal respiratory effort. No accessory muscle use.  Cardiovascular: Regular rate and rhythm, no murmurs / rubs / gallops. No extremity edema.  Neurologic: Grossly intact and nonfocal Psychiatric: Normal judgment and insight. Alert and oriented x 3. Normal mood.    Impression and Plan:  Controlled type 2 diabetes mellitus without complication, without long-term current use of insulin (HCC)  - Plan: Hemoglobin A1c, POCT glycosylated hemoglobin (Hb A1C), Dulaglutide (TRULICITY) 0.75 MG/0.5ML SOPN, Microalbumin / creatinine urine ratio -A1c is 7.0.  She is on metformin only.  I believe it would be beneficial to add a GLP-1 to try and push her A1c to around 6.5 in addition to helping her with her obesity. -Trulicity 0.75 mg has been prescribed today and we have done an initial injection in office today with a sample for teaching purposes.  Obesity (BMI 30-39.9) -Discussed healthy lifestyle, including increased physical activity and better food choices to promote weight loss.  Gastroesophageal reflux disease without esophagitis -Continue omeprazole, consider escalating dose if cough persists past 8 to 12 weeks.  Subacute cough -Suspect this to be a post bronchitis cough and would expect resolution in 8 to 12 weeks, make an 10 you to use antihistamines, guaifenesin and other over-the-counter cough  suppressants as needed.  Time spent: 34 minutes reviewing chart, interviewing and examining patient, administering medication and formulating plan of care.   Patient Instructions  -Nice seeing you today!!  -Start trulicity 0.75 mg weekly.  -Schedule follow up in 3 months.   [name], MD [facility]

## 2021-05-23 NOTE — Patient Instructions (Signed)
-  Nice seeing you today!!  -Start trulicity 0.75 mg weekly.  -Schedule follow up in 3 months.

## 2021-06-05 ENCOUNTER — Telehealth (INDEPENDENT_AMBULATORY_CARE_PROVIDER_SITE_OTHER): Payer: BC Managed Care – PPO | Admitting: Internal Medicine

## 2021-06-05 VITALS — Wt 165.0 lb

## 2021-06-05 DIAGNOSIS — R052 Subacute cough: Secondary | ICD-10-CM

## 2021-06-05 MED ORDER — BENZONATATE 100 MG PO CAPS: 100.0000 mg | ORAL_CAPSULE | Freq: Two times a day (BID) | ORAL | 0 refills | Status: DC | PRN

## 2021-06-05 MED ORDER — HYDROCOD POLST-CPM POLST ER 10-8 MG/5ML PO SUER: 5.0000 mL | Freq: Two times a day (BID) | ORAL | 0 refills | Status: DC | PRN

## 2021-06-05 NOTE — Progress Notes (Signed)
Virtual Visit via Telephone Note  I connected with Kathy Herman on 06/05/21 at  4:00 PM EDT by telephone and verified that I am speaking with the correct person using two identifiers.   I discussed the limitations, risks, security and privacy concerns of performing an evaluation and management service by telephone and the availability of in person appointments. I also discussed with the patient that there may be a patient responsible charge related to this service. The patient expressed understanding and agreed to proceed.  Location patient: home Location provider: work office Participants present for the call: patient, provider Patient did not have a visit in the prior 7 days to address this/these issue(s).   History of Present Illness:  She continues to have a dry cough.  I last saw her 2 weeks ago and at that time she had mention that a month prior she had had an acute bronchitis that has left her with a significant cough.  At that point with a normal lung auscultation it was determined that she likely had a post bronchitic cough, was prescribed Tessalon Perles and to be patient as the cough typically will resolve after 12 weeks.  She continues to cough, it is causing headache, chest and abdominal muscle soreness.  She has been incontinent of urine and stool because of coughing.  She is unable to sleep at night due to coughing spasms.   Observations/Objective: Patient sounds cheerful and well on the phone. I do not appreciate any increased work of breathing. Speech and thought processing are grossly intact. Patient reported vitals: None reported   Current Outpatient Medications:    acetaminophen (TYLENOL) 325 MG tablet, Take 650 mg by mouth every 6 (six) hours as needed., Disp: , Rfl:    acetaZOLAMIDE (DIAMOX) 125 MG tablet, Take 1 tablet twice daily for 4 days starting the day before your ascent. After, may take 1 tablet at bedtime as needed., Disp: 12 tablet, Rfl: 0    benzonatate (TESSALON) 100 MG capsule, Take 1 capsule (100 mg total) by mouth 2 (two) times daily as needed for cough., Disp: 20 capsule, Rfl: 0   chlorpheniramine-HYDROcodone (TUSSIONEX PENNKINETIC ER) 10-8 MG/5ML SUER, Take 5 mLs by mouth every 12 (twelve) hours as needed for cough., Disp: 70 mL, Rfl: 0   Dulaglutide (TRULICITY) 0.75 MG/0.5ML SOPN, Inject 0.75 mg into the skin once a week., Disp: 2 mL, Rfl: 2   FINASTERIDE PO, Take 5 mg by mouth daily., Disp: , Rfl:    metFORMIN (GLUCOPHAGE-XR) 500 MG 24 hr tablet, TAKE 2 TABLETS(1000 MG) BY MOUTH DAILY WITH BREAKFAST, Disp: 180 tablet, Rfl: 1   pantoprazole (PROTONIX) 40 MG tablet, Take 1 tablet (40 mg total) by mouth daily., Disp: 90 tablet, Rfl: 1   simvastatin (ZOCOR) 40 MG tablet, TAKE 1 TABLET(40 MG) BY MOUTH AT BEDTIME, Disp: 90 tablet, Rfl: 1  Review of Systems:  Constitutional: Denies fever, chills, diaphoresis, appetite change and fatigue.  HEENT: Denies photophobia, eye pain, redness, hearing loss, ear pain, congestion, sore throat, rhinorrhea, sneezing, mouth sores, trouble swallowing, neck pain, neck stiffness and tinnitus.   Respiratory: Denies SOB, DOE,chest tightness,  and wheezing.   Cardiovascular: Denies chest pain, palpitations and leg swelling.  Gastrointestinal: Denies nausea, vomiting, abdominal pain, diarrhea, constipation, blood in stool and abdominal distention.  Genitourinary: Denies dysuria, urgency, frequency, hematuria, flank pain and difficulty urinating.  Endocrine: Denies: hot or cold intolerance, sweats, changes in hair or nails, polyuria, polydipsia. Musculoskeletal: Denies myalgias, back pain, joint swelling,  arthralgias and gait problem.  Skin: Denies pallor, rash and wound.  Neurological: Denies dizziness, seizures, syncope, weakness, light-headedness, numbness and headaches.  Hematological: Denies adenopathy. Easy bruising, personal or family bleeding history  Psychiatric/Behavioral: Denies suicidal  ideation, mood changes, confusion, nervousness, sleep disturbance and agitation   Assessment and Plan:  Subacute cough  - Plan: chlorpheniramine-HYDROcodone (TUSSIONEX PENNKINETIC ER) 10-8 MG/5ML SUER, benzonatate (TESSALON) 100 MG capsule -I still think this is a post URI cough. -I will prescribe her some Tussionex syrup to use at nighttime and some additional Tessalon Perles to use during the daytime. -She is still well within the 12-week timeframe that is consistent with a post bronchitic cough, recent normal lung auscultation is also reassuring.    I discussed the assessment and treatment plan with the patient. The patient was provided an opportunity to ask questions and all were answered. The patient agreed with the plan and demonstrated an understanding of the instructions.   The patient was advised to call back or seek an in-person evaluation if the symptoms worsen or if the condition fails to improve as anticipated.  I provided 14 minutes of non-face-to-face time during this encounter.   [name], MD  Primary Care at [facility]

## 2021-06-11 LAB — COLOGUARD: COLOGUARD: NEGATIVE

## 2021-06-16 ENCOUNTER — Other Ambulatory Visit: Payer: Self-pay | Admitting: Internal Medicine

## 2021-06-16 DIAGNOSIS — K219 Gastro-esophageal reflux disease without esophagitis: Secondary | ICD-10-CM

## 2021-06-19 ENCOUNTER — Ambulatory Visit: Payer: BC Managed Care – PPO | Admitting: Internal Medicine

## 2021-06-20 ENCOUNTER — Ambulatory Visit: Payer: BC Managed Care – PPO | Admitting: Internal Medicine

## 2021-06-20 ENCOUNTER — Other Ambulatory Visit: Payer: Self-pay

## 2021-06-20 ENCOUNTER — Encounter: Payer: Self-pay | Admitting: Internal Medicine

## 2021-06-20 VITALS — BP 122/80 | HR 86 | Ht 64.0 in | Wt 169.8 lb

## 2021-06-20 DIAGNOSIS — E210 Primary hyperparathyroidism: Secondary | ICD-10-CM

## 2021-06-20 LAB — BASIC METABOLIC PANEL
BUN: 15 mg/dL (ref 6–23)
CO2: 26 mEq/L (ref 19–32)
Calcium: 10.7 mg/dL — ABNORMAL HIGH (ref 8.4–10.5)
Chloride: 109 mEq/L (ref 96–112)
Creatinine, Ser: 0.67 mg/dL (ref 0.40–1.20)
GFR: 85.61 mL/min (ref >60.00)
Glucose, Bld: 126 mg/dL — ABNORMAL HIGH (ref 70–99)
Potassium: 3.8 mEq/L (ref 3.5–5.1)
Sodium: 139 mEq/L (ref 135–145)

## 2021-06-20 LAB — VITAMIN D 25 HYDROXY (VIT D DEFICIENCY, FRACTURES): VITD: 33.33 ng/mL (ref 30.00–100.00)

## 2021-06-20 LAB — ALBUMIN: Albumin: 4.2 g/dL (ref 3.5–5.2)

## 2021-06-20 NOTE — Patient Instructions (Signed)
-   Stay hydrated  - Continue to avoid over the counter calcium supplements - Continue to consume 2-3 servings of calcium in your diet daily

## 2021-06-20 NOTE — Progress Notes (Signed)
Name: Kathy Herman  MRN/ DOB: [id number], [birth year]    Age/ Sex: 75 y.o., female     PCP: Isaac Bliss, Rayford Halsted, MD   Reason for Endocrinology Evaluation: Hypercalcemia      Initial Endocrinology Clinic Visit: 10/26/2018    PATIENT IDENTIFIER: Kathy Herman is a 75 y.o., female with a past medical history of T2DM, Dyslipidemia and DJD. She has followed with [facility] since 10/26/2018  for consultative assistance with management of her hypercalcemia.   HISTORICAL SUMMARY: The patient was first diagnosed with hypercalcemia in 08/2018.   Ca/Cr ratio is 0.024 which is consistent with Primary hyperparathyroidism 02/2019 at 288 mg, repeat 24-hr urine calcium was 176 01/2020 No evidence of renal calculi on KUB 02/2019 DXA shows osteopenia 12/2019  SUBJECTIVE:   Today (06/20/2021):  Kathy Herman is here for a follow up on hypercalcemia.    She denies any polyuria, but has not sure of polydipsia  Has occasional constipation  No renal stones since her last visit to our clinic She consumes ~ 2 servings of calcium  No vitamin D     HISTORY:  Past Medical History:  Past Medical History:  Diagnosis Date   Allergic rhinitis, cause unspecified 05/31/2007   CERVICAL RADICULOPATHY, RIGHT 01/07/2010   Diabetes mellitus without complication ([place])    dx 2019   GERD 07/13/2007   reports hx of reflux that was causing some dysphagia, reports now improved     Hypercalcemia    has since stopped supplemnetal calcium and following pcp for mgt    Hyperlipidemia    Past Surgical History:  Past Surgical History:  Procedure Laterality Date   BLEPHAROPLASTY  2016   CATARACT EXTRACTION Bilateral 2021   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 She has never used smokeless tobacco. She reports current alcohol use. No history on file for drug use. Family History:  Family History  Problem Relation Age of Onset   Memory loss Mother    Kidney Stones Father    Congestive Heart Failure Father      HOME MEDICATIONS: Allergies as of 06/20/2021       Reactions   Erythromycin Nausea Only        Medication List        Accurate as of June 20, 2021  7:12 AM. If you have any questions, ask your nurse or doctor.          acetaminophen 325 MG tablet Commonly known as: TYLENOL Take 650 mg by mouth every 6 (six) hours as needed.   acetaZOLAMIDE 125 MG tablet Commonly known as: DIAMOX Take 1 tablet twice daily for 4 days starting the day before your ascent. After, may take 1 tablet at bedtime as needed.   benzonatate 100 MG capsule Commonly known as: TESSALON Take 1 capsule (100 mg total) by mouth 2 (two) times daily as needed for cough.   chlorpheniramine-HYDROcodone 10-8 MG/5ML Suer Commonly known as: Tussionex Pennkinetic ER Take 5 mLs by mouth every 12 (twelve) hours as needed for cough.   FINASTERIDE PO Take 5 mg by mouth daily.   metFORMIN 500 MG 24 hr tablet Commonly known as: GLUCOPHAGE-XR TAKE 2  TABLETS(1000 MG) BY MOUTH DAILY WITH BREAKFAST   pantoprazole 40 MG tablet Commonly known as: PROTONIX TAKE 1 TABLET(40 MG) BY MOUTH DAILY   simvastatin 40 MG tablet Commonly known as: ZOCOR TAKE 1 TABLET(40 MG) BY MOUTH AT BEDTIME   Trulicity 0.35 KK/9.3GH Sopn Generic drug: Dulaglutide Inject 0.75 mg into the skin once a week.          OBJECTIVE:   PHYSICAL EXAM: VS: BP 122/80 (BP Location: Left Arm, Patient Position: Sitting, Cuff Size: Small)   Pulse 86   Ht 5' 4"  (1.626 m)   Wt 169 lb 12.8 oz (77 kg)   SpO2 96%   BMI 29.15 kg/m    EXAM: General: Pt appears well and is in NAD  Neck: General: Supple without adenopathy. Thyroid:  Thyroid size normal.  No goiter or nodules appreciated. No thyroid bruit.  Lungs: Clear with good BS bilat with no rales, rhonchi, or wheezes  Heart: Auscultation: RRR.  Abdomen: Normoactive bowel sounds, soft, nontender, without masses or organomegaly palpable  Extremities:  BL LE: Trace right pretibial edema normal  Mental Status: Judgment, insight: Intact Orientation: Oriented to time, place, and person Mood and affect: No depression, anxiety, or agitation     DATA REVIEWED:  Latest Reference Range & Units 06/20/21 09:25  Sodium 135 - 145 mEq/L 139  Potassium 3.5 - 5.1 mEq/L 3.8  Chloride 96 - 112 mEq/L 109  CO2 19 - 32 mEq/L 26  Glucose 70 - 99 mg/dL 126 (H)  BUN 6 - 23 mg/dL 15  Creatinine 0.40 - 1.20 mg/dL 0.67  Calcium 8.4 - 10.5 mg/dL 10.7 (H)  Albumin 3.5 - 5.2 g/dL 4.2  GFR >60.00 mL/min 85.61  VITD 30.00 - 100.00 ng/mL 33.33  PTH, Intact 16 - 77 pg/mL 41    ASSESSMENT / PLAN / RECOMMENDATIONS:   Hyperparathyroidism:   - Corrected serum calcium is 10.54 mg/dL -PTH remains inappropriately normal - Pt has not met criteria for surgical intervention thus far  -Vitamin D and GFR normal   Recommendations: Encouraged hydration Avoid OTC calcium supplements Maintain 2-3 servings of calcium a day     F/U in 1 year  Signed electronically by: Mack Guise, MD  [facility]  [address] [place], [place] [address] Phone: [phone] FAX: [phone]      CC: Isaac Bliss, Rayford Halsted, MD [place] [address] Phone: [phone]  Fax: [phone]   Return to Endocrinology clinic as below: Future Appointments  Date Time Provider [place]  06/20/2021  8:30 AM Abhiraj Dozal, Melanie Crazier, MD [facility] None  08/26/2021  9:00 AM Isaac Bliss, Rayford Halsted, MD [facility]

## 2021-06-21 LAB — CALCIUM, IONIZED: Calcium, Ion: 6.13 mg/dL — ABNORMAL HIGH (ref 4.8–5.6)

## 2021-06-21 LAB — PARATHYROID HORMONE, INTACT (NO CA): PTH: 41 pg/mL (ref 16–77)

## 2021-06-29 ENCOUNTER — Encounter: Payer: Self-pay | Admitting: Internal Medicine

## 2021-07-03 ENCOUNTER — Ambulatory Visit: Payer: BC Managed Care – PPO | Admitting: Internal Medicine

## 2021-07-18 ENCOUNTER — Telehealth: Payer: Self-pay | Admitting: Internal Medicine

## 2021-07-18 NOTE — Telephone Encounter (Signed)
Patient says that she would like Dr. Ardyth Harps to send in medication for a cough that she has had for a while. Asked patient if Dr. Ardyth Harps has seen her for this before and she stated that she has had a virtual with her before about it.  She says that a medication for the night time and a pearl for the day time was sent in, and that she likes that because it seemed like it helped the first time around.  Patient can be reached at [phone].  Please advise.

## 2021-07-19 NOTE — Telephone Encounter (Signed)
Spoke with patient and an appointment scheduled for 07/23/21

## 2021-07-23 ENCOUNTER — Ambulatory Visit: Payer: BC Managed Care – PPO | Admitting: Internal Medicine

## 2021-07-23 ENCOUNTER — Encounter: Payer: Self-pay | Admitting: Internal Medicine

## 2021-07-23 ENCOUNTER — Ambulatory Visit (INDEPENDENT_AMBULATORY_CARE_PROVIDER_SITE_OTHER): Payer: BC Managed Care – PPO

## 2021-07-23 ENCOUNTER — Other Ambulatory Visit: Payer: Self-pay

## 2021-07-23 VITALS — BP 110/70 | HR 88 | Temp 97.6°F | Wt 168.5 lb

## 2021-07-23 DIAGNOSIS — R052 Subacute cough: Secondary | ICD-10-CM

## 2021-07-23 DIAGNOSIS — H6121 Impacted cerumen, right ear: Secondary | ICD-10-CM | POA: Diagnosis not present

## 2021-07-23 MED ORDER — BENZONATATE 100 MG PO CAPS: 100.0000 mg | ORAL_CAPSULE | Freq: Two times a day (BID) | ORAL | 0 refills | Status: DC | PRN

## 2021-07-23 MED ORDER — HYDROCOD POLST-CPM POLST ER 10-8 MG/5ML PO SUER: 5.0000 mL | Freq: Two times a day (BID) | ORAL | 0 refills | Status: DC | PRN

## 2021-07-23 NOTE — Progress Notes (Signed)
Established Patient Office Visit     This visit occurred during the SARS-CoV-2 public health emergency.  Safety protocols were in place, including screening questions prior to the visit, additional usage of staff PPE, and extensive cleaning of exam room while observing appropriate contact time as indicated for disinfecting solutions.    CC/Reason for Visit: Continued cough, right ear fullness  HPI: Kathy Herman is a 75 y.o. female who is coming in today for the above mentioned reasons.  She has been dealing with a cough since September.  It was treated as a URI.  At last visit she was prescribed cough medication which seem to be helpful.  Cough continues, she has not having production of green sputum.  No fever.  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 She has never used smokeless tobacco. She reports current alcohol use. No history on file for drug use.  Allergies: Allergies  Allergen Reactions   Erythromycin Nausea Only    Family History:  Family History  Problem Relation Age of Onset   Memory loss Mother    Kidney Stones Father    Congestive Heart  Failure Father      Current Outpatient Medications:    acetaminophen (TYLENOL) 325 MG tablet, Take 650 mg by mouth every 6 (six) hours as needed., Disp: , Rfl:    chlorpheniramine-HYDROcodone (TUSSIONEX PENNKINETIC ER) 10-8 MG/5ML SUER, Take 5 mLs by mouth every 12 (twelve) hours as needed for cough., Disp: 70 mL, Rfl: 0   Dulaglutide (TRULICITY) [id number] SOPN, Inject 0.75 mg into the skin once a week., Disp: 2 mL, Rfl: 2   FINASTERIDE PO, Take 5 mg by mouth daily., Disp: , Rfl:    metFORMIN (GLUCOPHAGE-XR) 500 MG 24 hr tablet, TAKE 2 TABLETS(1000 MG) BY MOUTH DAILY WITH BREAKFAST, Disp: 180 tablet, Rfl: 1   pantoprazole (PROTONIX) 40 MG tablet, TAKE 1 TABLET(40 MG) BY MOUTH DAILY, Disp: 90 tablet, Rfl: 1   simvastatin (ZOCOR) 40 MG tablet, TAKE 1 TABLET(40 MG) BY MOUTH AT BEDTIME, Disp: 90 tablet, Rfl: 1   benzonatate (TESSALON) 100 MG capsule, Take 1 capsule (100 mg total) by mouth 2 (two) times daily as needed for cough., Disp: 20 capsule, Rfl: 0  Review of Systems:  Constitutional: Denies fever, chills, diaphoresis, appetite change and fatigue.  HEENT: Denies photophobia, eye pain, redness, hearing loss, ear pain, congestion, sore throat, rhinorrhea, sneezing, mouth sores, trouble swallowing, neck pain, neck stiffness and tinnitus.   Respiratory: Denies SOB, DOE,  chest tightness,  and wheezing.   Cardiovascular: Denies chest pain, palpitations and leg swelling.  Gastrointestinal: Denies nausea,  vomiting, abdominal pain, diarrhea, constipation, blood in stool and abdominal distention.  Genitourinary: Denies dysuria, urgency, frequency, hematuria, flank pain and difficulty urinating.  Endocrine: Denies: hot or cold intolerance, sweats, changes in hair or nails, polyuria, polydipsia. Musculoskeletal: Denies myalgias, back pain, joint swelling, arthralgias and gait problem.  Skin: Denies pallor, rash and wound.  Neurological: Denies dizziness, seizures, syncope, weakness,  light-headedness, numbness and headaches.  Hematological: Denies adenopathy. Easy bruising, personal or family bleeding history  Psychiatric/Behavioral: Denies suicidal ideation, mood changes, confusion, nervousness, sleep disturbance and agitation    Physical Exam: Vitals:   07/23/21 0915  BP: 110/70  Pulse: 88  Temp: 97.6 F (36.4 C)  TempSrc: Oral  SpO2: 97%  Weight: 168 lb 8 oz (76.4 kg)    Body mass index is 28.92 kg/m.   Constitutional: NAD, calm, comfortable Eyes: PERRL, lids and conjunctivae normal ENMT: Mucous membranes are moist. Posterior pharynx clear of any exudate or lesions. Normal dentition. Tympanic membrane is pearly white, no erythema or bulging on the left, right is obstructed by cerumen. Respiratory: Left upper lung field crackles, no wheezing.   Cardiovascular: Regular rate and rhythm, no murmurs / rubs / gallops. No extremity edema. 2+ pedal pulses. No carotid bruits.  Neurologic: Grossly intact and nonfocal. Psychiatric: Normal judgment and insight. Alert and oriented x 3. Normal mood.    Impression and Plan:  Subacute cough  - Plan: DG Chest 2 View, benzonatate (TESSALON) 100 MG capsule, chlorpheniramine-HYDROcodone (TUSSIONEX PENNKINETIC ER) 10-8 MG/5ML SURE -Because of duration of symptoms and productive cough as well as crackles on left upper lung fields on auscultation, chest x-ray will be ordered today. -Refills of cough medication have been prescribed.  No antibiotics for now pending results of chest x-ray.  Hearing loss of right ear due to cerumen impaction Cerumen Desimpaction  W after patient consent was obtained, warm water was applied and gentle ear lavage performed on right ear. There were no complications and following the desimpaction the tympanic membranes were visible. Tympanic membranes are intact following the procedure. Auditory canals are normal. The patient reported relief of symptoms after removal of cerumen.   Time spent: 23  minutes reviewing chart, interviewing and examining patient and formulating plan of care.    [name], MD [place] [facility] at [facility]

## 2021-07-24 ENCOUNTER — Telehealth: Payer: Self-pay | Admitting: Internal Medicine

## 2021-07-24 NOTE — Telephone Encounter (Signed)
Results were reviewed with patient.  See lab result note.

## 2021-07-24 NOTE — Telephone Encounter (Signed)
Pt is calling and would like chest xray result 

## 2021-08-04 LAB — COLOGUARD: Cologuard: NEGATIVE

## 2021-08-07 ENCOUNTER — Telehealth: Payer: Self-pay | Admitting: Internal Medicine

## 2021-08-07 DIAGNOSIS — H9193 Unspecified hearing loss, bilateral: Secondary | ICD-10-CM

## 2021-08-07 NOTE — Telephone Encounter (Signed)
Okay to place? 

## 2021-08-07 NOTE — Telephone Encounter (Signed)
Referral done

## 2021-08-07 NOTE — Telephone Encounter (Signed)
Pt is calling and has seen audiologist and needs a referral to ENT for bilateral hearing loss. Pt would like to go to [facility] phone number [phone] address [address] in [place]

## 2021-08-23 ENCOUNTER — Other Ambulatory Visit: Payer: Self-pay | Admitting: Internal Medicine

## 2021-08-23 DIAGNOSIS — E119 Type 2 diabetes mellitus without complications: Secondary | ICD-10-CM

## 2021-08-26 ENCOUNTER — Ambulatory Visit: Payer: BC Managed Care – PPO | Admitting: Internal Medicine

## 2021-08-26 VITALS — BP 120/78 | HR 74 | Temp 97.5°F | Wt 170.0 lb

## 2021-08-26 DIAGNOSIS — E785 Hyperlipidemia, unspecified: Secondary | ICD-10-CM

## 2021-08-26 DIAGNOSIS — E119 Type 2 diabetes mellitus without complications: Secondary | ICD-10-CM | POA: Diagnosis not present

## 2021-08-26 DIAGNOSIS — E1169 Type 2 diabetes mellitus with other specified complication: Secondary | ICD-10-CM | POA: Diagnosis not present

## 2021-08-26 DIAGNOSIS — K219 Gastro-esophageal reflux disease without esophagitis: Secondary | ICD-10-CM | POA: Diagnosis not present

## 2021-08-26 DIAGNOSIS — E210 Primary hyperparathyroidism: Secondary | ICD-10-CM | POA: Diagnosis not present

## 2021-08-26 LAB — POCT GLYCOSYLATED HEMOGLOBIN (HGB A1C): Hemoglobin A1C: 6.1 % — AB (ref 4.0–5.6)

## 2021-08-26 MED ORDER — TRULICITY 0.75 MG/0.5ML ~~LOC~~ SOAJ: 0.7500 mg | SUBCUTANEOUS | 0 refills | Status: DC

## 2021-08-26 NOTE — Progress Notes (Signed)
Established Patient Office Visit     This visit occurred during the SARS-CoV-2 public health emergency.  Safety protocols were in place, including screening questions prior to the visit, additional usage of staff PPE, and extensive cleaning of exam room while observing appropriate contact time as indicated for disinfecting solutions.    CC/Reason for Visit: 36-month follow-up chronic medical conditions  HPI: Kathy Herman is a 76 y.o. female who is coming in today for the above mentioned reasons. 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  She has been seen by endocrinology for this.  At last visit dulaglutide 0.75 mg was started.  She is tolerating well.   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 She has never used smokeless tobacco. She reports current alcohol use. No history on file for drug use.  Allergies: Allergies  Allergen Reactions   Erythromycin Nausea Only    Family History:  Family History  Problem Relation Age of Onset   Memory loss Mother    Kidney  Stones Father    Congestive Heart Failure Father      Current Outpatient Medications:    acetaminophen (TYLENOL) 325 MG tablet, Take 650 mg by mouth every 6 (six) hours as needed., Disp: , Rfl:    FINASTERIDE PO, Take 5 mg by mouth daily., Disp: , Rfl:    metFORMIN (GLUCOPHAGE-XR) 500 MG 24 hr tablet, TAKE 2 TABLETS(1000 MG) BY MOUTH DAILY WITH BREAKFAST, Disp: 180 tablet, Rfl: 1   pantoprazole (PROTONIX) 40 MG tablet, TAKE 1 TABLET(40 MG) BY MOUTH DAILY, Disp: 90 tablet, Rfl: 1   simvastatin (ZOCOR) 40 MG tablet, TAKE 1 TABLET(40 MG) BY MOUTH AT BEDTIME, Disp: 90 tablet, Rfl: 1   chlorpheniramine-HYDROcodone (TUSSIONEX PENNKINETIC ER) 10-8 MG/5ML SUER, Take 5 mLs by mouth every 12 (twelve) hours as needed for cough. (Patient not taking: Reported on 08/26/2021), Disp: 70 mL, Rfl: 0   Dulaglutide (TRULICITY) [id number] [id number] SOPN, Inject 0.75 mg into the skin once a week., Disp: 6 mL, Rfl: 0  Review of Systems:  Constitutional: Denies fever, chills, diaphoresis, appetite change and fatigue.  HEENT: Denies photophobia, eye pain, redness, hearing loss, ear pain, congestion, sore throat, rhinorrhea, sneezing, mouth sores, trouble swallowing, neck pain, neck stiffness and tinnitus.   Respiratory: Denies SOB, DOE, cough, chest tightness,  and wheezing.   Cardiovascular: Denies chest pain, palpitations and leg swelling.  Gastrointestinal: Denies nausea, vomiting, abdominal pain, diarrhea, constipation, blood in stool and abdominal distention.  Genitourinary: Denies dysuria, urgency, frequency, hematuria, flank pain and difficulty urinating.  Endocrine: Denies: hot  or cold intolerance, sweats, changes in hair or nails, polyuria, polydipsia. Musculoskeletal: Denies myalgias, back pain, joint swelling, arthralgias and gait problem.  Skin: Denies pallor, rash and wound.  Neurological: Denies dizziness, seizures, syncope, weakness, light-headedness, numbness and headaches.  Hematological: Denies adenopathy.  Easy bruising, personal or family bleeding history  Psychiatric/Behavioral: Denies suicidal ideation, mood changes, confusion, nervousness, sleep disturbance and agitation    Physical Exam: Vitals:   08/26/21 1017  BP: 120/78  Pulse: 74  Temp: (!) 97.5 F (36.4 C)  TempSrc: Oral  SpO2: 96%  Weight: 170 lb (77.1 kg)    Body mass index is 29.18 kg/m.   Constitutional: NAD, calm, comfortable Eyes: PERRL, lids and conjunctivae normal ENMT: Mucous membranes are moist.  Respiratory: clear to auscultation bilaterally, no wheezing, no crackles. Normal respiratory effort. No accessory muscle use.  Cardiovascular: Regular rate and rhythm, no murmurs / rubs / gallops. No extremity edema.  Neurologic: Grossly intact and nonfocal Psychiatric: Normal judgment and insight. Alert and oriented x 3. Normal mood.    Impression and Plan:  Controlled type 2 diabetes mellitus without complication, without long-term current use of insulin (HCC)   Plan: POCT HgB A1C, Dulaglutide (TRULICITY) [id number] [id number] SOPN -Well-controlled with an A1c of 6.1 in office today, improved from 7.0 last visit  Hyperlipidemia associated with type 2 diabetes mellitus ([place]) -Check lipid panel when she returns for CPE, she is on simvastatin 40 mg daily.  Gastroesophageal reflux disease without esophagitis -Well-controlled on PPI therapy.  Primary hyperparathyroidism (HCC) Hypercalcemia -Followed by Endo  Time spent: 30 minutes reviewing chart, interviewing and examining patient and formulating plan of care.   [name], MD [facility]

## 2021-09-04 ENCOUNTER — Other Ambulatory Visit: Payer: Self-pay | Admitting: Internal Medicine

## 2021-09-04 DIAGNOSIS — E1169 Type 2 diabetes mellitus with other specified complication: Secondary | ICD-10-CM

## 2021-09-04 DIAGNOSIS — E785 Hyperlipidemia, unspecified: Secondary | ICD-10-CM

## 2021-09-11 ENCOUNTER — Ambulatory Visit (INDEPENDENT_AMBULATORY_CARE_PROVIDER_SITE_OTHER): Payer: BC Managed Care – PPO | Admitting: Internal Medicine

## 2021-09-11 ENCOUNTER — Encounter: Payer: Self-pay | Admitting: Internal Medicine

## 2021-09-11 VITALS — BP 110/70 | HR 80 | Temp 97.5°F | Ht 64.0 in | Wt 168.5 lb

## 2021-09-11 DIAGNOSIS — E1169 Type 2 diabetes mellitus with other specified complication: Secondary | ICD-10-CM | POA: Diagnosis not present

## 2021-09-11 DIAGNOSIS — Z13820 Encounter for screening for osteoporosis: Secondary | ICD-10-CM

## 2021-09-11 DIAGNOSIS — R413 Other amnesia: Secondary | ICD-10-CM

## 2021-09-11 DIAGNOSIS — E785 Hyperlipidemia, unspecified: Secondary | ICD-10-CM

## 2021-09-11 DIAGNOSIS — K219 Gastro-esophageal reflux disease without esophagitis: Secondary | ICD-10-CM

## 2021-09-11 DIAGNOSIS — E119 Type 2 diabetes mellitus without complications: Secondary | ICD-10-CM | POA: Diagnosis not present

## 2021-09-11 DIAGNOSIS — E210 Primary hyperparathyroidism: Secondary | ICD-10-CM

## 2021-09-11 DIAGNOSIS — M1711 Unilateral primary osteoarthritis, right knee: Secondary | ICD-10-CM

## 2021-09-11 DIAGNOSIS — Z0000 Encounter for general adult medical examination without abnormal findings: Secondary | ICD-10-CM | POA: Diagnosis not present

## 2021-09-11 DIAGNOSIS — Z780 Asymptomatic menopausal state: Secondary | ICD-10-CM

## 2021-09-11 LAB — CBC WITH DIFFERENTIAL/PLATELET
Basophils Absolute: 0.1 10*3/uL (ref 0.0–0.1)
Basophils Relative: 1 % (ref 0.0–3.0)
Eosinophils Absolute: 0.2 10*3/uL (ref 0.0–0.7)
Eosinophils Relative: 2.3 % (ref 0.0–5.0)
HCT: 39.9 % (ref 36.0–46.0)
Hemoglobin: 13.4 g/dL (ref 12.0–15.0)
Lymphocytes Relative: 22.8 % (ref 12.0–46.0)
Lymphs Abs: 1.6 10*3/uL (ref 0.7–4.0)
MCHC: 33.7 g/dL (ref 30.0–36.0)
MCV: 87.4 fl (ref 78.0–100.0)
Monocytes Absolute: 0.4 10*3/uL (ref 0.1–1.0)
Monocytes Relative: 6.1 % (ref 3.0–12.0)
Neutro Abs: 4.8 10*3/uL (ref 1.4–7.7)
Neutrophils Relative %: 67.8 % (ref 43.0–77.0)
Platelets: 227 10*3/uL (ref 150.0–400.0)
RBC: 4.57 Mil/uL (ref 3.87–5.11)
RDW: 13.5 % (ref 11.5–15.5)
WBC: 7.2 10*3/uL (ref 4.0–10.5)

## 2021-09-11 LAB — COMPREHENSIVE METABOLIC PANEL
ALT: 23 U/L (ref 0–35)
AST: 20 U/L (ref 0–37)
Albumin: 4.3 g/dL (ref 3.5–5.2)
Alkaline Phosphatase: 69 U/L (ref 39–117)
BUN: 22 mg/dL (ref 6–23)
CO2: 26 mEq/L (ref 19–32)
Calcium: 11.2 mg/dL — ABNORMAL HIGH (ref 8.4–10.5)
Chloride: 107 mEq/L (ref 96–112)
Creatinine, Ser: 0.74 mg/dL (ref 0.40–1.20)
GFR: 79.12 mL/min (ref >60.00)
Glucose, Bld: 123 mg/dL — ABNORMAL HIGH (ref 70–99)
Potassium: 4.4 mEq/L (ref 3.5–5.1)
Sodium: 143 mEq/L (ref 135–145)
Total Bilirubin: 0.7 mg/dL (ref 0.2–1.2)
Total Protein: 7 g/dL (ref 6.0–8.3)

## 2021-09-11 LAB — LIPID PANEL
Cholesterol: 147 mg/dL (ref 0–200)
HDL: 61.2 mg/dL (ref >39.00)
LDL Cholesterol: 74 mg/dL (ref 0–99)
NonHDL: 85.37
Total CHOL/HDL Ratio: 2
Triglycerides: 56 mg/dL (ref 0.0–149.0)
VLDL: 11.2 mg/dL (ref 0.0–40.0)

## 2021-09-11 LAB — TSH: TSH: 2.13 u[IU]/mL (ref 0.35–5.50)

## 2021-09-11 LAB — VITAMIN D 25 HYDROXY (VIT D DEFICIENCY, FRACTURES): VITD: 30.56 ng/mL (ref 30.00–100.00)

## 2021-09-11 LAB — VITAMIN B12: Vitamin B-12: 167 pg/mL — ABNORMAL LOW (ref 211–911)

## 2021-09-11 NOTE — Progress Notes (Signed)
Established Patient Office Visit     This visit occurred during the SARS-CoV-2 public health emergency.  Safety protocols were in place, including screening questions prior to the visit, additional usage of staff PPE, and extensive cleaning of exam room while observing appropriate contact time as indicated for disinfecting solutions.    CC/Reason for Visit: Annual preventive exam  HPI: Kathy Herman is a 76 y.o. female who is coming in today for the above mentioned reasons. Past Medical History is significant for: Type 2 diabetes, hypertension, hyperlipidemia, GERD, primary hyperparathyroidism with hypercalcemia followed by endocrinology.  She has some complaints about short-term memory loss such as forgetting where she puts items or forgetting if she put on deodorant that morning.  She is scheduled for her diabetic eye exam in March.  She has routine dental care.  She is being followed by ENT for hearing loss, it looks like an MRI has been ordered to rule out an acoustic neuroma.  Her GI physician ordered a Cologuard that was negative although I do not have copies of this we will request.  She follows with GYN for Pap smears.  She had a negative mammogram in July 2022.  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 She has never used smokeless tobacco. She reports current alcohol use. No history on file for drug use.  Allergies: Allergies  Allergen Reactions   Erythromycin Nausea Only    Family History:  Family History  Problem Relation Age of Onset   Memory loss Mother    Kidney Stones Father    Congestive Heart Failure Father      Current Outpatient Medications:    Dulaglutide (TRULICITY) [id number] [id number] SOPN, Inject 0.75 mg into the skin once a week., Disp: 6 mL, Rfl: 0   FINASTERIDE PO, Take 5 mg by mouth daily., Disp: , Rfl:    metFORMIN (GLUCOPHAGE-XR) 500 MG 24 hr tablet, TAKE 2 TABLETS(1000 MG) BY MOUTH DAILY WITH BREAKFAST, Disp: 180 tablet, Rfl: 1   pantoprazole (PROTONIX) 40 MG tablet, TAKE 1 TABLET(40 MG) BY MOUTH DAILY, Disp: 90 tablet, Rfl: 1   simvastatin (ZOCOR) 40 MG tablet, TAKE 1 TABLET(40 MG) BY MOUTH AT BEDTIME, Disp: 90 tablet, Rfl: 1  Review of Systems:  Constitutional: Denies fever, chills, diaphoresis, appetite change and fatigue.  HEENT: Denies photophobia, eye pain, redness, hearing loss, ear pain, congestion, sore throat, rhinorrhea, sneezing, mouth sores, trouble swallowing, neck pain, neck stiffness and tinnitus.   Respiratory: Denies SOB, DOE, cough, chest tightness,  and wheezing.   Cardiovascular: Denies chest pain, palpitations and leg swelling.  Gastrointestinal: Denies nausea, vomiting,  abdominal pain, diarrhea, constipation, blood in stool and abdominal distention.  Genitourinary: Denies dysuria, urgency, frequency, hematuria, flank pain and difficulty urinating.  Endocrine: Denies: hot or cold intolerance, sweats, changes in hair or nails, polyuria, polydipsia. Musculoskeletal: Denies myalgias, back pain, joint swelling, arthralgias and gait problem.  Skin: Denies pallor, rash and wound.  Neurological: Denies  dizziness, seizures, syncope, weakness, light-headedness, numbness and headaches.  Hematological: Denies adenopathy. Easy bruising, personal or family bleeding history  Psychiatric/Behavioral: Denies suicidal ideation, mood changes, confusion, nervousness, sleep disturbance and agitation    Physical Exam: Vitals:   09/11/21 0941  BP: 110/70  Pulse: 80  Temp: (!) 97.5 F (36.4 C)  TempSrc: Oral  SpO2: 98%  Weight: 168 lb 8 oz (76.4 kg)  Height: 5\' 4"  (1.626 m)    Body mass index is 28.92 kg/m.   Constitutional: NAD, calm, comfortable Eyes: PERRL, lids and conjunctivae normal ENMT: Mucous membranes are moist. Posterior pharynx clear of any exudate or lesions. Normal dentition. Tympanic membrane is pearly white, no erythema or bulging. Neck: normal, supple, no masses, no thyromegaly Respiratory: clear to auscultation bilaterally, no wheezing, no crackles. Normal respiratory effort. No accessory muscle use.  Cardiovascular: Regular rate and rhythm, no murmurs / rubs / gallops. No extremity edema. 2+ pedal pulses. No carotid bruits.  Abdomen: no tenderness, no masses palpated. No hepatosplenomegaly. Bowel sounds positive.  Musculoskeletal: no clubbing / cyanosis. No joint deformity upper and lower extremities. Good ROM, no contractures. Normal muscle tone.  Skin: no rashes, lesions, ulcers. No induration Neurologic: CN 2-12 grossly intact. Sensation intact, DTR normal. Strength 5/5 in all 4.  Psychiatric: Normal judgment and insight. Alert and oriented x 3. Normal mood.    Impression and Plan:  Encounter for preventive health examination -Recommend routine eye and dental care. -Immunizations: All immunizations are up-to-date -Healthy lifestyle discussed in detail. -Labs to be updated today. -Colon cancer screening: Negative Cologuard in the last few months, will obtain records -Breast cancer screening: 02/2021 -Cervical cancer screening: 2021, followed by GYN -Lung cancer  screening: Not applicable -Prostate cancer screening: Not applicable -DEXA: Ordered today  Primary hyperparathyroidism ([place]) -Follow calcium levels today, she has seen endocrinology for this.  Hyperlipidemia associated with type 2 diabetes mellitus ([place])   Plan: Lipid panel -She is on simvastatin 40 mg daily.  Controlled type 2 diabetes mellitus without complication, without long-term current use of insulin ([place]) - Plan: CBC with Differential/Platelet, Comprehensive metabolic panel, Microalbumin / creatinine urine ratio -Last A1c was well controlled at 6.1.  Gastroesophageal reflux disease without esophagitis -She is taking pantoprazole every other day.  Short-term memory loss  - Plan: VITAMIN D 25 Hydroxy (Vit-D Deficiency, Fractures), Vitamin B12, TSH -Suspect this is within the realm of normal, will rule out B12 deficiency and hypothyroidism.  If issues persist can consider referral for neurocognitive evaluation.    Patient Instructions  -Nice seeing you today!!  -Lab work today; will notify you once results are available.  -Schedule follow up in 3-4 months.      [name], MD [facility] at [facility]

## 2021-09-11 NOTE — Patient Instructions (Signed)
-  Nice seeing you today!!  -Lab work today; will notify you once results are available.  -Schedule follow up in 3-4 months. 

## 2021-09-11 NOTE — Addendum Note (Signed)
Addended by: Westley Hummer B on: 09/11/2021 10:19 AM   Modules accepted: Orders

## 2021-09-12 ENCOUNTER — Encounter: Payer: Self-pay | Admitting: Internal Medicine

## 2021-09-12 DIAGNOSIS — E538 Deficiency of other specified B group vitamins: Secondary | ICD-10-CM | POA: Insufficient documentation

## 2021-09-12 LAB — MICROALBUMIN / CREATININE URINE RATIO
Creatinine,U: 128.1 mg/dL
Microalb Creat Ratio: 0.8 mg/g (ref 0.0–30.0)
Microalb, Ur: 1 mg/dL (ref 0.0–1.9)

## 2021-09-17 ENCOUNTER — Ambulatory Visit (INDEPENDENT_AMBULATORY_CARE_PROVIDER_SITE_OTHER): Payer: BC Managed Care – PPO | Admitting: *Deleted

## 2021-09-17 DIAGNOSIS — E538 Deficiency of other specified B group vitamins: Secondary | ICD-10-CM | POA: Diagnosis not present

## 2021-09-17 MED ORDER — CYANOCOBALAMIN 1000 MCG/ML IJ SOLN
1000.0000 ug | Freq: Once | INTRAMUSCULAR | Status: AC
  Administered 2021-09-17: 1000 ug via INTRAMUSCULAR

## 2021-09-17 NOTE — Progress Notes (Signed)
Per orders of Dr. Hernandez, injection of Cyanocobalamin 1000mcg given by Vaudine Dutan A. Patient tolerated injection well. 

## 2021-09-24 ENCOUNTER — Ambulatory Visit (INDEPENDENT_AMBULATORY_CARE_PROVIDER_SITE_OTHER): Payer: BC Managed Care – PPO

## 2021-09-24 DIAGNOSIS — E538 Deficiency of other specified B group vitamins: Secondary | ICD-10-CM

## 2021-09-24 MED ORDER — CYANOCOBALAMIN 1000 MCG/ML IJ SOLN
1000.0000 ug | INTRAMUSCULAR | Status: AC
  Administered 2021-09-24: 1000 ug via INTRAMUSCULAR

## 2021-09-24 NOTE — Progress Notes (Signed)
Pt here for weekly B12 injection #2 of 4 per Dr Ardyth Harps.  B12 given IM left deltoid and pt tolerated injection well.  Next B12 injection scheduled for 10/01/21.

## 2021-10-01 ENCOUNTER — Ambulatory Visit (INDEPENDENT_AMBULATORY_CARE_PROVIDER_SITE_OTHER): Payer: BC Managed Care – PPO

## 2021-10-01 ENCOUNTER — Ambulatory Visit: Payer: BC Managed Care – PPO

## 2021-10-01 DIAGNOSIS — E538 Deficiency of other specified B group vitamins: Secondary | ICD-10-CM

## 2021-10-01 NOTE — Progress Notes (Addendum)
Pt here for monthly B12 injection per Dr Ardyth Harps  B12 given IM, and pt tolerated injection well.  Next B12 injection scheduled for 10/08/21

## 2021-10-02 LAB — HM DIABETES EYE EXAM

## 2021-10-03 ENCOUNTER — Encounter: Payer: Self-pay | Admitting: Internal Medicine

## 2021-10-03 DIAGNOSIS — E538 Deficiency of other specified B group vitamins: Secondary | ICD-10-CM

## 2021-10-03 MED ORDER — CYANOCOBALAMIN 1000 MCG/ML IJ SOLN
1000.0000 ug | Freq: Once | INTRAMUSCULAR | Status: AC
  Administered 2021-10-03: 1000 ug via INTRAMUSCULAR

## 2021-10-03 NOTE — Addendum Note (Signed)
Addended by: Elwin Mocha on: 10/03/2021 04:45 PM   Modules accepted: Orders

## 2021-10-08 ENCOUNTER — Ambulatory Visit (INDEPENDENT_AMBULATORY_CARE_PROVIDER_SITE_OTHER): Payer: BC Managed Care – PPO

## 2021-10-08 DIAGNOSIS — E538 Deficiency of other specified B group vitamins: Secondary | ICD-10-CM | POA: Diagnosis not present

## 2021-10-08 MED ORDER — CYANOCOBALAMIN 1000 MCG/ML IJ SOLN
1000.0000 ug | Freq: Once | INTRAMUSCULAR | Status: AC
  Administered 2021-10-08: 1000 ug via INTRAMUSCULAR

## 2021-10-08 NOTE — Progress Notes (Signed)
Pt here for monthly B12 injection per Dr Hernandez  B12 1000mcg given IM, and pt tolerated injection well.  Next B12 injection scheduled for next month.  

## 2021-11-05 ENCOUNTER — Ambulatory Visit: Payer: BC Managed Care – PPO | Admitting: Sports Medicine

## 2021-11-05 ENCOUNTER — Ambulatory Visit: Payer: Self-pay

## 2021-11-05 VITALS — BP 112/88 | Ht 64.0 in | Wt 160.0 lb

## 2021-11-05 DIAGNOSIS — M25562 Pain in left knee: Secondary | ICD-10-CM

## 2021-11-05 DIAGNOSIS — M1712 Unilateral primary osteoarthritis, left knee: Secondary | ICD-10-CM | POA: Diagnosis not present

## 2021-11-05 NOTE — Progress Notes (Signed)
Chief complaint left knee pain ? ?Patient is followed by Dr. Despina Hick.  She has had right knee pain and a knee replacement.  More recently she has been having problems with her left knee.  On prior x-rays she has significant medial compartment osteoarthritis.  She had a cortisone injection in January which gave her great relief.  They decided to try viscosupplementation which started on February 21 and she had subsequent injections on March 1 and March 8.  She really was not having too much knee pain after the series of injections. ? ?On March 10 she went to [place] for 3 days and did a lot of walking including steps. ?She had no pain while in the city but on 316 3 days after returning she started having severe pain in her left knee.  She thinks she had some swelling but not severe.  However it hurts to put any weight on the knee and she started walking with a crutch.  She saw Dr. Lequita Halt on March 24 and he suggested just resting as he felt like she aggravated her arthritis. ? ?The pain has persisted and so she is scheduled from their office for an MRI that would be this following week.  She came to see me to see if I had any other ideas about what may have happened with the knee. ? ?Physical exam ?Pleasant white female in no acute distress ?BP 112/88   Ht 5\' 4"  (1.626 m)   Wt 160 lb (72.6 kg)   BMI 27.46 kg/m?  ? ?The knee shows excellent flexion and extension ?She does get some pain along the medial side with rotation or with varus valgus stress ?There is no obvious effusion noted ?Palpation reveals focal tenderness along the medial tibial plateau ? ?Ultrasound of left knee ?There is no significant suprapatellar pouch effusion ?On the medial aspect of the knee she has a spur and joint space narrowing ?The medial meniscus looks irregular and calcified ?There is a small fragment that looks like it may have come off the meniscus that has migrated down the proximal tibia and there is hypoechoic swelling along  this medial border ?Lateral meniscus is unremarkable ?Patellar and quad tendons are unremarkable ? ?Impression medial compartment arthritis of the left knee along with a probable new loose body that may have come from the medial meniscus ? ?Ultrasound and interpretation by . Ellsworth Waldschmidt, MD ? ?

## 2021-11-05 NOTE — Assessment & Plan Note (Signed)
This has been documented on x-ray in the orthopedic office ?Today's ultrasound confirms the medial joint line changes ?However she has some acute swelling and a small loose body that probably looks like a fragment of meniscus that is tracking down below the joint line into the soft tissue over the tibial plateau ? ?I advised her to use icing and compression ?Okay to ride a stationary bike ?Use the cane for walking until less painful ?I suggested she continue with the MRI just to be sure she does not have a insufficiency fracture or some other change along the medial aspect of the tibia ? ?She has a follow-up visit at the orthopedic office in 3 weeks and should keep that and I will see her if needed ?

## 2021-11-07 ENCOUNTER — Ambulatory Visit (INDEPENDENT_AMBULATORY_CARE_PROVIDER_SITE_OTHER): Payer: BC Managed Care – PPO

## 2021-11-07 DIAGNOSIS — E538 Deficiency of other specified B group vitamins: Secondary | ICD-10-CM

## 2021-11-07 MED ORDER — CYANOCOBALAMIN 1000 MCG/ML IJ SOLN
1000.0000 ug | Freq: Once | INTRAMUSCULAR | Status: AC
  Administered 2021-11-07: 1000 ug via INTRAMUSCULAR

## 2021-11-07 NOTE — Progress Notes (Signed)
Per orders of Isaac Bliss, Rayford Halsted, MD, injection of B12 given in Right deltoid by Franco Collet. ?Patient tolerated injection well. ? ?Lab Results  ?Component Value Date  ? VITAMINB12 167 (L) 09/11/2021  ? ? ?  ?

## 2021-11-07 NOTE — Patient Instructions (Signed)
Health Maintenance Due  ?Topic Date Due  ? FOOT EXAM  09/03/2019  ? ? ? ? Row Labels 09/11/2021  ?  9:49 AM 08/26/2021  ? 10:11 AM 12/04/2020  ?  4:38 PM  ?Depression screen PHQ 2/9   Section Header. No data exists in this row.     ?Decreased Interest   0 0 0  ?Down, Depressed, Hopeless   0 0 1  ?PHQ - 2 Score   0 0 1  ?Altered sleeping   0  0  ?Tired, decreased energy   0  1  ?Change in appetite   0  2  ?Feeling bad or failure about yourself    0  0  ?Trouble concentrating   0  0  ?Moving slowly or fidgety/restless   0  0  ?Suicidal thoughts   0  0  ?PHQ-9 Score   0  4  ?Difficult doing work/chores   Not difficult at all    ? ? ?

## 2021-11-21 ENCOUNTER — Ambulatory Visit: Payer: BC Managed Care – PPO | Admitting: Internal Medicine

## 2021-11-21 ENCOUNTER — Encounter: Payer: Self-pay | Admitting: Internal Medicine

## 2021-11-21 VITALS — BP 120/78 | HR 73 | Temp 97.8°F | Wt 169.1 lb

## 2021-11-21 DIAGNOSIS — E119 Type 2 diabetes mellitus without complications: Secondary | ICD-10-CM | POA: Diagnosis not present

## 2021-11-21 DIAGNOSIS — E785 Hyperlipidemia, unspecified: Secondary | ICD-10-CM

## 2021-11-21 DIAGNOSIS — E1169 Type 2 diabetes mellitus with other specified complication: Secondary | ICD-10-CM | POA: Diagnosis not present

## 2021-11-21 DIAGNOSIS — E210 Primary hyperparathyroidism: Secondary | ICD-10-CM | POA: Diagnosis not present

## 2021-11-21 DIAGNOSIS — E538 Deficiency of other specified B group vitamins: Secondary | ICD-10-CM

## 2021-11-21 LAB — POCT GLYCOSYLATED HEMOGLOBIN (HGB A1C): Hemoglobin A1C: 6.1 % — AB (ref 4.0–5.6)

## 2021-11-21 MED ORDER — TRULICITY 1.5 MG/0.5ML ~~LOC~~ SOAJ: 1.5000 mg | SUBCUTANEOUS | 0 refills | Status: AC

## 2021-11-21 NOTE — Progress Notes (Signed)
? ? ? ?Established Patient Office Visit ? ? ? ? ?This visit occurred during the SARS-CoV-2 public health emergency.  Safety protocols were in place, including screening questions prior to the visit, additional usage of staff PPE, and extensive cleaning of exam room while observing appropriate contact time as indicated for disinfecting solutions.  ? ? ?CC/Reason for Visit: Follow-up chronic conditions ? ?HPI: Kathy Herman is a 76 y.o. female who is coming in today for the above mentioned reasons. Past Medical History is significant for: Hypertension, hyperlipidemia, well-controlled type 2 diabetes, hypercalcemia with primary hyperparathyroidism, GERD.  She is status post a right knee replacement.  She has had some chronic low-grade pain of her left knee that got worse after recent trip to [place] where she climbed a lot of steps and walked long distances.  She tells me she went and saw her orthopedist and sports medicine physician, an MRI was done (to which I do not have access to) Per report she has a stress fracture of her femur and a meniscus tear.  During previous labs in February her calcium was noted to be increased to 11.7.  She has not seen her endocrinologist in some time in regards to her primary hyperparathyroidism. ? ? ?Past Medical/Surgical History: ?Past Medical History:  ?Diagnosis Date  ? Allergic rhinitis, cause unspecified 05/31/2007  ? CERVICAL RADICULOPATHY, RIGHT 01/07/2010  ? Diabetes mellitus without complication (HCC)   ? dx 2019  ? GERD 07/13/2007  ? reports hx of reflux that was causing some dysphagia, reports now improved    ? Hypercalcemia   ? has since stopped supplemnetal calcium and following pcp for mgt   ? Hyperlipidemia   ? ? ?Past Surgical History:  ?Procedure Laterality Date  ? BLEPHAROPLASTY  2016  ? CATARACT EXTRACTION Bilateral 2021  ? 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 12/22/2018  ? Procedure: Revision right knee uni to total knee arthroplasty;  Surgeon: Ollen Gross, MD;   Location: [facility];  Service: Orthopedics;  Laterality: Right;   ? MEDIAL PARTIAL KNEE REPLACEMENT Right 2018  ? TUBAL LIGATION    ? ? ?Social History: ? reports that she has never smoked. She has never used smokeless tobacco. She reports current alcohol use. No history on file for drug use. ? ?Allergies: ?Allergies  ?Allergen Reactions  ? Erythromycin Nausea Only  ? ? ?Family History:  ?Family History  ?Problem Relation Age of Onset  ? Memory loss Mother   ? Kidney Stones Father   ? Congestive Heart Failure Father   ? ? ? ?Current Outpatient Medications:  ?  Dulaglutide (TRULICITY) 1.5 MG/0.5ML SOPN, Inject 1.5 mg into the skin once a week., Disp: 6 mL, Rfl: 0 ?  FINASTERIDE PO, Take 5 mg by mouth daily., Disp: , Rfl:  ?  metFORMIN (GLUCOPHAGE-XR) 500 MG 24 hr tablet, TAKE 2 TABLETS(1000 MG) BY MOUTH DAILY WITH BREAKFAST, Disp: 180 tablet, Rfl: 1 ?  pantoprazole (PROTONIX) 40 MG tablet, TAKE 1 TABLET(40 MG) BY MOUTH DAILY, Disp: 90 tablet, Rfl: 1 ?  simvastatin (ZOCOR) 40 MG tablet, TAKE 1 TABLET(40 MG) BY MOUTH AT BEDTIME, Disp: 90 tablet, Rfl: 1 ? ?Review of Systems:  ?Constitutional: Denies fever, chills, diaphoresis, appetite change and fatigue.  ?HEENT: Denies photophobia, eye pain, redness, hearing loss, ear pain, congestion, sore throat, rhinorrhea, sneezing, mouth sores, trouble swallowing, neck pain, neck stiffness and tinnitus.   ?Respiratory: Denies SOB, DOE, cough, chest tightness,  and wheezing.   ?Cardiovascular: Denies chest pain, palpitations and  leg swelling.  ?Gastrointestinal: Denies nausea, vomiting, abdominal pain, diarrhea, constipation, blood in stool and abdominal distention.  ?Genitourinary: Denies dysuria, urgency, frequency, hematuria, flank pain and difficulty urinating.  ?Endocrine: Denies: hot or cold intolerance, sweats, changes in hair or nails, polyuria, polydipsia. ?Musculoskeletal: Denies myalgias, back pain. ?Skin: Denies pallor, rash and wound.  ?Neurological: Denies  dizziness, seizures, syncope, weakness, light-headedness, numbness and headaches.  ?Hematological: Denies adenopathy. Easy bruising, personal or family bleeding history  ?Psychiatric/Behavioral: Denies suicidal ideation, mood changes, confusion, nervousness, sleep disturbance and agitation ? ? ? ?Physical Exam: ?Vitals:  ? 11/21/21 1149  ?BP: 120/78  ?Pulse: 73  ?Temp: 97.8 ?F (36.6 ?C)  ?TempSrc: Oral  ?SpO2: 98%  ?Weight: 169 lb 1.6 oz (76.7 kg)  ? ? ?Body mass index is 29.03 kg/m?. ? ? ?Constitutional: NAD, calm, comfortable, ambulating with a cane ?Eyes: PERRL, lids and conjunctivae normal ?ENMT: Mucous membranes are moist.  ?Respiratory: clear to auscultation bilaterally, no wheezing, no crackles. Normal respiratory effort. No accessory muscle use.  ?Cardiovascular: Regular rate and rhythm, no murmurs / rubs / gallops.  ?Psychiatric: Normal judgment and insight. Alert and oriented x 3. Normal mood.  ? ? ?Impression and Plan: ? ?Controlled type 2 diabetes mellitus without complication, without long-term current use of insulin (HCC)  ?- Plan: POCT glycosylated hemoglobin (Hb A1C), Dulaglutide (TRULICITY) 1.5 MG/0.5ML SOPN ?-A1c shows good control at 6.1, per request we will increase Trulicity to 1.5. ? ?Hyperlipidemia associated with type 2 diabetes mellitus (HCC) ?-LDL was close to goal at 74 in 2/23, she is on simvastatin 40 mg daily. ? ?Primary hyperparathyroidism (HCC) ?Hypercalcemia ?-With rising calcium levels and now a stress fracture of her femur, I have suggested that she revisit her endocrinologist and possibly consider a surgical referral for consideration of parathyroidectomy. ?-DEXA scan in May 2021 showed osteopenia with the highest T score of -2.2.  She will be due for repeat levels. ? ?Vitamin B12 deficiency ?-On supplementation ? ? ? ?Time spent:31 minutes reviewing chart, interviewing and examining patient and formulating plan of care. ? ? ? ? ?[name], MD ?[facility]  at [facility] ? ? ?

## 2021-11-24 ENCOUNTER — Other Ambulatory Visit: Payer: Self-pay | Admitting: Internal Medicine

## 2021-11-24 DIAGNOSIS — E119 Type 2 diabetes mellitus without complications: Secondary | ICD-10-CM

## 2021-11-25 ENCOUNTER — Ambulatory Visit: Payer: BC Managed Care – PPO | Admitting: Internal Medicine

## 2021-11-25 ENCOUNTER — Encounter: Payer: Self-pay | Admitting: Internal Medicine

## 2021-11-25 VITALS — BP 118/74 | HR 70 | Ht 64.0 in

## 2021-11-25 DIAGNOSIS — E210 Primary hyperparathyroidism: Secondary | ICD-10-CM | POA: Diagnosis not present

## 2021-11-25 LAB — BASIC METABOLIC PANEL
BUN: 19 mg/dL (ref 6–23)
CO2: 26 mEq/L (ref 19–32)
Calcium: 10.7 mg/dL — ABNORMAL HIGH (ref 8.4–10.5)
Chloride: 108 mEq/L (ref 96–112)
Creatinine, Ser: 0.65 mg/dL (ref 0.40–1.20)
GFR: 85.98 mL/min (ref >60.00)
Glucose, Bld: 96 mg/dL (ref 70–99)
Potassium: 4.3 mEq/L (ref 3.5–5.1)
Sodium: 141 mEq/L (ref 135–145)

## 2021-11-25 LAB — VITAMIN D 25 HYDROXY (VIT D DEFICIENCY, FRACTURES): VITD: 29.12 ng/mL — ABNORMAL LOW (ref 30.00–100.00)

## 2021-11-25 LAB — ALBUMIN: Albumin: 4.3 g/dL (ref 3.5–5.2)

## 2021-11-25 NOTE — Progress Notes (Signed)
? ?Name: [name]  ?MRN/ DOB: [id number], [birth year]    ?Age/ Sex: 76 y.o., female   ? ? ?PCP: Isaac Bliss, Rayford Halsted, MD   ?Reason for Endocrinology Evaluation: Hypercalcemia   ?   ?Initial Endocrinology Clinic Visit: 10/26/2018  ? ? ?PATIENT IDENTIFIER: Kathy Herman is a 76 y.o., female with a past medical history of T2DM, Dyslipidemia and DJD. She has followed with [facility] since 10/26/2018  for consultative assistance with management of her hypercalcemia.  ? ?HISTORICAL SUMMARY: The patient was first diagnosed with hypercalcemia in 08/2018.  ? ?Ca/Cr ratio is 0.024 which is consistent with Primary hyperparathyroidism 02/2019 at 288 mg, repeat 24-hr urine calcium was 176 mg  01/2020 ?No evidence of renal calculi on KUB 02/2019 ?DXA shows osteopenia 12/2019 ? ?SUBJECTIVE:  ? ?Today (11/25/2021):  Kathy Herman is here for a follow up on hypercalcemia.  ? ? ?She denies any polyuria,or  polydipsia  ?Denies constipation  ?No renal stones  ?Has a stress fracture of left femur  ?No recent falls  ?She has had multiple intra-articular injection of the left knee , was diagnosed with stress fracture of the left femur and torn meniscus through Dr. Maureen Ralphs  ?She consumes ~ 2 servings of calcium  ?No vitamin D ?Vitamin B12 monthly injection  ? ?She is schedule for DXA 5/11/ 2023 ? ? ?HISTORY:  ?Past Medical History:  ?Past Medical History:  ?Diagnosis Date  ? Allergic rhinitis, cause unspecified 05/31/2007  ? CERVICAL RADICULOPATHY, RIGHT 01/07/2010  ? Diabetes mellitus without complication ([place])   ? dx 2019  ? GERD 07/13/2007  ? reports hx of reflux that was causing some dysphagia, reports now improved    ? Hypercalcemia   ? has since stopped supplemnetal calcium and following pcp for mgt   ? Hyperlipidemia   ? ?Past Surgical History:  ?Past Surgical History:  ?Procedure Laterality Date  ? BLEPHAROPLASTY  2016  ? CATARACT EXTRACTION Bilateral 2021  ? 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 12/22/2018  ?  Procedure: Revision right knee uni to total knee arthroplasty;  Surgeon: Gaynelle Arabian, MD;  Location: [facility];  Service: Orthopedics;  Laterality: Right;  157mn  ? MEDIAL PARTIAL KNEE REPLACEMENT Right 2018  ? TUBAL LIGATION    ? ?Social History:  reports that she has never smoked. She has never used smokeless tobacco. She reports current alcohol use. No history on file for drug use. ?Family History:  ?Family History  ?Problem Relation Age of Onset  ? Memory loss Mother   ? Kidney Stones Father   ? Congestive Heart Failure Father   ? ? ? ?HOME MEDICATIONS: ?Allergies as of 11/25/2021   ? ?   Reactions  ? Erythromycin Nausea Only  ? ?  ? ?  ?Medication List  ?  ? ?  ? Accurate as of November 25, 2021  7:11 AM. If you have any questions, ask your nurse or doctor.  ?  ?  ? ?  ? ?FINASTERIDE PO ?Take 5 mg by mouth daily. ?  ?metFORMIN 500 MG 24 hr tablet ?Commonly known as: GLUCOPHAGE-XR ?TAKE 2 TABLETS(1000 MG) BY MOUTH DAILY WITH BREAKFAST ?  ?pantoprazole 40 MG tablet ?Commonly known as: PROTONIX ?TAKE 1 TABLET(40 MG) BY MOUTH DAILY ?  ?simvastatin 40 MG tablet ?Commonly known as: ZOCOR ?TAKE 1 TABLET(40 MG) BY MOUTH AT BEDTIME ?  ?Trulicity 1.5 MFY/1.0FBSopn ?Generic drug: Dulaglutide ?Inject 1.5 mg into the skin once a week. ?  ? ?  ? ? ? ? ?  OBJECTIVE:  ? ?PHYSICAL EXAM: ?VS: BP 118/74 (BP Location: Left Arm, Patient Position: Sitting, Cuff Size: Small)   Pulse 70   Ht 5' 4"  (1.626 m)   SpO2 96%   BMI 29.03 kg/m?  ? ? ? ?EXAM: ?General: Pt appears well and is in NAD  ?Neck: General: Supple without adenopathy. ?Thyroid: Thyroid size normal.  No goiter or nodules appreciated. No thyroid bruit.  ?Lungs: Clear with good BS bilat with no rales, rhonchi, or wheezes  ?Heart: Auscultation: RRR.  ?Abdomen: Normoactive bowel sounds, soft, nontender, without masses or organomegaly palpable  ?Extremities:  ?BL LE: Trace right pretibial edema normal  ?Mental Status: Judgment, insight: Intact ?Orientation: Oriented to time,  place, and person ?Mood and affect: No depression, anxiety, or agitation  ? ? ? ?DATA REVIEWED: ? ? Latest Reference Range & Units 11/25/21 09:54  ?Sodium 135 - 145 mEq/L 141  ?Potassium 3.5 - 5.1 mEq/L 4.3  ?Chloride 96 - 112 mEq/L 108  ?CO2 19 - 32 mEq/L 26  ?Glucose 70 - 99 mg/dL 96  ?BUN 6 - 23 mg/dL 19  ?Creatinine 0.40 - 1.20 mg/dL 0.65  ?Calcium 8.4 - 10.5 mg/dL 10.7 (H)  ?Calcium Ionized 4.7 - 5.5 mg/dL 5.5  ?Albumin 3.5 - 5.2 g/dL 4.3  ?GFR >60.00 mL/min 85.98  ? ? Latest Reference Range & Units 11/25/21 09:54  ?VITD 30.00 - 100.00 ng/mL 29.12 (L)  ? ? Latest Reference Range & Units 11/25/21 09:54  ?Glucose 70 - 99 mg/dL 96  ?PTH, Intact 16 - 77 pg/mL 39  ? ? ? ?ASSESSMENT / PLAN / RECOMMENDATIONS:  ? ?Hyperparathyroidism:  ? ?- Corrected serum calcium is 10.46 mg/dL, ionized calcium is at the upper limit of normal ?- PTH is normal ?- Pt has not met criteria for surgical intervention thus far , we will wait on DEXA results ?-The patient has been diagnosed with stress fractures, which are NOT generally considered fragility fractures ?-Vitamin D is low we will replenish ? ? ?Recommendations: ?Encouraged hydration ?Avoid OTC calcium supplements ?Maintain 2-3 servings of calcium a day  ?We will start OTC vitamin D3 1000 IU daily ? ? ? ?F/U in 1 year ? ?Signed electronically by: ?Abby Nena Jordan, MD ? ?[facility]  ?[facility] ?[address] ?[place], [place] [address] ?Phone: [phone] ?FAX: [phone]  ? ? ? ? ?CC: ?Isaac Bliss, Rayford Halsted, MD ?[place] ?[place] [address] ?Phone: [phone]  ?Fax: [phone] ? ? ?Return to Endocrinology clinic as below: ?Future Appointments  ?Date Time Provider [place]  ?11/25/2021  9:10 AM Ketrick Matney, Melanie Crazier, MD [facility] None  ?12/06/2021  9:00 AM [facility]  ?01/06/2022  9:00 AM [facility]  ?02/05/2022  9:00 AM [facility]  ?02/20/2022  8:30 AM Isaac Bliss, Rayford Halsted, MD [facility]  ?06/19/2022  8:30 AM Tiney Zipper, Melanie Crazier, MD [facility] None  ?  ? ?

## 2021-11-25 NOTE — Patient Instructions (Addendum)
-   Stay hydrated  ?- Continue to avoid over the counter calcium supplements ?- Continue to consume 2-3 servings of calcium in your diet daily  ? ? ? ? ? ?24-Hour Urine Collection ? ?You will be collecting your urine for a 24-hour period of time. ?Your timer starts with your first urine of the morning (For example - If you first pee at [place], your timer will start at [place]) ?Throw away your first urine of the morning ?Collect your urine every time you pee for the next 24 hours ?STOP your urine collection 24 hours after you started the collection (For example - You would stop at 9AM the day after you started) ? ?

## 2021-11-26 LAB — PARATHYROID HORMONE, INTACT (NO CA): PTH: 39 pg/mL (ref 16–77)

## 2021-11-26 LAB — CALCIUM, IONIZED: Calcium, Ion: 5.5 mg/dL (ref 4.7–5.5)

## 2021-12-02 ENCOUNTER — Other Ambulatory Visit: Payer: BC Managed Care – PPO

## 2021-12-02 DIAGNOSIS — E210 Primary hyperparathyroidism: Secondary | ICD-10-CM

## 2021-12-02 NOTE — Progress Notes (Unsigned)
Total volume 750.  Started 12-01-2021 at 6:30 am ended 12-02-2021 6:30 am. ?

## 2021-12-03 LAB — CREATININE, URINE, 24 HOUR: Creatinine, 24H Ur: 0.74 g/(24.h) (ref 0.50–2.15)

## 2021-12-03 LAB — CALCIUM, URINE, 24 HOUR: Calcium, 24H Urine: 242 mg/24 h

## 2021-12-06 ENCOUNTER — Ambulatory Visit (INDEPENDENT_AMBULATORY_CARE_PROVIDER_SITE_OTHER): Payer: BC Managed Care – PPO

## 2021-12-06 DIAGNOSIS — E538 Deficiency of other specified B group vitamins: Secondary | ICD-10-CM | POA: Diagnosis not present

## 2021-12-06 MED ORDER — CYANOCOBALAMIN 1000 MCG/ML IJ SOLN
1000.0000 ug | Freq: Once | INTRAMUSCULAR | Status: AC
  Administered 2021-12-06: 1000 ug via INTRAMUSCULAR

## 2021-12-06 NOTE — Progress Notes (Addendum)
Per orders of Philip Aspen, Limmie Patricia, MD, injection of B12 given in left  deltoid by Adelle Zachar D Rashaud Ybarbo. ?Patient tolerated injection well. ? ?Lab Results  ?Component Value Date  ? VITAMINB12 167 (L) 09/11/2021  ? ? ?  ?

## 2021-12-16 LAB — HM DEXA SCAN

## 2021-12-17 ENCOUNTER — Encounter: Payer: Self-pay | Admitting: Internal Medicine

## 2021-12-20 ENCOUNTER — Encounter: Payer: Self-pay | Admitting: Internal Medicine

## 2022-01-06 ENCOUNTER — Ambulatory Visit (INDEPENDENT_AMBULATORY_CARE_PROVIDER_SITE_OTHER): Payer: BC Managed Care – PPO

## 2022-01-06 DIAGNOSIS — E538 Deficiency of other specified B group vitamins: Secondary | ICD-10-CM | POA: Diagnosis not present

## 2022-01-06 MED ORDER — CYANOCOBALAMIN 1000 MCG/ML IJ SOLN
1000.0000 ug | INTRAMUSCULAR | Status: AC
  Administered 2022-01-06 – 2022-08-14 (×3): 1000 ug via INTRAMUSCULAR

## 2022-01-06 NOTE — Progress Notes (Signed)
Pt here for monthly B12 injection per Dr Ardyth Harps.  B12 given IM left deltoid and pt tolerated injection well.  Next B12 injection scheduled for 02/05/22.

## 2022-02-05 ENCOUNTER — Ambulatory Visit (INDEPENDENT_AMBULATORY_CARE_PROVIDER_SITE_OTHER): Payer: BC Managed Care – PPO

## 2022-02-05 DIAGNOSIS — E538 Deficiency of other specified B group vitamins: Secondary | ICD-10-CM | POA: Diagnosis not present

## 2022-02-05 NOTE — Progress Notes (Signed)
Pt here for monthly B12 injection per Dr Ardyth Harps.  B12 given IM right deltoid and pt tolerated injection well.  Pt has OV with PCP scheduled for 02/20/22.

## 2022-02-20 ENCOUNTER — Encounter: Payer: Self-pay | Admitting: Internal Medicine

## 2022-02-20 ENCOUNTER — Ambulatory Visit: Payer: BC Managed Care – PPO | Admitting: Internal Medicine

## 2022-02-20 VITALS — BP 124/84 | HR 73 | Temp 97.7°F | Wt 167.5 lb

## 2022-02-20 DIAGNOSIS — E210 Primary hyperparathyroidism: Secondary | ICD-10-CM

## 2022-02-20 DIAGNOSIS — E1169 Type 2 diabetes mellitus with other specified complication: Secondary | ICD-10-CM

## 2022-02-20 DIAGNOSIS — M1712 Unilateral primary osteoarthritis, left knee: Secondary | ICD-10-CM

## 2022-02-20 DIAGNOSIS — E119 Type 2 diabetes mellitus without complications: Secondary | ICD-10-CM

## 2022-02-20 DIAGNOSIS — K219 Gastro-esophageal reflux disease without esophagitis: Secondary | ICD-10-CM

## 2022-02-20 DIAGNOSIS — E538 Deficiency of other specified B group vitamins: Secondary | ICD-10-CM | POA: Diagnosis not present

## 2022-02-20 DIAGNOSIS — E785 Hyperlipidemia, unspecified: Secondary | ICD-10-CM

## 2022-02-20 LAB — POCT GLYCOSYLATED HEMOGLOBIN (HGB A1C): Hemoglobin A1C: 6.1 % — AB (ref 4.0–5.6)

## 2022-02-20 MED ORDER — TRULICITY 1.5 MG/0.5ML ~~LOC~~ SOAJ: 1.5000 mg | SUBCUTANEOUS | 0 refills | Status: DC

## 2022-02-20 NOTE — Progress Notes (Signed)
Established Patient Office Visit     CC/Reason for Visit: 22-month follow-up chronic medical conditions  HPI: Kathy Herman is a 76 y.o. female who is coming in today for the above mentioned reasons. Past Medical History is significant for: Hypertension, hyperlipidemia, type 2 diabetes that has been well controlled, GERD, hypercalcemia secondary to primary hyperparathyroidism.  She saw endocrinology recently, is being followed expectantly.  Recent DEXA scan showed progression of osteopenia with a T score of -2.3.  She continues to have issues with her left knee it is a combination of osteoarthritis and a meniscal tear, she has a mostly healed femur stress fracture.  This is being managed by Dr. Despina Hick.   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 She has never used smokeless tobacco. She reports current alcohol use. No history on file for drug use.  Allergies: Allergies  Allergen Reactions   Erythromycin Nausea Only    Family History:  Family History  Problem Relation Age of Onset   Memory loss Mother    Kidney Stones Father    Congestive Heart Failure Father       Current Outpatient Medications:    Dulaglutide (TRULICITY) 1.5 MG/0.5ML SOPN, Inject 1.5 mg into the skin once a week., Disp: 6 mL, Rfl: 0   FINASTERIDE PO, Take 5 mg by mouth daily., Disp: , Rfl:    metFORMIN (GLUCOPHAGE-XR) 500 MG 24 hr tablet, TAKE 2 TABLETS(1000 MG) BY MOUTH DAILY WITH BREAKFAST, Disp: 180 tablet, Rfl: 1   pantoprazole (PROTONIX) 40 MG tablet, TAKE 1 TABLET(40 MG) BY MOUTH DAILY (Patient taking differently: every other day.), Disp: 90 tablet, Rfl: 1   simvastatin (ZOCOR) 40 MG tablet, TAKE 1 TABLET(40 MG) BY MOUTH AT BEDTIME, Disp: 90 tablet, Rfl: 1  Current Facility-Administered Medications:    cyanocobalamin ((VITAMIN B-12)) injection 1,000 mcg, 1,000 mcg, Intramuscular, Q30 days, Philip Aspen, Limmie Patricia, MD, 1,000 mcg at 02/05/22 4132  Review of Systems:  Constitutional: Denies fever, chills, diaphoresis, appetite change and fatigue.  HEENT: Denies photophobia, eye pain, redness, hearing loss, ear pain, congestion, sore throat, rhinorrhea, sneezing, mouth sores, trouble swallowing, neck pain, neck stiffness and tinnitus.   Respiratory: Denies SOB, DOE, cough, chest tightness,  and wheezing.   Cardiovascular: Denies chest pain, palpitations and leg swelling.  Gastrointestinal: Denies nausea, vomiting, abdominal pain, diarrhea, constipation, blood in stool and abdominal distention.  Genitourinary: Denies dysuria, urgency, frequency, hematuria, flank pain and difficulty urinating.  Endocrine: Denies: hot or cold intolerance, sweats, changes in hair or nails, polyuria, polydipsia. Musculoskeletal: Denies myalgias, back pain. Skin: Denies pallor, rash and wound.  Neurological: Denies  dizziness, seizures, syncope, weakness, light-headedness, numbness and headaches.  Hematological: Denies adenopathy. Easy bruising, personal or family bleeding history  Psychiatric/Behavioral: Denies suicidal ideation, mood changes, confusion, nervousness, sleep disturbance and  agitation    Physical Exam: Vitals:   02/20/22 0846  BP: 124/84  Pulse: 73  Temp: 97.7 F (36.5 C)  TempSrc: Oral  SpO2: 96%  Weight: 167 lb 8 oz (76 kg)    Body mass index is 28.75 kg/m.   Constitutional: NAD, calm, comfortable Eyes: PERRL, lids and conjunctivae normal ENMT: Mucous membranes are moist.  Respiratory: clear to auscultation bilaterally, no wheezing, no crackles. Normal respiratory effort. No accessory muscle use.  Cardiovascular: Regular rate and rhythm, no murmurs / rubs / gallops. No extremity edema.  Psychiatric: Normal judgment and insight. Alert and oriented x 3. Normal mood.    Impression and Plan:  Controlled type 2 diabetes mellitus without complication, without long-term current use of insulin (HCC)  - Plan: POCT glycosylated hemoglobin (Hb A1C), Dulaglutide (TRULICITY) 1.5 MG/0.5ML SOPN -A1c demonstrates good control at 6.1, continue Trulicity 1.5.  Vitamin B12 deficiency -Receiving monthly IM B12, she would like to try daily oral supplementation instead.  Hyperlipidemia associated with type 2 diabetes mellitus (HCC) -LDL of 74 as of February 2023, on simvastatin 40 mg daily.  Primary hyperparathyroidism (HCC) Hypercalcemia -Being managed expectantly by endocrinology.  Gastroesophageal reflux disease without esophagitis -Well-controlled on daily PPI therapy.  Primary osteoarthritis of left knee -Followed by Ortho, will be getting viscous gel injections of the knee in a month or 2.    Time spent:32 minutes reviewing chart, interviewing and examining patient and formulating plan of care.     [name], MD [facility] at [facility]

## 2022-02-27 LAB — HM MAMMOGRAPHY

## 2022-03-04 ENCOUNTER — Encounter: Payer: Self-pay | Admitting: Internal Medicine

## 2022-03-27 ENCOUNTER — Ambulatory Visit (INDEPENDENT_AMBULATORY_CARE_PROVIDER_SITE_OTHER): Payer: BC Managed Care – PPO

## 2022-03-27 DIAGNOSIS — E538 Deficiency of other specified B group vitamins: Secondary | ICD-10-CM

## 2022-03-27 MED ORDER — CYANOCOBALAMIN 1000 MCG/ML IJ SOLN: 1000.0000 ug | Freq: Once | INTRAMUSCULAR | 0 refills | Status: DC

## 2022-03-27 MED ORDER — CYANOCOBALAMIN 1000 MCG/ML IJ SOLN
1000.0000 ug | Freq: Once | INTRAMUSCULAR | Status: AC
  Administered 2022-03-27: 1000 ug via INTRAMUSCULAR

## 2022-03-27 NOTE — Progress Notes (Addendum)
Per orders of Dr. Ardyth Harps, injection of Cyanocobalamin 1000 mcg given by Arvis Miguez L Aydon Swamy. Patient tolerated injection well.   Cyanocobalamin 1000 mcg was sent to the patient's pharmacy in error. I contacted the patient's pharmacy and prescription has been cancelled and removed from medication list.

## 2022-04-11 ENCOUNTER — Other Ambulatory Visit: Payer: Self-pay | Admitting: Internal Medicine

## 2022-04-11 DIAGNOSIS — E1169 Type 2 diabetes mellitus with other specified complication: Secondary | ICD-10-CM

## 2022-04-16 ENCOUNTER — Telehealth (INDEPENDENT_AMBULATORY_CARE_PROVIDER_SITE_OTHER): Payer: BC Managed Care – PPO | Admitting: Internal Medicine

## 2022-04-16 DIAGNOSIS — U071 COVID-19: Secondary | ICD-10-CM

## 2022-04-16 MED ORDER — NIRMATRELVIR/RITONAVIR (PAXLOVID)TABLET: 3.0000 | ORAL_TABLET | Freq: Two times a day (BID) | ORAL | 0 refills | Status: AC

## 2022-04-16 NOTE — Progress Notes (Signed)
Virtual Visit via Video Note  I connected with Kathy Herman on 04/16/22 at  7:00 AM EDT by a video enabled telemedicine application and verified that I am speaking with the correct person using two identifiers.  Location patient: home Location provider: work office Persons participating in the virtual visit: patient, provider  I discussed the limitations of evaluation and management by telemedicine and the availability of in person appointments. The patient expressed understanding and agreed to proceed.   HPI: She was on a trip to [place].  About 8 people who went on the trip with her have since tested positive for COVID.  Yesterday she woke up with body aches and chills and a sore throat.  She tested positive.  She continues to have myalgias and headache.  Some nasal congestion is present as well.   ROS: Constitutional: Denies fever,  diaphoresis.  HEENT: Denies photophobia, eye pain, redness,  mouth sores, trouble swallowing, neck pain, neck stiffness and tinnitus.   Respiratory: Denies SOB, DOE, cough, chest tightness,  and wheezing.   Cardiovascular: Denies chest pain, palpitations and leg swelling.  Gastrointestinal: Denies nausea, vomiting, abdominal pain, diarrhea, constipation, blood in stool and abdominal distention.  Genitourinary: Denies dysuria, urgency, frequency, hematuria, flank pain and difficulty urinating.  Endocrine: Denies: hot or cold intolerance, sweats, changes in hair or nails, polyuria, polydipsia. Musculoskeletal: Denies myalgias, back pain, joint swelling, arthralgias and gait problem.  Skin: Denies pallor, rash and wound.  Neurological: Denies dizziness, seizures, syncope, weakness, light-headedness, numbness and headaches.  Hematological: Denies adenopathy. Easy bruising, personal or family bleeding history  Psychiatric/Behavioral: Denies suicidal ideation, mood changes, confusion, nervousness, sleep disturbance and agitation   Past Medical  History:  Diagnosis Date   Allergic rhinitis, cause unspecified 05/31/2007   CERVICAL RADICULOPATHY, RIGHT 01/07/2010   Diabetes mellitus without complication (HCC)    dx 2019   GERD 07/13/2007   reports hx of reflux that was causing some dysphagia, reports now improved     Hypercalcemia    has since stopped supplemnetal calcium and following pcp for mgt    Hyperlipidemia     Past Surgical History:  Procedure Laterality Date   BLEPHAROPLASTY  2016   CATARACT EXTRACTION Bilateral 2021   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 She has never used smokeless tobacco. She reports current alcohol use. No history on file for drug use.   Current Outpatient Medications:    nirmatrelvir/ritonavir EUA (PAXLOVID) 20 x 150 MG & 10 x 100MG  TABS, Take 3 tablets by mouth 2 (two) times daily for 5 days. (Take nirmatrelvir 150 mg two tablets twice daily for 5 days and ritonavir 100 mg one tablet twice daily for 5 days) Patient GFR is 90, Disp: 30 tablet, Rfl: 0   Dulaglutide (TRULICITY) 1.5 MG/0.5ML SOPN, Inject 1.5 mg into the skin once a week., Disp: 6 mL, Rfl: 0   FINASTERIDE PO, Take 5 mg by mouth daily., Disp: , Rfl:    metFORMIN (GLUCOPHAGE-XR) 500 MG 24 hr tablet,  TAKE 2 TABLETS(1000 MG) BY MOUTH DAILY WITH BREAKFAST, Disp: 180 tablet, Rfl: 1   pantoprazole (PROTONIX) 40 MG tablet, TAKE 1 TABLET(40 MG) BY MOUTH DAILY (Patient taking differently: every other day.), Disp: 90 tablet, Rfl: 1   simvastatin (ZOCOR) 40 MG tablet, TAKE 1 TABLET(40 MG) BY MOUTH AT BEDTIME, Disp: 90 tablet, Rfl:  1  Current Facility-Administered Medications:    cyanocobalamin ((VITAMIN B-12)) injection 1,000 mcg, 1,000 mcg, Intramuscular, Q30 days, Philip Aspen, Limmie Patricia, MD, 1,000 mcg at 02/05/22 3785  EXAM:   VITALS per patient if applicable: none reported  GENERAL: alert, oriented, appears well and in no acute distress  HEENT: atraumatic, conjunttiva clear, no obvious abnormalities on inspection of external nose and ears  NECK: normal movements of the head and neck  LUNGS: on inspection no signs of respiratory distress, breathing rate appears normal, no obvious gross increased work of breathing, gasping or wheezing  CV: no obvious cyanosis  MS: moves all visible extremities without noticeable abnormality  PSYCH/NEURO: pleasant and cooperative, no obvious depression or anxiety, speech and thought processing grossly intact  ASSESSMENT AND PLAN:   COVID-19 - Plan: nirmatrelvir/ritonavir EUA (PAXLOVID) 20 x 150 MG & 10 x 100MG  TABS -She may also use OTC medications such as antihistamines, decongestants, pain relievers, guaifenesin. -We have reviewed quarantine period of 5 days. -We have discussed symptoms that would promote ED evaluation. -She knows to follow with if symptoms fail to resolve. -She has been instructed to hold simvastatin for the next 7 days.     I discussed the assessment and treatment plan with the patient. The patient was provided an opportunity to ask questions and all were answered. The patient agreed with the plan and demonstrated an understanding of the instructions.   The patient was advised to call back or seek an in-person evaluation if the symptoms worsen or if the condition fails to improve as anticipated.    [place], MD  [facility]

## 2022-04-24 ENCOUNTER — Ambulatory Visit (INDEPENDENT_AMBULATORY_CARE_PROVIDER_SITE_OTHER): Payer: BC Managed Care – PPO

## 2022-04-24 DIAGNOSIS — E538 Deficiency of other specified B group vitamins: Secondary | ICD-10-CM | POA: Diagnosis not present

## 2022-04-24 MED ORDER — CYANOCOBALAMIN 1000 MCG/ML IJ SOLN
1000.0000 ug | Freq: Once | INTRAMUSCULAR | Status: AC
  Administered 2022-04-24: 1000 ug via INTRAMUSCULAR

## 2022-04-24 NOTE — Progress Notes (Signed)
Per orders of Dr. Jerilee Hoh, injection of Cyanocobalamin Injection 1000 mcg given by Encarnacion Slates on R deltoid.  Patient tolerated injection well.

## 2022-05-01 ENCOUNTER — Encounter: Payer: Self-pay | Admitting: Internal Medicine

## 2022-05-01 ENCOUNTER — Ambulatory Visit: Payer: BC Managed Care – PPO | Admitting: Internal Medicine

## 2022-05-01 VITALS — BP 110/68 | HR 79 | Temp 97.6°F | Wt 169.3 lb

## 2022-05-01 DIAGNOSIS — E785 Hyperlipidemia, unspecified: Secondary | ICD-10-CM | POA: Diagnosis not present

## 2022-05-01 DIAGNOSIS — E1169 Type 2 diabetes mellitus with other specified complication: Secondary | ICD-10-CM

## 2022-05-01 DIAGNOSIS — Z01810 Encounter for preprocedural cardiovascular examination: Secondary | ICD-10-CM

## 2022-05-01 DIAGNOSIS — Z01818 Encounter for other preprocedural examination: Secondary | ICD-10-CM

## 2022-05-01 DIAGNOSIS — Z23 Encounter for immunization: Secondary | ICD-10-CM | POA: Diagnosis not present

## 2022-05-01 DIAGNOSIS — E119 Type 2 diabetes mellitus without complications: Secondary | ICD-10-CM | POA: Diagnosis not present

## 2022-05-01 MED ORDER — TRULICITY 1.5 MG/0.5ML ~~LOC~~ SOAJ: 1.5000 mg | SUBCUTANEOUS | 0 refills | Status: DC

## 2022-05-01 NOTE — Progress Notes (Signed)
Established Patient Office Visit     CC/Reason for Visit: Preoperative clearance  HPI: Kathy Herman is a 76 y.o. female who is coming in today for the above mentioned reasons. 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  She is having her left knee replaced sometime in November.  She does not have any dyspnea on exertion or decreased exercise tolerance.   Past Medical/Surgical History: Past Medical History:  Diagnosis Date   Allergic rhinitis, cause unspecified 05/31/2007   CERVICAL RADICULOPATHY, RIGHT 01/07/2010   Diabetes mellitus without complication ([place])    dx 2019   GERD 07/13/2007   reports hx of reflux that was causing some dysphagia, reports now improved     Hypercalcemia    has since stopped supplemnetal calcium and following pcp for mgt    Hyperlipidemia     Past Surgical History:  Procedure Laterality Date   BLEPHAROPLASTY  2016   CATARACT EXTRACTION Bilateral 2021   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 12/22/2018   Procedure: Revision right knee uni to total knee arthroplasty;  Surgeon: Gaynelle Arabian, MD;  Location: [facility];  Service: Orthopedics;  Laterality: Right;  178min   MEDIAL PARTIAL KNEE REPLACEMENT Right 2018   TUBAL LIGATION      Social History:  reports that she has never smoked. She has never used smokeless tobacco. She reports current alcohol use. No history on file for drug use.  Allergies: Allergies  Allergen Reactions   Erythromycin Nausea Only    Family History:  Family History  Problem Relation Age of Onset   Memory loss Mother    Kidney Stones Father    Congestive Heart Failure Father      Current Outpatient Medications:    FINASTERIDE PO, Take 5 mg by mouth daily., Disp: , Rfl:    metFORMIN (GLUCOPHAGE-XR) 500 MG 24 hr tablet, TAKE 2 TABLETS(1000 MG) BY MOUTH DAILY WITH BREAKFAST, Disp: 180  tablet, Rfl: 1   pantoprazole (PROTONIX) 40 MG tablet, TAKE 1 TABLET(40 MG) BY MOUTH DAILY (Patient taking differently: every other day.), Disp: 90 tablet, Rfl: 1   simvastatin (ZOCOR) 40 MG tablet, TAKE 1 TABLET(40 MG) BY MOUTH AT BEDTIME, Disp: 90 tablet, Rfl: 1   Dulaglutide (TRULICITY) 1.5 LK/4.4WN SOPN, Inject 1.5 mg into the skin once a week., Disp: 6 mL, Rfl: 0  Current Facility-Administered Medications:    cyanocobalamin ((VITAMIN B-12)) injection 1,000 mcg, 1,000 mcg, Intramuscular, Q30 days, Isaac Bliss, Rayford Halsted, MD, 1,000 mcg at 02/05/22 0272  Review of Systems:  Constitutional: Denies fever, chills, diaphoresis, appetite change and fatigue.  HEENT: Denies photophobia, eye pain, redness, hearing loss, ear pain, congestion, sore throat, rhinorrhea, sneezing, mouth sores, trouble swallowing, neck pain, neck stiffness and tinnitus.   Respiratory: Denies SOB, DOE, cough, chest tightness,  and wheezing.   Cardiovascular: Denies chest pain, palpitations and leg swelling.  Gastrointestinal: Denies nausea, vomiting, abdominal pain, diarrhea, constipation, blood in stool and abdominal distention.  Genitourinary: Denies dysuria, urgency, frequency, hematuria, flank pain and difficulty urinating.  Endocrine: Denies: hot or cold intolerance, sweats, changes in hair or nails, polyuria, polydipsia. Musculoskeletal: Denies myalgias, back pain, joint swelling. Skin: Denies pallor, rash and wound.  Neurological: Denies dizziness, seizures, syncope, weakness, light-headedness, numbness and headaches.  Hematological: Denies adenopathy. Easy bruising, personal or family bleeding history  Psychiatric/Behavioral: Denies suicidal ideation, mood changes, confusion, nervousness, sleep disturbance and agitation    Physical  Exam: Vitals:   05/01/22 0730  BP: 110/68  Pulse: 79  Temp: 97.6 F (36.4 C)  TempSrc: Oral  SpO2: 95%  Weight: 169 lb 4.8 oz (76.8 kg)    Body mass index is 29.06  kg/m.   Constitutional: NAD, calm, comfortable Eyes: PERRL, lids and conjunctivae normal ENMT: Mucous membranes are moist. Posterior pharynx clear of any exudate or lesions. Normal dentition. Tympanic membrane is pearly white, no erythema or bulging. Neck: normal, supple, no masses, no thyromegaly Respiratory: clear to auscultation bilaterally, no wheezing, no crackles. Normal respiratory effort. No accessory muscle use.  Cardiovascular: Regular rate and rhythm, no murmurs / rubs / gallops. No extremity edema. 2+ pedal pulses. No carotid bruits.  Abdomen: no tenderness, no masses palpated. No hepatosplenomegaly. Bowel sounds positive.  Musculoskeletal: no clubbing / cyanosis. No joint deformity upper and lower extremities. Good ROM, no contractures. Normal muscle tone.  Skin: no rashes, lesions, ulcers. No induration Neurologic: CN 2-12 grossly intact. Sensation intact, DTR normal. Strength 5/5 in all 4.  Psychiatric: Normal judgment and insight. Alert and oriented x 3. Normal mood.    Impression and Plan:  Preop cardiovascular exam  Controlled type 2 diabetes mellitus without complication, without long-term current use of insulin (HCC) - Plan: Dulaglutide (TRULICITY) 1.5 MG/0.5ML SOPN  Hyperlipidemia associated with type 2 diabetes mellitus (HCC) - Plan: EKG 12-Lead  Pre-operative clearance - Plan: EKG 12-Lead  Need for influenza vaccination  -She is class I risk, okay to proceed to surgery without further intervention. -EKG in office shows sinus rhythm with normal axis, no acute ST or T wave changes. -Preop form states they will do labs at preop appointment.  Important to follow her calcium levels due to her history of hyperparathyroidism. -Her diabetes has been well controlled and her A1c was 6.1 in July. -Flu vaccine administered in office today.   Time spent:31 minutes reviewing chart, interviewing and examining patient and formulating plan of care.     [name], MD [facility] at [facility]

## 2022-05-01 NOTE — Addendum Note (Signed)
Addended by: Westley Hummer B on: 05/01/2022 03:01 PM   Modules accepted: Orders

## 2022-05-05 ENCOUNTER — Telehealth: Payer: Self-pay | Admitting: Internal Medicine

## 2022-05-05 NOTE — Telephone Encounter (Signed)
Labs and office note was efaxed.

## 2022-05-05 NOTE — Telephone Encounter (Signed)
Kathy Herman at [facility]  Confirmed surgical clearance was received,  but they still need the last labs and office notes (most recent)  2 fax numbers, just in case: [phone]

## 2022-05-13 ENCOUNTER — Telehealth: Payer: Self-pay | Admitting: Internal Medicine

## 2022-05-13 DIAGNOSIS — E119 Type 2 diabetes mellitus without complications: Secondary | ICD-10-CM

## 2022-05-13 NOTE — Telephone Encounter (Signed)
Pt called to request a refill of the  Dulaglutide (TRULICITY) 1.5 BT/5.1VO SOPN  Pt states she is completely out of this medication.  LOV:  05/01/22  Please send to  [facility], [address] [place] [address] Phone:  [phone]  Fax:  [phone]

## 2022-05-15 MED ORDER — TRULICITY 1.5 MG/0.5ML ~~LOC~~ SOAJ: 1.5000 mg | SUBCUTANEOUS | 0 refills | Status: DC

## 2022-05-15 NOTE — Telephone Encounter (Signed)
Rx sent 

## 2022-05-22 ENCOUNTER — Encounter: Payer: Self-pay | Admitting: Internal Medicine

## 2022-05-22 ENCOUNTER — Ambulatory Visit: Payer: BC Managed Care – PPO

## 2022-05-22 ENCOUNTER — Ambulatory Visit: Payer: BC Managed Care – PPO | Admitting: Internal Medicine

## 2022-05-22 VITALS — BP 110/70 | HR 70 | Temp 97.9°F | Wt 170.2 lb

## 2022-05-22 DIAGNOSIS — E1169 Type 2 diabetes mellitus with other specified complication: Secondary | ICD-10-CM

## 2022-05-22 DIAGNOSIS — E210 Primary hyperparathyroidism: Secondary | ICD-10-CM | POA: Diagnosis not present

## 2022-05-22 DIAGNOSIS — E538 Deficiency of other specified B group vitamins: Secondary | ICD-10-CM

## 2022-05-22 DIAGNOSIS — E119 Type 2 diabetes mellitus without complications: Secondary | ICD-10-CM

## 2022-05-22 DIAGNOSIS — E785 Hyperlipidemia, unspecified: Secondary | ICD-10-CM

## 2022-05-22 LAB — POCT GLYCOSYLATED HEMOGLOBIN (HGB A1C): Hemoglobin A1C: 5.7 % — AB (ref 4.0–5.6)

## 2022-05-22 MED ORDER — CYANOCOBALAMIN 1000 MCG/ML IJ SOLN
1000.0000 ug | Freq: Once | INTRAMUSCULAR | Status: AC
  Administered 2022-05-22: 1000 ug via INTRAMUSCULAR

## 2022-05-22 NOTE — Addendum Note (Signed)
Addended by: Westley Hummer B on: 05/22/2022 10:13 AM   Modules accepted: Orders

## 2022-05-22 NOTE — Progress Notes (Signed)
Established Patient Office Visit     CC/Reason for Visit: 83-month follow-up chronic medical condition  HPI: Kathy Herman is a 76 y.o. female who is coming in today for the above mentioned reasons. Past Medical History is significant for: Hyperlipidemia, type 2 diabetes, GERD, hyperparathyroidism with hypercalcemia followed by endocrinology.  She also has B12 deficiency.  She is here today for follow-up.  She is scheduled for knee replacement in November.   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 She has never used smokeless tobacco. She reports current alcohol use. No history on file for drug use.  Allergies: Allergies  Allergen Reactions   Erythromycin Nausea Only    Family History:  Family History  Problem Relation Age of Onset   Memory loss Mother    Kidney Stones Father    Congestive Heart Failure Father      Current Outpatient Medications:    Dulaglutide (TRULICITY) 1.5 MG/0.5ML SOPN, Inject 1.5 mg into the skin once a week., Disp: 6 mL, Rfl: 0   FINASTERIDE PO, Take 5 mg by mouth daily., Disp: , Rfl:    metFORMIN (GLUCOPHAGE-XR) 500 MG 24 hr  tablet, TAKE 2 TABLETS(1000 MG) BY MOUTH DAILY WITH BREAKFAST, Disp: 180 tablet, Rfl: 1   pantoprazole (PROTONIX) 40 MG tablet, TAKE 1 TABLET(40 MG) BY MOUTH DAILY (Patient taking differently: every other day.), Disp: 90 tablet, Rfl: 1   simvastatin (ZOCOR) 40 MG tablet, TAKE 1 TABLET(40 MG) BY MOUTH AT BEDTIME, Disp: 90 tablet, Rfl: 1  Current Facility-Administered Medications:    cyanocobalamin ((VITAMIN B-12)) injection 1,000 mcg, 1,000 mcg, Intramuscular, Q30 days, Philip Aspen, Limmie Patricia, MD, 1,000 mcg at 02/05/22 5093  Review of Systems:  Constitutional: Denies fever, chills, diaphoresis, appetite change and fatigue.  HEENT: Denies photophobia, eye pain, redness, hearing loss, ear pain, congestion, sore throat, rhinorrhea, sneezing, mouth sores, trouble swallowing, neck pain, neck stiffness and tinnitus.   Respiratory: Denies SOB, DOE, cough, chest tightness,  and wheezing.   Cardiovascular: Denies chest pain, palpitations and leg swelling.  Gastrointestinal: Denies nausea, vomiting, abdominal pain, diarrhea, constipation, blood in stool and abdominal distention.  Genitourinary: Denies dysuria, urgency, frequency, hematuria, flank pain and difficulty urinating.  Endocrine: Denies: hot or cold intolerance, sweats, changes in hair or nails, polyuria, polydipsia. Musculoskeletal: Denies myalgias, back pain, joint swelling, arthralgias and gait problem.  Skin: Denies pallor, rash and wound.  Neurological: Denies dizziness, seizures, syncope, weakness, light-headedness, numbness and headaches.  Hematological: Denies adenopathy. Easy bruising, personal or family bleeding history  Psychiatric/Behavioral: Denies suicidal ideation, mood changes, confusion, nervousness, sleep disturbance and agitation  Physical Exam: Vitals:   05/22/22 0851  BP: 110/70  Pulse: 70  Temp: 97.9 F (36.6 C)  TempSrc: Oral  SpO2: 96%  Weight: 170 lb 3.2 oz (77.2 kg)    Body mass index is 29.21  kg/m.   Constitutional: NAD, calm, comfortable Eyes: PERRL, lids and conjunctivae normal, wears corrective lenses ENMT: Mucous membranes are moist.  Respiratory: clear to auscultation bilaterally, no wheezing, no crackles. Normal respiratory effort. No accessory muscle use.  Cardiovascular: Regular rate and rhythm, no murmurs / rubs / gallops. No extremity edema. Psychiatric: Normal judgment and insight. Alert and oriented x 3. Normal mood.    Impression and Plan:  Controlled type 2 diabetes mellitus without complication, without long-term current use of insulin ([place]) - Plan: POCT glycosylated hemoglobin (Hb A1C)  Hyperlipidemia associated with type 2 diabetes mellitus ([place])  Vitamin B12 deficiency  Primary hyperparathyroidism ([place])   -Blood pressure is well controlled. -A1c demonstrates excellent diabetic control at 5.7. -She received an intramuscular B12 injection today for her B12 deficiency. -Her last LDL was 74 in February. -She is scheduled for knee replacement surgery on November 3.  Time spent:30 minutes reviewing chart, interviewing and examining patient and formulating plan of care.      [name], MD [facility] at [facility]

## 2022-06-09 ENCOUNTER — Other Ambulatory Visit (HOSPITAL_COMMUNITY): Payer: Self-pay | Admitting: Orthopedic Surgery

## 2022-06-09 ENCOUNTER — Ambulatory Visit (HOSPITAL_COMMUNITY)
Admission: RE | Admit: 2022-06-09 | Discharge: 2022-06-09 | Disposition: A | Discharge status: Home / Self Care | Payer: BC Managed Care – PPO | Source: Ambulatory Visit | Attending: Orthopedic Surgery | Admitting: Orthopedic Surgery

## 2022-06-09 DIAGNOSIS — M7989 Other specified soft tissue disorders: Secondary | ICD-10-CM | POA: Insufficient documentation

## 2022-06-18 NOTE — Progress Notes (Signed)
Name: Kathy Herman  MRN/ DOB: [id number], [birth year]    Age/ Sex: 76 y.o., female     PCP: Isaac Bliss, Rayford Halsted, MD   Reason for Endocrinology Evaluation: Hypercalcemia      Initial Endocrinology Clinic Visit: 10/26/2018    PATIENT IDENTIFIER: Kathy Herman is a 76 y.o., female with a past medical history of T2DM, Dyslipidemia and DJD. She has followed with [facility] since 10/26/2018  for consultative assistance with management of her hypercalcemia.   HISTORICAL SUMMARY: The patient was first diagnosed with hypercalcemia in 08/2018.   Ca/Cr ratio is 0.024 which is consistent with Primary hyperparathyroidism 02/2019 at 288 mg, repeat 24-hr urine calcium was 176 mg  01/2020 No evidence of renal calculi on KUB 02/2019 DXA shows osteopenia 12/2019 and in 12/2021  SUBJECTIVE:   Today (06/19/2022):  Ms. [name] is here for a follow up on hypercalcemia.   She is S/P left knee replacement 06/06/2022  She has  polyuria and polydipsia  Has mild constipation - takes stool softners  No renal stones  No recent falls  She consumes ~ 2 servings of calcium   Vitamin D 1000 iu daily - she has not started as she went to the parmacy and only found ones with calcium   HISTORY:  Past Medical History:  Past Medical History:  Diagnosis Date   Allergic rhinitis, cause unspecified 05/31/2007   CERVICAL RADICULOPATHY, RIGHT 01/07/2010   Diabetes mellitus without complication ([place])    dx 2019   GERD 07/13/2007   reports hx of reflux that was causing some dysphagia, reports now improved     Hypercalcemia    has since stopped supplemnetal calcium and following pcp for mgt    Hyperlipidemia    Past Surgical History:  Past Surgical History:  Procedure Laterality Date   BLEPHAROPLASTY  2016   CATARACT EXTRACTION Bilateral 2021   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 12/22/2018   Procedure: Revision right knee uni to total knee arthroplasty;  Surgeon: Gaynelle Arabian, MD;   Location: [facility];  Service: Orthopedics;  Laterality: Right;  15mn   MEDIAL PARTIAL KNEE REPLACEMENT Right 2018   TUBAL LIGATION     Social History:  reports that she has never smoked. She has never used smokeless tobacco. She reports current alcohol use. No history on file for drug use. Family History:  Family History  Problem Relation Age of Onset   Memory loss Mother    Kidney Stones Father    Congestive Heart Failure Father      HOME MEDICATIONS: Allergies as of 06/19/2022       Reactions   Erythromycin Nausea Only        Medication List        Accurate as of June 19, 2022  8:54 AM. If you have any questions, ask your nurse or doctor.          FINASTERIDE PO Take 5 mg by mouth daily.   metFORMIN 500 MG 24 hr tablet Commonly known as: GLUCOPHAGE-XR TAKE 2 TABLETS(1000 MG) BY MOUTH DAILY WITH BREAKFAST   pantoprazole 40 MG tablet Commonly known as: PROTONIX TAKE 1 TABLET(40 MG) BY MOUTH DAILY What changed: See the new instructions.   simvastatin 40 MG tablet Commonly known as: ZOCOR TAKE 1 TABLET(40 MG) BY MOUTH AT BEDTIME   Trulicity 1.5 MFI/4.3PISopn Generic drug: Dulaglutide Inject 1.5 mg into the skin once a week.          OBJECTIVE:  PHYSICAL EXAM: VS: BP 116/70 (BP Location: Left Arm, Patient Position: Sitting, Cuff Size: Small)   Pulse 82   Ht _0  (1.626 m)   Wt 171 lb (77.6 kg)   SpO2 98%   BMI 29.35 kg/m     EXAM: General: Pt appears well and is in NAD  Neck: General: Supple without adenopathy. Thyroid: Thyroid size normal.  No goiter or nodules appreciated. No thyroid bruit.  Lungs: Clear with good BS bilat with no rales, rhonchi, or wheezes  Heart: Auscultation: RRR.  Abdomen: Normoactive bowel sounds, soft, nontender, without masses or organomegaly palpable  Extremities:  BL LE: Trace right pretibial edema normal  Mental Status: Judgment, insight: Intact Orientation: Oriented to time, place, and person Mood and  affect: No depression, anxiety, or agitation     DATA REVIEWED:   Latest Reference Range & Units 06/19/22 09:10  Calcium 8.6 - 10.4 mg/dL 11.4 (H)  Albumin 3.5 - 5.2 g/dL 4.1  VITD 30.00 - 100.00 ng/mL 21.05 (L)     ASSESSMENT / PLAN / RECOMMENDATIONS:   Hyperparathyroidism:   - Corrected serum calcium is 10.46 mg/dL, ionized calcium is at the upper limit of normal - PTH is pending today  - Pt has not met criteria for surgical intervention thus far , we will wait on DEXA results -The patient has been diagnosed with stress fractures, which are NOT generally considered fragility fractures - Serum calcium increased ( corrected 11.3 mg/dL ) , will encourage hydration ,as this is unheard of in the post-op period    Recommendations: Encouraged hydration Avoid OTC calcium supplements Maintain 2-3 servings of calcium a day    2.  Vitamin D insufficiency:  -Unfortunately she has not been able to start OTC vitamin D3 because the once she could find all had calcium -I have advised the patient that she may need to look online for plain vitamin D3 -We discussed the importance of vitamin D3 and improving bone and muscle health  Medication Start vitamin D3 2000 IU daily   F/U in 6 months   Signed electronically by: Mack Guise, MD  [facility]  [facility] [place], [place] [address] Phone: [phone] FAX: [phone]      CC: Isaac Bliss, Rayford Halsted, MD [place] [address] Phone: [phone]  Fax: [phone]   Return to Endocrinology clinic as below: Future Appointments  Date Time Provider [place]  06/19/2022  3:45 PM [facility]SE [facility]  07/17/2022  9:00 AM [facility]  08/25/2022  8:30 AM Erline Hau, MD [facility]

## 2022-06-19 ENCOUNTER — Ambulatory Visit: Payer: BC Managed Care – PPO | Admitting: Internal Medicine

## 2022-06-19 ENCOUNTER — Encounter: Payer: Self-pay | Admitting: Internal Medicine

## 2022-06-19 ENCOUNTER — Ambulatory Visit: Payer: BC Managed Care – PPO

## 2022-06-19 VITALS — BP 116/70 | HR 82 | Ht 64.0 in | Wt 171.0 lb

## 2022-06-19 DIAGNOSIS — E210 Primary hyperparathyroidism: Secondary | ICD-10-CM

## 2022-06-19 DIAGNOSIS — E559 Vitamin D deficiency, unspecified: Secondary | ICD-10-CM

## 2022-06-19 LAB — VITAMIN D 25 HYDROXY (VIT D DEFICIENCY, FRACTURES): VITD: 21.05 ng/mL — ABNORMAL LOW (ref 30.00–100.00)

## 2022-06-19 LAB — ALBUMIN: Albumin: 4.1 g/dL (ref 3.5–5.2)

## 2022-06-19 NOTE — Patient Instructions (Addendum)
-   Stay hydrated  - Continue to avoid over the counter calcium supplements - Continue to consume 2-3 servings of calcium in your diet daily  - Start Vitamin D3 at 1000 iu daily , unless your labs show you need more, I will keep you posted through the portal

## 2022-06-20 LAB — PTH, INTACT AND CALCIUM
Calcium: 11.4 mg/dL — ABNORMAL HIGH (ref 8.6–10.4)
PTH: 50 pg/mL (ref 16–77)

## 2022-07-03 ENCOUNTER — Telehealth: Payer: Self-pay

## 2022-07-03 NOTE — Telephone Encounter (Signed)
Patient was able to find the Vitamin D3 without calcium but it 2000iu instead of 1000iu. Wants to know if it would be okay to take every other day.

## 2022-07-17 ENCOUNTER — Ambulatory Visit (INDEPENDENT_AMBULATORY_CARE_PROVIDER_SITE_OTHER): Payer: BC Managed Care – PPO

## 2022-07-17 DIAGNOSIS — E538 Deficiency of other specified B group vitamins: Secondary | ICD-10-CM | POA: Diagnosis not present

## 2022-07-17 MED ORDER — CYANOCOBALAMIN 1000 MCG/ML IJ SOLN
1000.0000 ug | Freq: Once | INTRAMUSCULAR | Status: AC
  Administered 2022-07-17: 1000 ug via INTRAMUSCULAR

## 2022-07-17 NOTE — Progress Notes (Signed)
Per orders of Dr. Hernandez, injection of Cyanocobalamin 1000 mcg given by Keyleen Cerrato L [place] Br[place]. Patient tolerated injection well.  

## 2022-08-11 ENCOUNTER — Telehealth: Payer: Self-pay | Admitting: Internal Medicine

## 2022-08-11 DIAGNOSIS — E119 Type 2 diabetes mellitus without complications: Secondary | ICD-10-CM

## 2022-08-11 MED ORDER — TRULICITY 1.5 MG/0.5ML ~~LOC~~ SOAJ: 1.5000 mg | SUBCUTANEOUS | 0 refills | Status: DC

## 2022-08-11 NOTE — Telephone Encounter (Signed)
Patient states she contacted her pharmacy with no response--she needs a refill on her Trulicity.  Her next appointment is on 01/22 and she is running out.    Pharmacy- [facility] on [address]

## 2022-08-11 NOTE — Telephone Encounter (Signed)
Refill sent.

## 2022-08-13 ENCOUNTER — Telehealth: Payer: Self-pay | Admitting: Internal Medicine

## 2022-08-13 DIAGNOSIS — E119 Type 2 diabetes mellitus without complications: Secondary | ICD-10-CM

## 2022-08-13 MED ORDER — TRULICITY 1.5 MG/0.5ML ~~LOC~~ SOAJ: 1.5000 mg | SUBCUTANEOUS | 0 refills | Status: DC

## 2022-08-13 NOTE — Telephone Encounter (Signed)
Pt located the Dulaglutide (TRULICITY) 1.5 OM/8.5TC SOPN  please send to  [place], [place] Phone: [phone]  Fax: [phone]

## 2022-08-13 NOTE — Telephone Encounter (Signed)
Refill sent.

## 2022-08-14 ENCOUNTER — Ambulatory Visit (INDEPENDENT_AMBULATORY_CARE_PROVIDER_SITE_OTHER): Payer: BC Managed Care – PPO

## 2022-08-14 DIAGNOSIS — E538 Deficiency of other specified B group vitamins: Secondary | ICD-10-CM | POA: Diagnosis not present

## 2022-08-14 NOTE — Progress Notes (Signed)
Pt here for monthly B12 injection per Dr Jerilee Hoh  B12 1040mcg given IM left deltoid and pt tolerated injection well.  Next B12 injection scheduled for 09/16/22.

## 2022-08-25 ENCOUNTER — Ambulatory Visit (INDEPENDENT_AMBULATORY_CARE_PROVIDER_SITE_OTHER): Payer: BC Managed Care – PPO | Admitting: Internal Medicine

## 2022-08-25 ENCOUNTER — Encounter: Payer: Self-pay | Admitting: Internal Medicine

## 2022-08-25 VITALS — BP 120/70 | HR 88 | Temp 97.8°F | Wt 169.5 lb

## 2022-08-25 DIAGNOSIS — K219 Gastro-esophageal reflux disease without esophagitis: Secondary | ICD-10-CM | POA: Diagnosis not present

## 2022-08-25 DIAGNOSIS — E1169 Type 2 diabetes mellitus with other specified complication: Secondary | ICD-10-CM

## 2022-08-25 DIAGNOSIS — E119 Type 2 diabetes mellitus without complications: Secondary | ICD-10-CM

## 2022-08-25 DIAGNOSIS — E538 Deficiency of other specified B group vitamins: Secondary | ICD-10-CM

## 2022-08-25 DIAGNOSIS — E210 Primary hyperparathyroidism: Secondary | ICD-10-CM | POA: Diagnosis not present

## 2022-08-25 DIAGNOSIS — E785 Hyperlipidemia, unspecified: Secondary | ICD-10-CM

## 2022-08-25 LAB — POCT GLYCOSYLATED HEMOGLOBIN (HGB A1C): Hemoglobin A1C: 6.2 % — AB (ref 4.0–5.6)

## 2022-08-25 NOTE — Progress Notes (Signed)
Established Patient Office Visit     CC/Reason for Visit: Follow-up chronic conditions  HPI: Kathy Herman is a 77 y.o. female who is coming in today for the above mentioned reasons. Past Medical History is significant for: Hyperlipidemia, type 2 diabetes, GERD, vitamin B12 deficiency and hyperparathyroidism.  She had her knee replacement in December, is recovering well.  No acute concerns.   Past Medical/Surgical History: Past Medical History:  Diagnosis Date   Allergic rhinitis, cause unspecified 05/31/2007   CERVICAL RADICULOPATHY, RIGHT 01/07/2010   Diabetes mellitus without complication ([place])    dx 2019   GERD 07/13/2007   reports hx of reflux that was causing some dysphagia, reports now improved     Hypercalcemia    has since stopped supplemnetal calcium and following pcp for mgt    Hyperlipidemia     Past Surgical History:  Procedure Laterality Date   BLEPHAROPLASTY  2016   CATARACT EXTRACTION Bilateral 2021   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 12/22/2018   Procedure: Revision right knee uni to total knee arthroplasty;  Surgeon: Gaynelle Arabian, MD;  Location: [facility];  Service: Orthopedics;  Laterality: Right;  155min   MEDIAL PARTIAL KNEE REPLACEMENT Right 2018   TUBAL LIGATION      Social History:  reports that she has never smoked. She has never used smokeless tobacco. She reports current alcohol use. No history on file for drug use.  Allergies: Allergies  Allergen Reactions   Erythromycin Nausea Only    Family History:  Family History  Problem Relation Age of Onset   Memory loss Mother    Kidney Stones Father    Congestive Heart Failure Father      Current Outpatient Medications:    CELEBREX 200 MG capsule, Take 200 mg by mouth 2 (two) times daily., Disp: , Rfl:    Cholecalciferol (VITAMIN D) 50 MCG (2000 UT) CAPS, Take by mouth., Disp: , Rfl:    Dulaglutide (TRULICITY) 1.5 ZY/6.0YT SOPN, Inject 1.5 mg into the skin once a week., Disp: 6 mL, Rfl:  0   FINASTERIDE PO, Take 5 mg by mouth daily., Disp: , Rfl:    gabapentin (NEURONTIN) 100 MG capsule, SMARTSIG:1 Capsule(s) By Mouth Every Evening, Disp: , Rfl:    Magnesium 250 MG TABS, Take by mouth., Disp: , Rfl:    metFORMIN (GLUCOPHAGE-XR) 500 MG 24 hr tablet, TAKE 2 TABLETS(1000 MG) BY MOUTH DAILY WITH BREAKFAST, Disp: 180 tablet, Rfl: 1   pantoprazole (PROTONIX) 40 MG tablet, TAKE 1 TABLET(40 MG) BY MOUTH DAILY (Patient taking differently: every other day.), Disp: 90 tablet, Rfl: 1   simvastatin (ZOCOR) 40 MG tablet, TAKE 1 TABLET(40 MG) BY MOUTH AT BEDTIME, Disp: 90 tablet, Rfl: 1   tiZANidine (ZANAFLEX) 2 MG tablet, Take 2 mg by mouth., Disp: , Rfl:   Current Facility-Administered Medications:    cyanocobalamin ((VITAMIN B-12)) injection 1,000 mcg, 1,000 mcg, Intramuscular, Q30 days, Isaac Bliss, Rayford Halsted, MD, 1,000 mcg at 08/14/22 0920  Review of Systems:  Negative unless indicated in HPI.   Physical Exam: Vitals:   08/25/22 0840  BP: 120/70  Pulse: 88  Temp: 97.8 F (36.6 C)  TempSrc: Oral  SpO2: 96%  Weight: 169 lb 8 oz (76.9 kg)    Body mass index is 29.09 kg/m.   Physical Exam Vitals reviewed.  Constitutional:      Appearance: Normal appearance.  HENT:     Head: Normocephalic and atraumatic.  Eyes:     Conjunctiva/sclera: Conjunctivae  normal.     Pupils: Pupils are equal, round, and reactive to light.  Cardiovascular:     Rate and Rhythm: Normal rate and regular rhythm.  Pulmonary:     Effort: Pulmonary effort is normal.     Breath sounds: Normal breath sounds.  Skin:    General: Skin is warm and dry.  Neurological:     General: No focal deficit present.     Mental Status: She is alert and oriented to person, place, and time.  Psychiatric:        Mood and Affect: Mood normal.        Behavior: Behavior normal.        Thought Content: Thought content normal.        Judgment: Judgment normal.      Impression and Plan:  Controlled type 2  diabetes mellitus without complication, without long-term current use of insulin ([place]) - Plan: POCT glycosylated hemoglobin (Hb A1C)  Vitamin B12 deficiency  Primary hyperparathyroidism (HCC)  Hyperlipidemia associated with type 2 diabetes mellitus (HCC)  Gastroesophageal reflux disease without esophagitis  -A1c of 6.2 demonstrates excellent diabetic control, continue metformin and Trulicity. -Blood pressure is well-controlled. -She is recovering well from her knee replacement in December.  She has completed physical therapy.  She is on Celebrex and gabapentin DeVellis tizanidine as needed. -Continue simvastatin. -She has a CPE with labs scheduled in a few months.  Time spent:31 minutes reviewing chart, interviewing and examining patient and formulating plan of care.     [name], MD [name] at [facility]

## 2022-09-06 ENCOUNTER — Other Ambulatory Visit: Payer: Self-pay | Admitting: Internal Medicine

## 2022-09-06 DIAGNOSIS — K219 Gastro-esophageal reflux disease without esophagitis: Secondary | ICD-10-CM

## 2022-09-16 ENCOUNTER — Ambulatory Visit: Payer: BC Managed Care – PPO

## 2022-09-17 ENCOUNTER — Ambulatory Visit (INDEPENDENT_AMBULATORY_CARE_PROVIDER_SITE_OTHER): Payer: BC Managed Care – PPO | Admitting: *Deleted

## 2022-09-17 DIAGNOSIS — E538 Deficiency of other specified B group vitamins: Secondary | ICD-10-CM

## 2022-09-17 MED ORDER — CYANOCOBALAMIN 1000 MCG/ML IJ SOLN
1000.0000 ug | Freq: Once | INTRAMUSCULAR | Status: AC
  Administered 2022-09-17: 1000 ug via INTRAMUSCULAR

## 2022-09-17 NOTE — Progress Notes (Signed)
Per orders of Dr. Jerilee Hoh, injection of b12 given by Westley Hummer. Patient tolerated injection well.

## 2022-09-26 ENCOUNTER — Other Ambulatory Visit: Payer: Self-pay | Admitting: Internal Medicine

## 2022-09-26 DIAGNOSIS — E119 Type 2 diabetes mellitus without complications: Secondary | ICD-10-CM

## 2022-10-15 ENCOUNTER — Ambulatory Visit (INDEPENDENT_AMBULATORY_CARE_PROVIDER_SITE_OTHER): Payer: BC Managed Care – PPO | Admitting: *Deleted

## 2022-10-15 DIAGNOSIS — E538 Deficiency of other specified B group vitamins: Secondary | ICD-10-CM

## 2022-10-15 MED ORDER — CYANOCOBALAMIN 1000 MCG/ML IJ SOLN
1000.0000 ug | Freq: Once | INTRAMUSCULAR | Status: AC
  Administered 2022-10-15: 1000 ug via INTRAMUSCULAR

## 2022-10-15 NOTE — Progress Notes (Signed)
Per orders of Dr. Burchette, injection of Cyanocobalamin 1000mcg given by Caylie Sandquist A. Patient tolerated injection well.  

## 2022-10-21 ENCOUNTER — Other Ambulatory Visit (HOSPITAL_COMMUNITY): Payer: Self-pay

## 2022-10-21 ENCOUNTER — Ambulatory Visit (INDEPENDENT_AMBULATORY_CARE_PROVIDER_SITE_OTHER): Payer: BC Managed Care – PPO | Admitting: Internal Medicine

## 2022-10-21 ENCOUNTER — Other Ambulatory Visit: Payer: Self-pay | Admitting: Internal Medicine

## 2022-10-21 ENCOUNTER — Encounter: Payer: Self-pay | Admitting: Internal Medicine

## 2022-10-21 VITALS — BP 113/60 | HR 83 | Temp 97.6°F | Ht 64.0 in | Wt 171.7 lb

## 2022-10-21 DIAGNOSIS — Z0000 Encounter for general adult medical examination without abnormal findings: Secondary | ICD-10-CM

## 2022-10-21 DIAGNOSIS — Z6830 Body mass index (BMI) 30.0-30.9, adult: Secondary | ICD-10-CM | POA: Diagnosis not present

## 2022-10-21 DIAGNOSIS — E1169 Type 2 diabetes mellitus with other specified complication: Secondary | ICD-10-CM | POA: Diagnosis not present

## 2022-10-21 DIAGNOSIS — E119 Type 2 diabetes mellitus without complications: Secondary | ICD-10-CM

## 2022-10-21 DIAGNOSIS — E6609 Other obesity due to excess calories: Secondary | ICD-10-CM

## 2022-10-21 DIAGNOSIS — E210 Primary hyperparathyroidism: Secondary | ICD-10-CM | POA: Diagnosis not present

## 2022-10-21 DIAGNOSIS — E538 Deficiency of other specified B group vitamins: Secondary | ICD-10-CM

## 2022-10-21 DIAGNOSIS — E785 Hyperlipidemia, unspecified: Secondary | ICD-10-CM

## 2022-10-21 DIAGNOSIS — E559 Vitamin D deficiency, unspecified: Secondary | ICD-10-CM

## 2022-10-21 LAB — CBC WITH DIFFERENTIAL/PLATELET
Basophils Absolute: 0.1 10*3/uL (ref 0.0–0.1)
Basophils Relative: 1.3 % (ref 0.0–3.0)
Eosinophils Absolute: 0.2 10*3/uL (ref 0.0–0.7)
Eosinophils Relative: 3.4 % (ref 0.0–5.0)
HCT: 39.8 % (ref 36.0–46.0)
Hemoglobin: 13.4 g/dL (ref 12.0–15.0)
Lymphocytes Relative: 24.6 % (ref 12.0–46.0)
Lymphs Abs: 1.6 10*3/uL (ref 0.7–4.0)
MCHC: 33.6 g/dL (ref 30.0–36.0)
MCV: 86.4 fl (ref 78.0–100.0)
Monocytes Absolute: 0.5 10*3/uL (ref 0.1–1.0)
Monocytes Relative: 7.2 % (ref 3.0–12.0)
Neutro Abs: 4.2 10*3/uL (ref 1.4–7.7)
Neutrophils Relative %: 63.5 % (ref 43.0–77.0)
Platelets: 266 10*3/uL (ref 150.0–400.0)
RBC: 4.61 Mil/uL (ref 3.87–5.11)
RDW: 13.6 % (ref 11.5–15.5)
WBC: 6.7 10*3/uL (ref 4.0–10.5)

## 2022-10-21 LAB — COMPREHENSIVE METABOLIC PANEL
ALT: 18 U/L (ref 0–35)
AST: 19 U/L (ref 0–37)
Albumin: 4.3 g/dL (ref 3.5–5.2)
Alkaline Phosphatase: 90 U/L (ref 39–117)
BUN: 15 mg/dL (ref 6–23)
CO2: 23 mEq/L (ref 19–32)
Calcium: 10.8 mg/dL — ABNORMAL HIGH (ref 8.4–10.5)
Chloride: 107 mEq/L (ref 96–112)
Creatinine, Ser: 0.72 mg/dL (ref 0.40–1.20)
GFR: 81.13 mL/min (ref >60.00)
Glucose, Bld: 136 mg/dL — ABNORMAL HIGH (ref 70–99)
Potassium: 4.6 mEq/L (ref 3.5–5.1)
Sodium: 140 mEq/L (ref 135–145)
Total Bilirubin: 0.5 mg/dL (ref 0.2–1.2)
Total Protein: 7 g/dL (ref 6.0–8.3)

## 2022-10-21 LAB — VITAMIN B12: Vitamin B-12: 364 pg/mL (ref 211–911)

## 2022-10-21 LAB — MICROALBUMIN / CREATININE URINE RATIO
Creatinine,U: 103.3 mg/dL
Microalb Creat Ratio: 0.7 mg/g (ref 0.0–30.0)
Microalb, Ur: <0.7 mg/dL (ref 0.0–1.9)

## 2022-10-21 LAB — LIPID PANEL
Cholesterol: 135 mg/dL (ref 0–200)
HDL: 59 mg/dL (ref >39.00)
LDL Cholesterol: 63 mg/dL (ref 0–99)
NonHDL: 76.21
Total CHOL/HDL Ratio: 2
Triglycerides: 68 mg/dL (ref 0.0–149.0)
VLDL: 13.6 mg/dL (ref 0.0–40.0)

## 2022-10-21 LAB — HEMOGLOBIN A1C: Hgb A1c MFr Bld: 6.8 % — ABNORMAL HIGH (ref 4.6–6.5)

## 2022-10-21 LAB — VITAMIN D 25 HYDROXY (VIT D DEFICIENCY, FRACTURES): VITD: 25.99 ng/mL — ABNORMAL LOW (ref 30.00–100.00)

## 2022-10-21 LAB — TSH: TSH: 2.93 u[IU]/mL (ref 0.35–5.50)

## 2022-10-21 MED ORDER — VITAMIN D (ERGOCALCIFEROL) 1.25 MG (50000 UNIT) PO CAPS: 50000.0000 [IU] | ORAL_CAPSULE | ORAL | 0 refills | Status: AC

## 2022-10-21 MED ORDER — TIRZEPATIDE 2.5 MG/0.5ML ~~LOC~~ SOAJ
2.5000 mg | SUBCUTANEOUS | 0 refills | Status: DC
  Filled 2022-10-21: qty 2, 28d supply, fill #0
  Filled 2022-11-24: qty 2, 28d supply, fill #1

## 2022-10-21 NOTE — Progress Notes (Signed)
Established Patient Office Visit     CC/Reason for Visit: Annual preventive exam and follow-up chronic conditions  HPI: Kathy Herman is a 77 y.o. female who is coming in today for the above mentioned reasons. Past Medical History is significant for: Hyperlipidemia, type 2 diabetes, GERD, hyperparathyroidism, vitamin B12 deficiency, obesity.  She has been feeling well.  She has had some continued postsurgical knee pain.  She has routine eye and dental care, wears hearing aids bilaterally.  She would like to set from Trulicity to an alternative drug due to supply issues.  She is due for RSV and COVID vaccines.  Cancer screening is up-to-date, DEXA is up-to-date.   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 She has never used smokeless tobacco. She reports current alcohol use. No history on file for drug use.  Allergies: Allergies  Allergen Reactions   Erythromycin Nausea Only    Family History:  Family History  Problem Relation Age of Onset   Memory loss Mother    Kidney Stones Father    Congestive Heart Failure Father      Current Outpatient Medications:     CELEBREX 200 MG capsule, Take 200 mg by mouth at bedtime as needed., Disp: , Rfl:    Cholecalciferol (VITAMIN D) 50 MCG (2000 UT) CAPS, Take by mouth., Disp: , Rfl:    cyanocobalamin (VITAMIN B12) 1000 MCG/ML injection, Inject 1,000 mcg into the muscle once., Disp: , Rfl:    FINASTERIDE PO, Take 5 mg by mouth daily., Disp: , Rfl:    gabapentin (NEURONTIN) 100 MG capsule, at bedtime as needed., Disp: , Rfl:    Magnesium 250 MG TABS, Take by mouth., Disp: , Rfl:    metFORMIN (GLUCOPHAGE-XR) 500 MG 24 hr tablet, TAKE 2 TABLETS(1000 MG) BY MOUTH DAILY WITH BREAKFAST, Disp: 180 tablet, Rfl: 1   pantoprazole (PROTONIX) 40 MG tablet, TAKE 1 TABLET(40 MG) BY MOUTH DAILY, Disp: 90 tablet, Rfl: 1   simvastatin (ZOCOR) 40 MG tablet, TAKE 1 TABLET(40 MG) BY MOUTH AT BEDTIME, Disp: 90 tablet, Rfl: 1   tirzepatide (MOUNJARO) 2.5 MG/0.5ML Pen, Inject 2.5 mg into the skin once a week., Disp: 6 mL, Rfl: 0   tiZANidine (ZANAFLEX) 2 MG tablet, Take 2 mg by mouth at bedtime as needed., Disp: , Rfl:   Review of Systems:  Negative unless indicated in HPI.   Physical Exam: Vitals:   10/21/22 0812  BP: 113/60  Pulse: 83  Temp: 97.6 F (36.4 C)  TempSrc: Oral  SpO2: 96%  Weight: 171 lb 11.2 oz (77.9 kg)  Height: 5\' 4"  (1.626 m)  Body mass index is 29.47 kg/m.   Physical Exam Vitals reviewed.  Constitutional:      General: She is not in acute distress.    Appearance: Normal appearance. She is not ill-appearing, toxic-appearing or diaphoretic.  HENT:     Head: Normocephalic.     Right Ear: Tympanic membrane, ear canal and external ear normal. There is no impacted cerumen.     Left Ear: Tympanic membrane, ear canal and external ear normal. There is no impacted cerumen.     Nose: Nose normal.     Mouth/Throat:     Mouth: Mucous membranes are moist.     Pharynx: Oropharynx is clear. No oropharyngeal exudate or posterior oropharyngeal erythema.  Eyes:     General: No scleral icterus.        Right eye: No discharge.        Left eye: No discharge.     Conjunctiva/sclera: Conjunctivae normal.     Pupils: Pupils are equal, round, and reactive to light.  Neck:     Vascular: No carotid bruit.  Cardiovascular:     Rate and Rhythm: Normal rate and regular rhythm.     Pulses: Normal pulses.     Heart sounds: Normal heart sounds.  Pulmonary:     Effort: Pulmonary effort is normal. No respiratory distress.     Breath sounds: Normal breath sounds.  Abdominal:     General: Abdomen is flat. Bowel sounds are normal.     Palpations: Abdomen is soft.  Musculoskeletal:        General: Normal range of motion.     Cervical back: Normal range of motion.  Skin:    General: Skin is warm and dry.  Neurological:     General: No focal deficit present.     Mental Status: She is alert and oriented to person, place, and time. Mental status is at baseline.  Psychiatric:        Mood and Affect: Mood normal.        Behavior: Behavior normal.        Thought Content: Thought content normal.        Judgment: Judgment normal.     Diabetic Foot Exam - Simple   Simple Foot Form Diabetic Foot exam was performed with the following findings: Yes 10/21/2022  8:46 AM  Visual Inspection No deformities, no ulcerations, no other skin breakdown bilaterally: Yes Sensation Testing Intact to touch and monofilament testing bilaterally: Yes Pulse Check Posterior Tibialis and Dorsalis pulse intact bilaterally: Yes Comments      Impression and Plan:  Encounter for preventive health examination  Controlled type 2 diabetes mellitus without complication, without long-term current use of insulin ([place]) - Plan: CBC with Differential/Platelet, Comprehensive metabolic panel, Hemoglobin A1c, Urine microalbumin-creatinine with uACR, tirzepatide (MOUNJARO) 2.5 MG/0.5ML Pen  Hyperlipidemia associated with type 2 diabetes mellitus ([place]) - Plan: Lipid panel  Vitamin B12 deficiency - Plan: Vitamin B12  Primary  hyperparathyroidism ([place])  Class 1 obesity due to excess calories with serious comorbidity and body mass index (BMI) of 30.0 to 30.9 in adult - Plan: TSH, Vitamin D, 25-hydroxy  -Recommend routine eye and dental care. -Healthy lifestyle discussed in detail. -Labs to be updated today. -Prostate cancer screening: N/A Health Maintenance  Topic Date Due   Yearly kidney health urinalysis for diabetes  09/11/2022   Eye exam for diabetics  10/21/2022*   COVID-19 Vaccine (6 - 2023-24 season) 11/06/2022*   Yearly kidney function blood test for diabetes  11/26/2022  Hemoglobin A1C  02/23/2023   Complete foot exam   10/21/2023   DTaP/Tdap/Td vaccine (2 - Td or Tdap) 05/11/2025   Pneumonia Vaccine  Completed   Flu Shot  Completed   DEXA scan (bone density measurement)  Completed   Hepatitis C Screening: USPSTF Recommendation to screen - Ages 68-79 yo.  Completed   Zoster (Shingles) Vaccine  Completed   HPV Vaccine  Aged Out   Colon Cancer Screening  Discontinued  *Topic was postponed. The date shown is not the original due date.         [name], MD [facility]

## 2022-10-28 ENCOUNTER — Other Ambulatory Visit: Payer: Self-pay

## 2022-10-28 ENCOUNTER — Other Ambulatory Visit (HOSPITAL_COMMUNITY): Payer: Self-pay

## 2022-10-29 ENCOUNTER — Ambulatory Visit: Payer: BC Managed Care – PPO | Admitting: Adult Health

## 2022-10-29 ENCOUNTER — Encounter: Payer: Self-pay | Admitting: Adult Health

## 2022-10-29 VITALS — BP 120/80 | HR 80 | Temp 97.6°F | Ht 64.0 in | Wt 168.0 lb

## 2022-10-29 DIAGNOSIS — T148XXA Other injury of unspecified body region, initial encounter: Secondary | ICD-10-CM

## 2022-10-29 DIAGNOSIS — S29012A Strain of muscle and tendon of back wall of thorax, initial encounter: Secondary | ICD-10-CM | POA: Diagnosis not present

## 2022-10-29 LAB — HM DIABETES EYE EXAM

## 2022-10-29 NOTE — Progress Notes (Signed)
Subjective:    Patient ID: Kathy Herman, female    DOB: 07/21/1946, 77 y.o.   MRN: [id number]  HPI 77 year old female who  has a past medical history of Allergic rhinitis, cause unspecified (05/31/2007), CERVICAL RADICULOPATHY, RIGHT (01/07/2010), Diabetes mellitus without complication ([place]), GERD (07/13/2007), Hypercalcemia, and Hyperlipidemia.  She presents tot he office today for an acute issue. She reports pain under her left breast that radiates around to her left flank. Pain is felt as a soreness. Pain is present all the time but is exacerbated with twisting or turning motions. Pain is worse in back.   Denies injury. no bruising or rash. Does do yoga and feels as though she may have pulled a muscle.  Has not had any breast pain.   At home she has been using a heating pad which helps      Review of Systems See HPI   Past Medical History:  Diagnosis Date   Allergic rhinitis, cause unspecified 05/31/2007   CERVICAL RADICULOPATHY, RIGHT 01/07/2010   Diabetes mellitus without complication ([place])    dx 2019   GERD 07/13/2007   reports hx of reflux that was causing some dysphagia, reports now improved     Hypercalcemia    has since stopped supplemnetal calcium and following pcp for mgt    Hyperlipidemia     Social History   Socioeconomic History   Marital status: Married    Spouse name: Not on file   Number of children: Not on file   Years of education: Not on file   Highest education level: Not on file  Occupational History   Not on file  Tobacco Use   Smoking status: Never   Smokeless tobacco: Never  Substance and Sexual Activity   Alcohol use: Yes    Comment: gin and tonic everyday after work    Drug use: Not on file   Sexual activity: Not on file  Other Topics Concern   Not on file  Social History Narrative   Not on file   Social Determinants of Health   Financial Resource Strain: Not on file  Food Insecurity: Not on file  Transportation Needs: Not on  file  Physical Activity: Not on file  Stress: Not on file  Social Connections: Not on file  Intimate Partner Violence: Not on file    Past Surgical History:  Procedure Laterality Date   BLEPHAROPLASTY  2016   CATARACT EXTRACTION Bilateral 2021   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     Cholecalciferol (VITAMIN D) 50 MCG (2000 UT) CAPS Take by mouth.     cyanocobalamin (VITAMIN B12) 1000 MCG/ML injection Inject 1,000 mcg into the muscle once.     FINASTERIDE PO Take 5 mg by mouth daily.     gabapentin (NEURONTIN) 100 MG capsule at bedtime  as needed.     Magnesium 250 MG TABS Take by mouth.     metFORMIN (GLUCOPHAGE-XR) 500 MG 24 hr tablet TAKE 2 TABLETS(1000 MG) BY MOUTH DAILY WITH BREAKFAST 180 tablet 1   pantoprazole (PROTONIX) 40 MG tablet TAKE 1 TABLET(40 MG) BY MOUTH DAILY 90 tablet 1   simvastatin (ZOCOR) 40 MG tablet TAKE 1 TABLET(40 MG) BY MOUTH AT BEDTIME 90 tablet 1   tirzepatide (MOUNJARO) 2.5 MG/0.5ML Pen Inject 2.5 mg into the skin once a week. 6 mL 0   tiZANidine (ZANAFLEX) 2 MG tablet Take 2 mg by mouth at bedtime as needed.     Vitamin D, Ergocalciferol, (DRISDOL) 1.25 MG (50000 UNIT) CAPS capsule Take 1 capsule (50,000 Units total) by mouth every 7 (seven) days for 12 doses. 12 capsule 0   No current facility-administered medications on file prior to visit.    BP 120/80   Pulse 80    Temp 97.6 F (36.4 C) (Oral)   Ht 5\' 4"  (1.626 m)   Wt 168 lb (76.2 kg)   SpO2 95%   BMI 28.84 kg/m       Objective:   Physical Exam Vitals and nursing note reviewed.  Constitutional:      Appearance: Normal appearance.  Musculoskeletal:        General: Tenderness present.     Comments: Tenderness noted along left thoracic paraspinal muscle group as well as latissimus dorsi. She had some mild discomfort in left upper abdomen with palpation.   Neurological:     General: No focal deficit present.     Mental Status: She is alert and oriented to person, place, and time.  Psychiatric:        Mood and Affect: Mood normal.        Behavior: Behavior normal.        Thought Content: Thought content normal.       Assessment & Plan:  1. Muscle strain - appears to be muscular in nature.  - She has tizanidine at home and will have her use this daily x 5 nights - Will have her use Motrin 400 mg TID x 5 days  - Continue with heating pad and perform gentle stretching exercises   Dorothyann Peng, NP

## 2022-10-29 NOTE — Patient Instructions (Addendum)
It was great meeting you today   I think you have a muscle strain   I would take Motrin 400 mg three times a day x 5 days   I would also like for you to use your Tizanidine nightly x 5 days   Use a heating pad and gentle stretching exercises

## 2022-11-17 ENCOUNTER — Ambulatory Visit: Payer: BC Managed Care – PPO

## 2022-11-20 ENCOUNTER — Ambulatory Visit (INDEPENDENT_AMBULATORY_CARE_PROVIDER_SITE_OTHER): Payer: BC Managed Care – PPO | Admitting: *Deleted

## 2022-11-20 DIAGNOSIS — E538 Deficiency of other specified B group vitamins: Secondary | ICD-10-CM | POA: Diagnosis not present

## 2022-11-20 MED ORDER — CYANOCOBALAMIN 1000 MCG/ML IJ SOLN
1000.0000 ug | Freq: Once | INTRAMUSCULAR | Status: AC
  Administered 2022-11-20: 1000 ug via INTRAMUSCULAR

## 2022-11-20 NOTE — Progress Notes (Signed)
Per orders of Dr. Hernandez, injection of B12 given by Elianys Conry. Patient tolerated injection well.  

## 2022-11-21 ENCOUNTER — Other Ambulatory Visit: Payer: Self-pay | Admitting: Internal Medicine

## 2022-11-21 DIAGNOSIS — E1169 Type 2 diabetes mellitus with other specified complication: Secondary | ICD-10-CM

## 2022-11-25 ENCOUNTER — Other Ambulatory Visit (HOSPITAL_COMMUNITY): Payer: Self-pay

## 2022-11-27 ENCOUNTER — Other Ambulatory Visit (HOSPITAL_COMMUNITY): Payer: Self-pay

## 2022-11-28 ENCOUNTER — Other Ambulatory Visit (HOSPITAL_COMMUNITY): Payer: Self-pay

## 2022-12-01 ENCOUNTER — Telehealth: Payer: Self-pay | Admitting: Internal Medicine

## 2022-12-01 DIAGNOSIS — E119 Type 2 diabetes mellitus without complications: Secondary | ICD-10-CM

## 2022-12-01 NOTE — Telephone Encounter (Signed)
Patient states that no pharmacy has the 5 mg.  She would like a prescription of the 2.5 or the 7 mg.

## 2022-12-01 NOTE — Telephone Encounter (Signed)
Pt call and stated her pharmacy don't have tirzepatide [facility]) 2.5 MG/0.5ML Pen and she have call 15 places and they don't have it she want to know what she need to do also stated they have the 7 and want to know if she want her to have the 7.Pt stated she want a call back.

## 2022-12-02 ENCOUNTER — Other Ambulatory Visit (HOSPITAL_COMMUNITY): Payer: Self-pay

## 2022-12-02 MED ORDER — TIRZEPATIDE 2.5 MG/0.5ML ~~LOC~~ SOAJ
2.5000 mg | SUBCUTANEOUS | 0 refills | Status: DC
  Filled 2022-12-02 – 2022-12-11 (×4): qty 2, 28d supply, fill #0

## 2022-12-02 NOTE — Telephone Encounter (Signed)
2.5 mg refill was sent.

## 2022-12-03 ENCOUNTER — Other Ambulatory Visit (HOSPITAL_COMMUNITY): Payer: Self-pay

## 2022-12-03 ENCOUNTER — Telehealth: Payer: Self-pay | Admitting: Internal Medicine

## 2022-12-03 NOTE — Telephone Encounter (Signed)
PA has been started for [facility]: [id number]

## 2022-12-03 NOTE — Telephone Encounter (Signed)
Patient states the pharmacy says they do not have the prescription for tirzepatide [facility]) 2.5 MG/0.5ML Pen [facility] Phone: [phone]  Fax: [phone]  Was written 12/02/22

## 2022-12-04 NOTE — Telephone Encounter (Signed)
Your information has been sent to [facility].

## 2022-12-05 ENCOUNTER — Other Ambulatory Visit: Payer: Self-pay | Admitting: Internal Medicine

## 2022-12-08 ENCOUNTER — Ambulatory Visit: Payer: BC Managed Care – PPO | Admitting: Internal Medicine

## 2022-12-08 ENCOUNTER — Telehealth: Payer: Self-pay | Admitting: Internal Medicine

## 2022-12-08 ENCOUNTER — Other Ambulatory Visit (HOSPITAL_COMMUNITY): Payer: Self-pay

## 2022-12-08 NOTE — Telephone Encounter (Signed)
Pt calling in to check on mounjaro PA. Advised it was sent to [facility] and she should reach out to them for an update

## 2022-12-08 NOTE — Telephone Encounter (Signed)
Last office note and labs was faxed and confirmed.

## 2022-12-08 NOTE — Telephone Encounter (Signed)
Says they sent over request for additional info (medical records, fax loaded 12/08/22). Asking that the request be expedited as patient has not had meds for 2 weeks

## 2022-12-08 NOTE — Telephone Encounter (Signed)
[facility] [place] has not yet replied to your PA request.

## 2022-12-09 ENCOUNTER — Other Ambulatory Visit (HOSPITAL_COMMUNITY): Payer: Self-pay

## 2022-12-10 ENCOUNTER — Other Ambulatory Visit (HOSPITAL_COMMUNITY): Payer: Self-pay

## 2022-12-10 NOTE — Telephone Encounter (Signed)
Spoke to a rep at [employer].  They have received the fax and we should hear something by 12/11/22.

## 2022-12-11 ENCOUNTER — Other Ambulatory Visit (HOSPITAL_COMMUNITY): Payer: Self-pay

## 2022-12-11 ENCOUNTER — Telehealth: Payer: Self-pay | Admitting: Internal Medicine

## 2022-12-11 NOTE — Telephone Encounter (Signed)
Left a message for the patient to return my call.  

## 2022-12-11 NOTE — Telephone Encounter (Signed)
Patient Advocate Encounter  Prior Authorization for Mounjaro 2.5MG /0.5ML pen-injectors has been approved through [employer].    Key: [id number]  Effective: 12-10-2022 to 12-04-2023

## 2022-12-11 NOTE — Telephone Encounter (Signed)
Outcome Approved on May 8 Approved. Authorization Expiration Date: 12/04/2023 Drug Mounjaro 2.5MG /0.5ML pen-injectors ePA cloud logo Form [employer] [place] Commercial Electronic Request Form

## 2022-12-11 NOTE — Telephone Encounter (Signed)
[facility] is calling to let Kathy Herman know tirzepatide [facility]) 2.5 MG/0.5ML Pen  is not going through PA is still needed

## 2022-12-12 ENCOUNTER — Other Ambulatory Visit (HOSPITAL_COMMUNITY): Payer: Self-pay

## 2022-12-12 NOTE — Telephone Encounter (Signed)
  [facility] Phone: [phone]  Fax: [phone]     FYI:  Increase for tirzepatide [facility])  Rx must be written for 5 mg and resent  Informed MD is OOO on Fridays.  They are asking if another provider can assist?

## 2022-12-12 NOTE — Telephone Encounter (Signed)
[facility] is calling and PA is not going through

## 2022-12-12 NOTE — Telephone Encounter (Signed)
Pt husband came in very irritate demanding that this rx be filled today. I explained to him that a note has been sent to the Dr to contact the pharm for it to be filled and that Dr. Ardyth Harps is out of the office and will get this note on Mon.   Pt husband demanded that someone take care of this today and tried to bully me and my FD staff to get this done today. After he didn't get his way he then threaten me stating that if it is not done on Monday he will be coming up here angrier then he is today because he has been waiting for 3 weeks.   I have explained to him several times the process and that we had to get a PA first before we were able to do anything but he kept assisting that it was our fault why it is taking so long.   Pt husband eventually left angry and upset.   Please advise.

## 2022-12-15 ENCOUNTER — Other Ambulatory Visit (HOSPITAL_COMMUNITY): Payer: Self-pay

## 2022-12-15 MED ORDER — TIRZEPATIDE 5 MG/0.5ML ~~LOC~~ SOAJ
5.0000 mg | SUBCUTANEOUS | 0 refills | Status: DC
  Filled 2022-12-15: qty 2, 28d supply, fill #0

## 2022-12-15 NOTE — Telephone Encounter (Signed)
Spoke to pharmacist and insurance will only cover 5 mg. Rx sent  Husband is aware

## 2022-12-17 ENCOUNTER — Ambulatory Visit (INDEPENDENT_AMBULATORY_CARE_PROVIDER_SITE_OTHER): Payer: BC Managed Care – PPO

## 2022-12-17 DIAGNOSIS — E538 Deficiency of other specified B group vitamins: Secondary | ICD-10-CM | POA: Diagnosis not present

## 2022-12-17 MED ORDER — CYANOCOBALAMIN 1000 MCG/ML IJ SOLN
1000.0000 ug | Freq: Once | INTRAMUSCULAR | Status: AC
Start: 2022-12-17 — End: 2022-12-17
  Administered 2022-12-17: 1000 ug via INTRAMUSCULAR

## 2022-12-17 NOTE — Progress Notes (Signed)
Per orders of Dr. Hernandez, injection of Cyanocobalamin 1000 mcg given by Andreya Lacks L Mehreen Azizi. Patient tolerated injection well.  

## 2023-01-09 ENCOUNTER — Telehealth: Payer: Self-pay | Admitting: Internal Medicine

## 2023-01-09 NOTE — Telephone Encounter (Signed)
Prescription Request  01/09/2023  LOV: 10/21/2022  What is the name of the medication or equipment? tirzepatide [facility]) 5 MG/0.5ML Pen   Have you contacted your pharmacy to request a refill? No   Which pharmacy would you like this sent to?  [id number] - [place], [place] - [address] AT [facility] [address] Phone: [phone]  Fax: [phone]     Patient notified that their request is being sent to the clinical staff for review and that they should receive a response within 2 business days.   Please advise at Mobile [phone] (mobile)

## 2023-01-12 ENCOUNTER — Other Ambulatory Visit: Payer: Self-pay | Admitting: Internal Medicine

## 2023-01-12 DIAGNOSIS — E119 Type 2 diabetes mellitus without complications: Secondary | ICD-10-CM

## 2023-01-12 MED ORDER — TIRZEPATIDE 5 MG/0.5ML ~~LOC~~ SOAJ
5.0000 mg | SUBCUTANEOUS | 0 refills | Status: DC
Start: 2023-01-12 — End: 2023-02-04

## 2023-01-13 NOTE — Telephone Encounter (Signed)
Refill sent.

## 2023-01-16 ENCOUNTER — Ambulatory Visit (INDEPENDENT_AMBULATORY_CARE_PROVIDER_SITE_OTHER): Payer: BC Managed Care – PPO | Admitting: *Deleted

## 2023-01-16 DIAGNOSIS — E538 Deficiency of other specified B group vitamins: Secondary | ICD-10-CM

## 2023-01-16 MED ORDER — CYANOCOBALAMIN 1000 MCG/ML IJ SOLN
1000.0000 ug | Freq: Once | INTRAMUSCULAR | Status: AC
Start: 2023-01-16 — End: 2023-01-16
  Administered 2023-01-16: 1000 ug via INTRAMUSCULAR

## 2023-01-16 NOTE — Progress Notes (Signed)
Per orders of Dr. Michael, injection of Cyanocobalamin 1000mcg given by Ralene Gasparyan A. Patient tolerated injection well.  

## 2023-01-27 ENCOUNTER — Other Ambulatory Visit: Payer: Self-pay | Admitting: Internal Medicine

## 2023-01-27 DIAGNOSIS — E559 Vitamin D deficiency, unspecified: Secondary | ICD-10-CM

## 2023-02-04 ENCOUNTER — Ambulatory Visit: Payer: BC Managed Care – PPO | Admitting: Internal Medicine

## 2023-02-04 ENCOUNTER — Encounter: Payer: Self-pay | Admitting: Internal Medicine

## 2023-02-04 VITALS — BP 100/70 | HR 71 | Temp 97.5°F | Ht 64.0 in | Wt 158.0 lb

## 2023-02-04 DIAGNOSIS — E785 Hyperlipidemia, unspecified: Secondary | ICD-10-CM | POA: Diagnosis not present

## 2023-02-04 DIAGNOSIS — E119 Type 2 diabetes mellitus without complications: Secondary | ICD-10-CM

## 2023-02-04 DIAGNOSIS — Z7985 Long-term (current) use of injectable non-insulin antidiabetic drugs: Secondary | ICD-10-CM

## 2023-02-04 DIAGNOSIS — E210 Primary hyperparathyroidism: Secondary | ICD-10-CM

## 2023-02-04 DIAGNOSIS — E559 Vitamin D deficiency, unspecified: Secondary | ICD-10-CM | POA: Diagnosis not present

## 2023-02-04 DIAGNOSIS — E1169 Type 2 diabetes mellitus with other specified complication: Secondary | ICD-10-CM

## 2023-02-04 LAB — POCT GLYCOSYLATED HEMOGLOBIN (HGB A1C): Hemoglobin A1C: 5.8 % — AB (ref 4.0–5.6)

## 2023-02-04 LAB — VITAMIN D 25 HYDROXY (VIT D DEFICIENCY, FRACTURES): VITD: 50.13 ng/mL (ref 30.00–100.00)

## 2023-02-04 MED ORDER — TIRZEPATIDE 7.5 MG/0.5ML ~~LOC~~ SOAJ
7.5000 mg | SUBCUTANEOUS | 0 refills | Status: DC
Start: 2023-02-04 — End: 2023-04-07

## 2023-02-04 NOTE — Assessment & Plan Note (Signed)
Has follow-up with endocrinology in September.

## 2023-02-04 NOTE — Assessment & Plan Note (Signed)
Well-controlled with an A1c of 5.8 today.  She has been tolerating Mounjaro 5 mg well.  Will increase to 7.5 mg today.

## 2023-02-04 NOTE — Assessment & Plan Note (Signed)
Related to hyperparathyroidism.  Being followed by endocrinology.

## 2023-02-04 NOTE — Progress Notes (Signed)
Established Patient Office Visit     CC/Reason for Visit: 21-month follow-up chronic medical conditions  HPI: Kathy Herman is a 77 y.o. female who is coming in today for the above mentioned reasons. Past Medical History is significant for: Hyperlipidemia, type 2 diabetes, GERD, hyperparathyroidism with hypercalcemia, vitamin B12 deficiency, vitamin D deficiency and obesity.  She is feeling well and has no acute concerns or complaints.  She has lost 10 pounds since starting [place].  She is tolerating 5 mg dose well.   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 She has never used smokeless tobacco. She reports current alcohol use. No history on file for drug use.  Allergies: Allergies  Allergen Reactions   Erythromycin Nausea Only    Family History:  Family History  Problem Relation Age of Onset   Memory loss Mother    Kidney Stones Father    Congestive Heart Failure Father      Current Outpatient Medications:    CELEBREX 200 MG capsule, Take 200 mg by mouth at bedtime as needed., Disp: , Rfl:    Cholecalciferol (VITAMIN D) 50 MCG (2000 UT) CAPS, Take by  mouth., Disp: , Rfl:    cyanocobalamin (VITAMIN B12) 1000 MCG/ML injection, Inject 1,000 mcg into the muscle once., Disp: , Rfl:    FINASTERIDE PO, Take 5 mg by mouth daily., Disp: , Rfl:    gabapentin (NEURONTIN) 100 MG capsule, at bedtime as needed., Disp: , Rfl:    Magnesium 250 MG TABS, Take by mouth., Disp: , Rfl:    metFORMIN (GLUCOPHAGE-XR) 500 MG 24 hr tablet, TAKE 2 TABLETS(1000 MG) BY MOUTH DAILY WITH BREAKFAST, Disp: 180 tablet, Rfl: 1   pantoprazole (PROTONIX) 40 MG tablet, TAKE 1 TABLET(40 MG) BY MOUTH DAILY, Disp: 90 tablet, Rfl: 1   simvastatin (ZOCOR) 40 MG tablet, TAKE 1 TABLET(40 MG) BY MOUTH AT BEDTIME, Disp: 90 tablet, Rfl: 1   tirzepatide (MOUNJARO) 7.5 MG/0.5ML Pen, Inject 7.5 mg into the skin once a week., Disp: 6 mL, Rfl: 0   tiZANidine (ZANAFLEX) 2 MG tablet, Take 2 mg by mouth at bedtime as needed., Disp: , Rfl:   Review of Systems:  Negative unless indicated in HPI.   Physical Exam: Vitals:   02/04/23 0731  BP: 100/70  Pulse: 71  Temp: (!) 97.5 F (36.4 C)  TempSrc: Oral  SpO2: 97%  Weight: 158 lb (71.7 kg)  Height: 5\' 4"  (1.626 m)    Body mass index is 27.12 kg/m.   Physical Exam Vitals reviewed.  Constitutional:      Appearance: Normal appearance.  HENT:  Head: Normocephalic and atraumatic.  Eyes:     Conjunctiva/sclera: Conjunctivae normal.     Pupils: Pupils are equal, round, and reactive to light.  Cardiovascular:     Rate and Rhythm: Normal rate and regular rhythm.  Pulmonary:     Effort: Pulmonary effort is normal.     Breath sounds: Normal breath sounds.  Skin:    General: Skin is warm and dry.  Neurological:     General: No focal deficit present.     Mental Status: She is alert and oriented to person, place, and time.  Psychiatric:        Mood and Affect: Mood normal.        Behavior: Behavior normal.        Thought Content: Thought content normal.        Judgment: Judgment normal.      Impression and  Plan:  Controlled type 2 diabetes mellitus without complication, without long-term current use of insulin (HCC) Assessment & Plan: Well-controlled with an A1c of 5.8 today.  She has been tolerating Mounjaro 5 mg well.  Will increase to 7.5 mg today.  Orders: -     POCT glycosylated hemoglobin (Hb A1C) -     Tirzepatide; Inject 7.5 mg into the skin once a week.  Dispense: 6 mL; Refill: 0  Hyperlipidemia associated with type 2 diabetes mellitus (HCC) Assessment & Plan: Well-controlled with an LDL of 63 as of March 2024.  Currently on simvastatin 40 mg daily.   Primary hyperparathyroidism [facility]) Assessment & Plan: Has follow-up with endocrinology in September.   Hypercalcemia Assessment & Plan: Related to hyperparathyroidism.  Being followed by endocrinology.   Vitamin D deficiency Assessment & Plan: Due for recheck today after completing 3 months of high-dose supplementation.  Orders: -     VITAMIN D 25 Hydroxy (Vit-D Deficiency, Fractures)     Time spent:31 minutes reviewing chart, interviewing and examining patient and formulating plan of care.     [name], MD [facility]

## 2023-02-04 NOTE — Assessment & Plan Note (Signed)
Due for recheck today after completing 3 months of high-dose supplementation.

## 2023-02-04 NOTE — Assessment & Plan Note (Signed)
Well-controlled with an LDL of 63 as of March 2024.  Currently on simvastatin 40 mg daily.

## 2023-02-04 NOTE — Patient Instructions (Signed)
Health Maintenance Due  Topic Date Due   COVID-19 Vaccine (6 - 2023-24 season) 04/04/2022      Row Labels 10/21/2022    8:38 AM 08/25/2022    8:45 AM 05/22/2022   10:12 AM  Depression screen PHQ 2/9   Section Header. No data exists in this row.     Decreased Interest   0 0 0  Down, Depressed, Hopeless   0 0 0  PHQ - 2 Score   0 0 0  Altered sleeping   2 0 0  Tired, decreased energy   0 0 0  Change in appetite   0 0 0  Feeling bad or failure about yourself    0 0 0  Trouble concentrating   0 0 0  Moving slowly or fidgety/restless   0 0 0  Suicidal thoughts   0 0 0  PHQ-9 Score   2 0 0  Difficult doing work/chores   Somewhat difficult Not difficult at all Not difficult at all

## 2023-02-16 ENCOUNTER — Ambulatory Visit (INDEPENDENT_AMBULATORY_CARE_PROVIDER_SITE_OTHER): Payer: BC Managed Care – PPO

## 2023-02-16 DIAGNOSIS — E538 Deficiency of other specified B group vitamins: Secondary | ICD-10-CM | POA: Diagnosis not present

## 2023-02-16 MED ORDER — CYANOCOBALAMIN 1000 MCG/ML IJ SOLN
1000.0000 ug | Freq: Once | INTRAMUSCULAR | Status: AC
Start: 2023-02-16 — End: 2023-02-16
  Administered 2023-02-16: 1000 ug via INTRAMUSCULAR

## 2023-02-16 NOTE — Progress Notes (Signed)
Pt here for monthly B12 injection per Dr Hernandez  B12 1000mcg given IM and pt tolerated injection well.   

## 2023-03-02 ENCOUNTER — Telehealth: Payer: Self-pay | Admitting: *Deleted

## 2023-03-02 NOTE — Telephone Encounter (Signed)
Patient and husband are travelling to [place] 04/14/23.  Patient is requesting a prescription for travelers diarrhea and anything else Dr Ardyth Harps feels that she might need.

## 2023-03-04 ENCOUNTER — Encounter (INDEPENDENT_AMBULATORY_CARE_PROVIDER_SITE_OTHER): Payer: Self-pay

## 2023-03-05 ENCOUNTER — Other Ambulatory Visit: Payer: Self-pay | Admitting: Internal Medicine

## 2023-03-05 DIAGNOSIS — K219 Gastro-esophageal reflux disease without esophagitis: Secondary | ICD-10-CM

## 2023-03-05 NOTE — Telephone Encounter (Signed)
Patient is aware and message sent through MyChart.

## 2023-03-09 LAB — HM MAMMOGRAPHY

## 2023-03-19 ENCOUNTER — Encounter (INDEPENDENT_AMBULATORY_CARE_PROVIDER_SITE_OTHER): Payer: Self-pay

## 2023-03-19 ENCOUNTER — Ambulatory Visit: Payer: BC Managed Care – PPO

## 2023-03-19 DIAGNOSIS — E538 Deficiency of other specified B group vitamins: Secondary | ICD-10-CM

## 2023-03-19 MED ORDER — CYANOCOBALAMIN 1000 MCG/ML IJ SOLN
1000.0000 ug | Freq: Once | INTRAMUSCULAR | Status: AC
Start: 2023-03-19 — End: 2023-03-19
  Administered 2023-03-19: 1000 ug via INTRAMUSCULAR

## 2023-03-19 NOTE — Progress Notes (Signed)
Per orders of Dr. Michael, injection of Cyanocobalamin 1000 mcg given by Mykal L Good. Patient tolerated injection well.  

## 2023-04-07 ENCOUNTER — Telehealth: Payer: Self-pay | Admitting: Internal Medicine

## 2023-04-07 DIAGNOSIS — E119 Type 2 diabetes mellitus without complications: Secondary | ICD-10-CM

## 2023-04-07 MED ORDER — TIRZEPATIDE 7.5 MG/0.5ML ~~LOC~~ SOAJ: 7.5000 mg | SUBCUTANEOUS | 0 refills | Status: DC

## 2023-04-07 NOTE — Telephone Encounter (Signed)
Pt called and need a refill sent to:  tirzepatide [facility]) 7.5 MG/0.5ML Pen   [facility] #[id number] - [place], [place] - [address] AT [facility] OF [address] Phone: [phone]  Fax: [phone]     Please advise pt.

## 2023-04-07 NOTE — Telephone Encounter (Signed)
Refill sent.

## 2023-04-13 ENCOUNTER — Ambulatory Visit (INDEPENDENT_AMBULATORY_CARE_PROVIDER_SITE_OTHER): Payer: BC Managed Care – PPO

## 2023-04-13 DIAGNOSIS — E538 Deficiency of other specified B group vitamins: Secondary | ICD-10-CM | POA: Diagnosis not present

## 2023-04-13 MED ORDER — CYANOCOBALAMIN 1000 MCG/ML IJ SOLN
1000.0000 ug | Freq: Once | INTRAMUSCULAR | Status: AC
Start: 2023-04-13 — End: 2023-04-13
  Administered 2023-04-13: 1000 ug via INTRAMUSCULAR

## 2023-04-13 NOTE — Progress Notes (Signed)
Per orders of Dr. Ardyth Harps, injection of B12 given by Vickii Chafe on Left deltoid.  Patient tolerated injection well.

## 2023-04-17 ENCOUNTER — Ambulatory Visit: Payer: BC Managed Care – PPO

## 2023-05-07 ENCOUNTER — Ambulatory Visit: Payer: BC Managed Care – PPO | Admitting: Internal Medicine

## 2023-05-12 ENCOUNTER — Encounter: Payer: Self-pay | Admitting: Internal Medicine

## 2023-05-12 ENCOUNTER — Ambulatory Visit: Payer: BC Managed Care – PPO | Admitting: Internal Medicine

## 2023-05-12 VITALS — BP 120/70 | HR 47 | Ht 64.0 in | Wt 155.8 lb

## 2023-05-12 DIAGNOSIS — E210 Primary hyperparathyroidism: Secondary | ICD-10-CM | POA: Diagnosis not present

## 2023-05-12 LAB — COMPREHENSIVE METABOLIC PANEL
ALT: 74 U/L — ABNORMAL HIGH (ref 0–35)
AST: 51 U/L — ABNORMAL HIGH (ref 0–37)
Albumin: 4.2 g/dL (ref 3.5–5.2)
Alkaline Phosphatase: 92 U/L (ref 39–117)
BUN: 17 mg/dL (ref 6–23)
CO2: 25 meq/L (ref 19–32)
Calcium: 10.9 mg/dL — ABNORMAL HIGH (ref 8.4–10.5)
Chloride: 109 meq/L (ref 96–112)
Creatinine, Ser: 0.66 mg/dL (ref 0.40–1.20)
GFR: 84.79 mL/min (ref >60.00)
Glucose, Bld: 91 mg/dL (ref 70–99)
Potassium: 4.7 meq/L (ref 3.5–5.1)
Sodium: 142 meq/L (ref 135–145)
Total Bilirubin: 0.4 mg/dL (ref 0.2–1.2)
Total Protein: 6.1 g/dL (ref 6.0–8.3)

## 2023-05-12 LAB — VITAMIN D 25 HYDROXY (VIT D DEFICIENCY, FRACTURES): VITD: 35.89 ng/mL (ref 30.00–100.00)

## 2023-05-12 LAB — ALBUMIN: Albumin: 4.2 g/dL (ref 3.5–5.2)

## 2023-05-12 NOTE — Patient Instructions (Signed)
-   Stay hydrated  ?- Continue to avoid over the counter calcium supplements ?- Continue to consume 2-3 servings of calcium in your diet daily  ? ? ? ? ? ?24-Hour Urine Collection ? ?You will be collecting your urine for a 24-hour period of time. ?Your timer starts with your first urine of the morning (For example - If you first pee at [place], your timer will start at [place]) ?Throw away your first urine of the morning ?Collect your urine every time you pee for the next 24 hours ?STOP your urine collection 24 hours after you started the collection (For example - You would stop at 9AM the day after you started) ? ?

## 2023-05-12 NOTE — Progress Notes (Unsigned)
Name: Kathy Herman  MRN/ DOB: [id number], 10/06/1945    Age/ Sex: 77 y.o., female     PCP: Philip Aspen, Limmie Patricia, MD   Reason for Endocrinology Evaluation: Hypercalcemia      Initial Endocrinology Clinic Visit: 10/26/2018    PATIENT IDENTIFIER: Kathy Herman is a 77 y.o., female with a past medical history of T2DM, Dyslipidemia and DJD. She has followed with [facility] since 10/26/2018  for consultative assistance with management of her hypercalcemia.   HISTORICAL SUMMARY: The patient was first diagnosed with hypercalcemia in 08/2018.   Ca/Cr ratio is 0.024 which is consistent with Primary hyperparathyroidism 02/2019 at 288 mg, repeat 24-hr urine calcium was 176 mg  01/2020 No evidence of renal calculi on KUB 02/2019 DXA shows osteopenia 12/2019 and in 12/2021  SUBJECTIVE:   Today (05/12/2023):  Kathy Herman is here for a follow up on hypercalcemia.     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 year, now has scar tissue with minimal pain No renal stones  No recent falls  She consumes ~ 2 servings of calcium   Vitamin D 2000 iu daily    HISTORY:  Past Medical History:  Past Medical History:  Diagnosis Date   Allergic rhinitis, cause unspecified 05/31/2007   CERVICAL RADICULOPATHY, RIGHT 01/07/2010   Diabetes mellitus without complication (HCC)    dx 2019   GERD 07/13/2007   reports hx of reflux that was causing some dysphagia, reports now improved     Hypercalcemia    has since stopped supplemnetal calcium and following pcp for mgt    Hyperlipidemia    Past Surgical History:  Past Surgical History:  Procedure Laterality Date   BLEPHAROPLASTY  2016   CATARACT EXTRACTION Bilateral 2021   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 12/22/2018   Procedure: Revision right knee uni to total knee arthroplasty;  Surgeon: Ollen Gross, MD;  Location: [facility];  Service: Orthopedics;   Laterality: Right;    MEDIAL PARTIAL KNEE REPLACEMENT Right 2018   TUBAL LIGATION     Social History:  reports that she has never smoked. She has never used smokeless tobacco. She reports current alcohol use. No history on file for drug use. Family History:  Family History  Problem Relation Age of Onset   Memory loss Mother    Kidney Stones Father    Congestive Heart Failure Father      HOME MEDICATIONS: Allergies as of 05/12/2023       Reactions   Erythromycin Nausea Only        Medication List        Accurate as of May 12, 2023 11:52 AM. If you have any questions, ask your nurse or doctor.          CeleBREX 200 MG capsule Generic drug: celecoxib Take 200 mg by mouth at bedtime as needed.   cyanocobalamin 1000 MCG/ML injection Commonly known as: VITAMIN B12 Inject 1,000 mcg into the muscle once.   FINASTERIDE PO Take 5 mg by mouth daily.   gabapentin 100 MG capsule Commonly known as: NEURONTIN at bedtime as needed.   Magnesium 250 MG Tabs Take by mouth.   metFORMIN 500 MG 24 hr tablet Commonly known as: GLUCOPHAGE-XR TAKE 2 TABLETS(1000 MG) BY MOUTH DAILY WITH BREAKFAST   pantoprazole 40 MG tablet Commonly known as: PROTONIX TAKE 1 TABLET(40 MG) BY MOUTH DAILY   simvastatin 40 MG tablet  Commonly known as: ZOCOR TAKE 1 TABLET(40 MG) BY MOUTH AT BEDTIME   tirzepatide 7.5 MG/0.5ML Pen Commonly known as: MOUNJARO Inject 7.5 mg into the skin once a week.   tiZANidine 2 MG tablet Commonly known as: ZANAFLEX Take 2 mg by mouth at bedtime as needed.   Vitamin D 50 MCG (2000 UT) Caps Take by mouth.          OBJECTIVE:   PHYSICAL EXAM: VS: BP 120/70   Pulse (!) 47   Ht 5\' 4"  (1.626 m)   Wt 155 lb 12.8 oz (70.7 kg)   SpO2 (!) 87%   BMI 26.74 kg/m     EXAM: General: Pt appears well and is in NAD  Neck: General: Supple without adenopathy. Thyroid: Thyroid size normal.  No goiter or nodules appreciated. No thyroid bruit.   Lungs: Clear with good BS bilat with no rales, rhonchi, or wheezes  Heart: Auscultation: RRR.  Abdomen: Normoactive bowel sounds, soft, nontender, without masses or organomegaly palpable  Extremities:  BL LE: Trace right pretibial edema normal  Mental Status: Judgment, insight: Intact Orientation: Oriented to time, place, and person Mood and affect: No depression, anxiety, or agitation     DATA REVIEWED:    Latest Reference Range & Units 02/04/23 08:00  VITD 30.00 - 100.00 ng/mL 50.13     ASSESSMENT / PLAN / RECOMMENDATIONS:   Hyperparathyroidism:   - Corrected serum calcium is 10.46 mg/dL, ionized calcium is at the upper limit of normal - PTH is pending today  - Pt has not met criteria for surgical intervention thus far , we will wait on DEXA results -The patient has been diagnosed with stress fractures, which are NOT generally considered fragility fractures - Serum calcium increased ( corrected 11.3 mg/dL ) , will encourage hydration ,as this is unheard of in the post-op period    Recommendations: Encouraged hydration Avoid OTC calcium supplements Maintain 2-3 servings of calcium a day    2.  Vitamin D insufficiency:  -Unfortunately she has not been able to start OTC vitamin D3 because the once she could find all had calcium -I have advised the patient that she may need to look online for plain vitamin D3 -We discussed the importance of vitamin D3 and improving bone and muscle health  Medication Start vitamin D3 2000 IU daily   F/U in 6 months   Signed electronically by: Lyndle Herrlich, MD  [facility]  [facility] [address] [place], [place] [address] Phone: [phone] FAX: [phone]      CC: Philip Aspen, Limmie Patricia, MD [address] [place] [address] Phone: [phone]  Fax: [phone]   Return to Endocrinology clinic as below: Future Appointments  Date Time Provider  Department Center  05/18/2023 11:30 AM Philip Aspen, Limmie Patricia, MD [facility]  05/19/2023  9:00 AM [facility]  06/18/2023  9:00 AM [facility]  07/20/2023  9:00 AM [facility]

## 2023-05-13 LAB — PARATHYROID HORMONE, INTACT (NO CA): PTH: 66 pg/mL (ref 16–77)

## 2023-05-13 LAB — CALCIUM, IONIZED: Calcium, Ion: 5.8 mg/dL — ABNORMAL HIGH (ref 4.7–5.5)

## 2023-05-18 ENCOUNTER — Ambulatory Visit: Payer: BC Managed Care – PPO

## 2023-05-18 ENCOUNTER — Encounter: Payer: Self-pay | Admitting: Internal Medicine

## 2023-05-18 ENCOUNTER — Ambulatory Visit: Payer: BC Managed Care – PPO | Admitting: Internal Medicine

## 2023-05-18 VITALS — BP 110/70 | HR 66 | Temp 97.6°F | Wt 155.7 lb

## 2023-05-18 DIAGNOSIS — E119 Type 2 diabetes mellitus without complications: Secondary | ICD-10-CM

## 2023-05-18 DIAGNOSIS — E1169 Type 2 diabetes mellitus with other specified complication: Secondary | ICD-10-CM

## 2023-05-18 DIAGNOSIS — E785 Hyperlipidemia, unspecified: Secondary | ICD-10-CM

## 2023-05-18 DIAGNOSIS — E210 Primary hyperparathyroidism: Secondary | ICD-10-CM

## 2023-05-18 LAB — POCT GLYCOSYLATED HEMOGLOBIN (HGB A1C): Hemoglobin A1C: 5.5 % (ref 4.0–5.6)

## 2023-05-18 NOTE — Progress Notes (Signed)
Established Patient Office Visit     CC/Reason for Visit: Follow-up chronic conditions  HPI: Kathy Herman is a 77 y.o. female who is coming in today for the above mentioned reasons. Past Medical History is significant for: Hyperlipidemia, type 2 diabetes, hyperparathyroidism and hypercalcemia, GERD, vitamin D and B12 deficiencies.  She has been having issues with her hearing and is following with audiology.  She recently received a flu vaccine.  She will also be seeing her orthopedist in November as she is having some issues with her left knee that was replaced last November.  Otherwise has been feeling well.   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 She has never used smokeless tobacco. She reports current alcohol use. No history on file for drug use.  Allergies: Allergies  Allergen Reactions   Erythromycin Nausea Only    Family History:  Family History  Problem Relation Age of Onset   Memory loss Mother    Kidney Stones Father    Congestive Heart Failure Father      Current Outpatient Medications:    CELEBREX 200 MG capsule, Take 200 mg by mouth at  bedtime as needed., Disp: , Rfl:    Cholecalciferol (VITAMIN D) 50 MCG (2000 UT) CAPS, Take by mouth., Disp: , Rfl:    cyanocobalamin (VITAMIN B12) 1000 MCG/ML injection, Inject 1,000 mcg into the muscle once., Disp: , Rfl:    FINASTERIDE PO, Take 5 mg by mouth daily., Disp: , Rfl:    Magnesium 250 MG TABS, Take by mouth., Disp: , Rfl:    metFORMIN (GLUCOPHAGE-XR) 500 MG 24 hr tablet, TAKE 2 TABLETS(1000 MG) BY MOUTH DAILY WITH BREAKFAST, Disp: 180 tablet, Rfl: 1   pantoprazole (PROTONIX) 40 MG tablet, TAKE 1 TABLET(40 MG) BY MOUTH DAILY, Disp: 90 tablet, Rfl: 1   simvastatin (ZOCOR) 40 MG tablet, TAKE 1 TABLET(40 MG) BY MOUTH AT BEDTIME, Disp: 90 tablet, Rfl: 1   tirzepatide (MOUNJARO) 7.5 MG/0.5ML Pen, Inject 7.5 mg into the skin once a week., Disp: 6 mL, Rfl: 0  Review of Systems:  Negative unless indicated in HPI.   Physical Exam: Vitals:   05/18/23 1135  BP: 110/70  Pulse: 66  Temp: 97.6 F (36.4 C)  TempSrc: Oral  SpO2: 98%  Weight: 155 lb 11.2 oz (70.6 kg)    Body mass index is 26.73 kg/m.   Physical Exam Vitals reviewed.  Constitutional:      Appearance: Normal appearance.  HENT:     Head: Normocephalic and atraumatic.  Eyes:     Conjunctiva/sclera: Conjunctivae normal.     Pupils: Pupils are  equal, round, and reactive to light.  Cardiovascular:     Rate and Rhythm: Normal rate and regular rhythm.  Pulmonary:     Effort: Pulmonary effort is normal.     Breath sounds: Normal breath sounds.  Skin:    General: Skin is warm and dry.  Neurological:     General: No focal deficit present.     Mental Status: She is alert and oriented to person, place, and time.  Psychiatric:        Mood and Affect: Mood normal.        Behavior: Behavior normal.        Thought Content: Thought content normal.        Judgment: Judgment normal.      Impression and Plan:  Controlled type 2 diabetes mellitus without complication, without long-term current use of insulin (HCC) -      POCT glycosylated hemoglobin (Hb A1C)  Hyperlipidemia associated with type 2 diabetes mellitus (HCC)  Hypercalcemia  Primary hyperparathyroidism (HCC)   -A1c demonstrates excellent control at 5.5. -LDL is at goal at 63. -She continues to follow with endocrinology in regards to hypercalcemia and hyperparathyroidism.  Time spent:30 minutes reviewing chart, interviewing and examining patient and formulating plan of care.     [name], MD [facility]

## 2023-05-19 ENCOUNTER — Ambulatory Visit (INDEPENDENT_AMBULATORY_CARE_PROVIDER_SITE_OTHER): Payer: BC Managed Care – PPO

## 2023-05-19 DIAGNOSIS — E538 Deficiency of other specified B group vitamins: Secondary | ICD-10-CM

## 2023-05-19 MED ORDER — CYANOCOBALAMIN 1000 MCG/ML IJ SOLN
1000.0000 ug | Freq: Once | INTRAMUSCULAR | Status: AC
Start: 2023-05-19 — End: 2023-05-19
  Administered 2023-05-19: 1000 ug via INTRAMUSCULAR

## 2023-05-19 NOTE — Progress Notes (Signed)
Per orders of Dr. Ardyth Harps, injection of B-12 given by Stann Ore. Patient tolerated injection well.

## 2023-05-24 ENCOUNTER — Other Ambulatory Visit: Payer: Self-pay | Admitting: Internal Medicine

## 2023-05-24 DIAGNOSIS — E1169 Type 2 diabetes mellitus with other specified complication: Secondary | ICD-10-CM

## 2023-06-18 ENCOUNTER — Ambulatory Visit: Payer: BC Managed Care – PPO | Admitting: *Deleted

## 2023-06-18 DIAGNOSIS — E538 Deficiency of other specified B group vitamins: Secondary | ICD-10-CM | POA: Diagnosis not present

## 2023-06-18 MED ORDER — CYANOCOBALAMIN 1000 MCG/ML IJ SOLN
1000.0000 ug | Freq: Once | INTRAMUSCULAR | Status: AC
Start: 2023-06-18 — End: 2023-06-18
  Administered 2023-06-18: 1000 ug via INTRAMUSCULAR

## 2023-06-18 NOTE — Progress Notes (Signed)
Per orders of Dr. Hernandez, injection of b12 given by Chetan Mehring. Patient tolerated injection well.  

## 2023-06-20 ENCOUNTER — Other Ambulatory Visit: Payer: Self-pay | Admitting: Internal Medicine

## 2023-06-25 NOTE — Telephone Encounter (Signed)
Pt called to say she spoke to Pharmacy and was told they are still waiting on MD.  Pt states she only has about a week's worth left.   [facility] #[id number] [place], [place] - [address] AT [facility] OF [address] Phone: [phone]  Fax: [phone]

## 2023-06-29 ENCOUNTER — Other Ambulatory Visit: Payer: BC Managed Care – PPO

## 2023-06-29 NOTE — Addendum Note (Signed)
Addended by: Pollie Meyer on: 06/29/2023 09:53 AM   Modules accepted: Orders

## 2023-06-30 LAB — CALCIUM, URINE, 24 HOUR: Calcium, 24H Urine: 304 mg/(24.h) — ABNORMAL HIGH

## 2023-06-30 LAB — CREATININE, URINE, 24 HOUR: Creatinine, 24H Ur: 0.93 g/(24.h) (ref 0.50–2.15)

## 2023-07-01 ENCOUNTER — Other Ambulatory Visit: Payer: Self-pay | Admitting: Internal Medicine

## 2023-07-01 NOTE — Telephone Encounter (Signed)
Pt calling to check on progress of this refill. Asking that someone will if provider is not here as she is down to her last pills

## 2023-07-08 ENCOUNTER — Telehealth: Payer: Self-pay

## 2023-07-08 ENCOUNTER — Other Ambulatory Visit: Payer: Self-pay | Admitting: Internal Medicine

## 2023-07-08 DIAGNOSIS — E210 Primary hyperparathyroidism: Secondary | ICD-10-CM

## 2023-07-08 DIAGNOSIS — E119 Type 2 diabetes mellitus without complications: Secondary | ICD-10-CM

## 2023-07-08 MED ORDER — TIRZEPATIDE 7.5 MG/0.5ML ~~LOC~~ SOAJ: 7.5000 mg | SUBCUTANEOUS | 0 refills | Status: DC

## 2023-07-08 NOTE — Telephone Encounter (Signed)
Attempted to call the patient on 07/08/2023 at 1530, left a voicemail

## 2023-07-08 NOTE — Telephone Encounter (Signed)
Spoke to the patient on 07/08/2023 at 1550   Patient with elevated 24-hour urine collection, we discussed surgery versus HCTZ.  The patient has added magnesium tablets to her diet.  It is unclear to me if this has any impact on urinary calcium excretion   We have opted to hold off on starting HCTZ or referring to surgery at this time, patient will discontinue magnesium and recheck in April

## 2023-07-08 NOTE — Telephone Encounter (Signed)
Refill has been sent.  °

## 2023-07-08 NOTE — Telephone Encounter (Signed)
Prescription Request  07/08/2023  LOV: 05/18/2023  What is the name of the medication or equipment?  tirzepatide [facility]) 7.5 MG/0.5ML Pen   Have you contacted your pharmacy to request a refill? No   Which pharmacy would you like this sent to?  [facility] #[id number] [place], [place] - [address] AT [facility] OF [address] Phone: [phone]  Fax: [phone]    Pt want dr. Ardyth Harps to send three mo.  Patient notified that their request is being sent to the clinical staff for review and that they should receive a response within 2 business days.   Please advise at Mobile [phone] (mobile)

## 2023-07-08 NOTE — Telephone Encounter (Signed)
Patient would like a call to discuss labs results and the options. Can make a appointment if needed.

## 2023-07-20 ENCOUNTER — Ambulatory Visit (INDEPENDENT_AMBULATORY_CARE_PROVIDER_SITE_OTHER): Payer: BC Managed Care – PPO

## 2023-07-20 DIAGNOSIS — E538 Deficiency of other specified B group vitamins: Secondary | ICD-10-CM | POA: Diagnosis not present

## 2023-07-20 MED ORDER — CYANOCOBALAMIN 1000 MCG/ML IJ SOLN
1000.0000 ug | Freq: Once | INTRAMUSCULAR | Status: AC
  Administered 2023-07-20: 1000 ug via INTRAMUSCULAR

## 2023-07-20 NOTE — Progress Notes (Signed)
 Per orders of Dr. Ardyth Harps, injection of B12 given by Vickii Chafe on Left Deltoid. Patient tolerated injection well.

## 2023-08-17 ENCOUNTER — Ambulatory Visit: Payer: BC Managed Care – PPO | Admitting: Internal Medicine

## 2023-08-17 ENCOUNTER — Encounter: Payer: Self-pay | Admitting: Internal Medicine

## 2023-08-17 VITALS — BP 110/80 | HR 84 | Temp 97.7°F | Wt 157.6 lb

## 2023-08-17 DIAGNOSIS — Z7985 Long-term (current) use of injectable non-insulin antidiabetic drugs: Secondary | ICD-10-CM

## 2023-08-17 DIAGNOSIS — E210 Primary hyperparathyroidism: Secondary | ICD-10-CM | POA: Diagnosis not present

## 2023-08-17 DIAGNOSIS — K219 Gastro-esophageal reflux disease without esophagitis: Secondary | ICD-10-CM | POA: Diagnosis not present

## 2023-08-17 DIAGNOSIS — E119 Type 2 diabetes mellitus without complications: Secondary | ICD-10-CM

## 2023-08-17 DIAGNOSIS — E559 Vitamin D deficiency, unspecified: Secondary | ICD-10-CM

## 2023-08-17 DIAGNOSIS — E1169 Type 2 diabetes mellitus with other specified complication: Secondary | ICD-10-CM | POA: Diagnosis not present

## 2023-08-17 DIAGNOSIS — E785 Hyperlipidemia, unspecified: Secondary | ICD-10-CM

## 2023-08-17 LAB — POCT GLYCOSYLATED HEMOGLOBIN (HGB A1C): Hemoglobin A1C: 5.6 % (ref 4.0–5.6)

## 2023-08-17 MED ORDER — PANTOPRAZOLE SODIUM 40 MG PO TBEC: 40.0000 mg | DELAYED_RELEASE_TABLET | Freq: Every day | ORAL | 1 refills | Status: DC

## 2023-08-17 NOTE — Assessment & Plan Note (Signed)
 Well-controlled with an A1c of 568 today.  She has been tolerating Mounjaro 7.5 mg well.

## 2023-08-17 NOTE — Progress Notes (Signed)
 Established Patient Office Visit     CC/Reason for Visit: Follow-up chronic medical conditions  HPI: Kathy Herman is a 78 y.o. female who is coming in today for the above mentioned reasons. Past Medical History is significant for: Type 2 diabetes, hyperlipidemia, GERD, hyperparathyroidism with hypercalcemia, vitamin D  and B12 deficiencies.  She is feeling well.  Her hyperparathyroidism is followed by endocrinology.   Past Medical/Surgical History: Past Medical History:  Diagnosis Date   Allergic rhinitis, cause unspecified 05/31/2007   CERVICAL RADICULOPATHY, RIGHT 01/07/2010   Diabetes mellitus without complication (HCC)    dx 2019   GERD 07/13/2007   reports hx of reflux that was causing some dysphagia, reports now improved     Hypercalcemia    has since stopped supplemnetal calcium and following pcp for mgt    Hyperlipidemia     Past Surgical History:  Procedure Laterality Date   BLEPHAROPLASTY  2016   CATARACT EXTRACTION Bilateral 2021   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 12/22/2018   Procedure: Revision right knee uni to total knee arthroplasty;  Surgeon: Melodi Lerner, MD;  Location: [facility];  Service: Orthopedics;  Laterality: Right;    MEDIAL PARTIAL KNEE REPLACEMENT Right 2018   TUBAL LIGATION      Social History:  reports that she has never smoked. She has never used smokeless tobacco. She reports current alcohol use. No history on file for drug use.  Allergies: Allergies  Allergen Reactions   Erythromycin Nausea Only    Family History:  Family History  Problem Relation Age of Onset   Memory loss Mother    Kidney Stones Father    Congestive Heart Failure Father      Current Outpatient Medications:    Cholecalciferol (VITAMIN D ) 50 MCG (2000 UT) CAPS, Take by mouth., Disp: , Rfl:    cyanocobalamin  (VITAMIN B12) 1000 MCG/ML injection, Inject 1,000 mcg into the muscle once., Disp: , Rfl:    FINASTERIDE  PO, Take 5 mg by mouth daily., Disp: ,  Rfl:    metFORMIN  (GLUCOPHAGE -XR) 500 MG 24 hr tablet, TAKE 2 TABLETS(1000 MG) BY MOUTH DAILY WITH BREAKFAST, Disp: 180 tablet, Rfl: 1   simvastatin  (ZOCOR ) 40 MG tablet, TAKE 1 TABLET(40 MG) BY MOUTH AT BEDTIME, Disp: 90 tablet, Rfl: 1   tirzepatide  (MOUNJARO ) 7.5 MG/0.5ML Pen, Inject 7.5 mg into the skin once a week., Disp: 6 mL, Rfl: 0   pantoprazole  (PROTONIX ) 40 MG tablet, Take 1 tablet (40 mg total) by mouth daily., Disp: 90 tablet, Rfl: 1  Review of Systems:  Negative unless indicated in HPI.   Physical Exam: Vitals:   08/17/23 0844  BP: 110/80  Pulse: 84  Temp: 97.7 F (36.5 C)  TempSrc: Oral  SpO2: 98%  Weight: 157 lb 9.6 oz (71.5 kg)    Body mass index is 27.05 kg/m.   Physical Exam Vitals reviewed.  Constitutional:      Appearance: Normal appearance.  HENT:     Head: Normocephalic and atraumatic.  Eyes:     Conjunctiva/sclera: Conjunctivae normal.     Pupils: Pupils are equal, round, and reactive to light.  Cardiovascular:     Rate and Rhythm: Normal rate and regular rhythm.  Pulmonary:     Effort: Pulmonary effort is normal.     Breath sounds: Normal breath sounds.  Skin:    General: Skin is warm and dry.  Neurological:     General: No focal deficit present.     Mental Status: She  is alert and oriented to person, place, and time.  Psychiatric:        Mood and Affect: Mood normal.        Behavior: Behavior normal.        Thought Content: Thought content normal.        Judgment: Judgment normal.      Impression and Plan:  Controlled type 2 diabetes mellitus without complication, without long-term current use of insulin  (HCC) Assessment & Plan: Well-controlled with an A1c of 568 today.  She has been tolerating Mounjaro  7.5 mg well.    Orders: -     POCT glycosylated hemoglobin (Hb A1C)  Gastroesophageal reflux disease without esophagitis Assessment & Plan: Well-controlled on daily PPI, refill Protonix  today.  Orders: -     Pantoprazole   Sodium; Take 1 tablet (40 mg total) by mouth daily.  Dispense: 90 tablet; Refill: 1  Vitamin D  deficiency  Hyperlipidemia associated with type 2 diabetes mellitus (HCC) Assessment & Plan: On simvastatin  40 mg daily, LDL 63, at goal.   Primary hyperparathyroidism [facility]) Assessment & Plan: Followed by endocrinology.      Time spent:32 minutes reviewing chart, interviewing and examining patient and formulating plan of care.     Tully Theophilus Andrews, MD [facility]

## 2023-08-17 NOTE — Assessment & Plan Note (Signed)
 On simvastatin 40 mg daily, LDL 63, at goal.

## 2023-08-17 NOTE — Assessment & Plan Note (Signed)
 Followed by endocrinology

## 2023-08-17 NOTE — Assessment & Plan Note (Signed)
 Well-controlled on daily PPI, refill Protonix today.

## 2023-09-22 ENCOUNTER — Telehealth: Payer: Self-pay

## 2023-09-22 DIAGNOSIS — E210 Primary hyperparathyroidism: Secondary | ICD-10-CM

## 2023-09-22 NOTE — Telephone Encounter (Signed)
Patient left vm stating that her constipation has gotten worse and wants to know if she should go ahead  with recommendation and see the surgeon.

## 2023-10-04 ENCOUNTER — Other Ambulatory Visit: Payer: Self-pay | Admitting: Internal Medicine

## 2023-10-04 DIAGNOSIS — E119 Type 2 diabetes mellitus without complications: Secondary | ICD-10-CM

## 2023-10-06 ENCOUNTER — Ambulatory Visit (INDEPENDENT_AMBULATORY_CARE_PROVIDER_SITE_OTHER)

## 2023-10-06 DIAGNOSIS — E538 Deficiency of other specified B group vitamins: Secondary | ICD-10-CM

## 2023-10-06 MED ORDER — CYANOCOBALAMIN 1000 MCG/ML IJ SOLN
1000.0000 ug | Freq: Once | INTRAMUSCULAR | Status: AC
  Administered 2023-10-06: 1000 ug via INTRAMUSCULAR

## 2023-10-06 NOTE — Progress Notes (Signed)
Per orders of Dr. Hernandez, injection of Cyanocobalamin 1000 mcg given by Kazden Largo L Maliya Marich. Patient tolerated injection well.  

## 2023-10-14 ENCOUNTER — Other Ambulatory Visit: Payer: Self-pay | Admitting: Surgery

## 2023-10-14 ENCOUNTER — Other Ambulatory Visit (HOSPITAL_COMMUNITY): Payer: Self-pay | Admitting: Surgery

## 2023-10-14 DIAGNOSIS — E210 Primary hyperparathyroidism: Secondary | ICD-10-CM

## 2023-10-14 DIAGNOSIS — E213 Hyperparathyroidism, unspecified: Secondary | ICD-10-CM

## 2023-10-22 ENCOUNTER — Ambulatory Visit
Admission: RE | Admit: 2023-10-22 | Discharge: 2023-10-22 | Disposition: A | Discharge status: Home / Self Care | Source: Ambulatory Visit | Attending: Surgery | Admitting: Surgery

## 2023-10-22 DIAGNOSIS — E210 Primary hyperparathyroidism: Secondary | ICD-10-CM

## 2023-10-26 ENCOUNTER — Encounter: Payer: Self-pay | Admitting: Surgery

## 2023-10-26 NOTE — Progress Notes (Signed)
 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  Will await results of sestamibi scan scheduled for this week before deciding on an operative strategy.  Will discuss with patient when results available.  Darnell Level, MD [facility] practice Office: [phone]

## 2023-10-27 ENCOUNTER — Encounter (HOSPITAL_COMMUNITY)
Admission: RE | Admit: 2023-10-27 | Discharge: 2023-10-27 | Disposition: A | Discharge status: Home / Self Care | Source: Ambulatory Visit | Attending: Surgery | Admitting: Surgery

## 2023-10-27 DIAGNOSIS — E213 Hyperparathyroidism, unspecified: Secondary | ICD-10-CM | POA: Diagnosis present

## 2023-10-27 MED ORDER — TECHNETIUM TC 99M SESTAMIBI - CARDIOLITE
24.2000 | Freq: Once | INTRAVENOUS | Status: AC
  Administered 2023-10-27: 24.2 via INTRAVENOUS

## 2023-11-03 ENCOUNTER — Ambulatory Visit (INDEPENDENT_AMBULATORY_CARE_PROVIDER_SITE_OTHER): Admitting: *Deleted

## 2023-11-03 ENCOUNTER — Encounter: Payer: Self-pay | Admitting: Surgery

## 2023-11-03 DIAGNOSIS — E538 Deficiency of other specified B group vitamins: Secondary | ICD-10-CM | POA: Diagnosis not present

## 2023-11-03 MED ORDER — CYANOCOBALAMIN 1000 MCG/ML IJ SOLN
1000.0000 ug | Freq: Once | INTRAMUSCULAR | Status: AC
  Administered 2023-11-03: 1000 ug via INTRAMUSCULAR

## 2023-11-03 NOTE — Progress Notes (Signed)
 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  Now the sestamibi scan identifies a possible left superior adenoma.  Patient may have multiple adenomas, or may have four gland hyperplasia.  Will require neck exploration with identification of all four glands.  This will require an overnight hospital stay.  Attempted to reach patient and LMOM.  Will attempt to call again to discuss prior to scheduling surgery.  Darnell Level, MD [facility] practice Office: [phone]

## 2023-11-03 NOTE — Progress Notes (Signed)
 Per orders of Dr. Ardyth Harps, injection of B12 given by Kern Reap. Patient tolerated injection well.

## 2023-11-04 ENCOUNTER — Other Ambulatory Visit: Payer: Self-pay

## 2023-11-04 DIAGNOSIS — E210 Primary hyperparathyroidism: Secondary | ICD-10-CM

## 2023-11-05 ENCOUNTER — Telehealth: Payer: Self-pay | Admitting: Surgery

## 2023-11-05 ENCOUNTER — Ambulatory Visit: Payer: Self-pay | Admitting: Surgery

## 2023-11-05 NOTE — Telephone Encounter (Signed)
     Telephone call to patient to discuss neck exploration and parathyroidectomy.  Answered all questions.  Will plan towards surgery in June.  I will enter orders and have schedulers contact patient.  Darnell Level, MD [facility] practice Office: [phone]

## 2023-11-10 ENCOUNTER — Other Ambulatory Visit: Payer: BC Managed Care – PPO

## 2023-11-23 ENCOUNTER — Telehealth: Payer: Self-pay | Admitting: Pharmacy Technician

## 2023-11-23 ENCOUNTER — Other Ambulatory Visit (HOSPITAL_COMMUNITY): Payer: Self-pay

## 2023-11-23 ENCOUNTER — Other Ambulatory Visit: Payer: Self-pay | Admitting: Internal Medicine

## 2023-11-23 DIAGNOSIS — E1169 Type 2 diabetes mellitus with other specified complication: Secondary | ICD-10-CM

## 2023-11-23 LAB — HM DIABETES EYE EXAM

## 2023-11-23 NOTE — Telephone Encounter (Signed)
 Pharmacy Patient Advocate Encounter   Received notification from CoverMyMeds that prior authorization for Mounjaro  7.5MG /0.5ML auto-injectors is due for renewal.   Insurance verification completed.   The patient is insured through [facility].  Action: PA required; PA submitted to above mentioned insurance via CoverMyMeds Key/confirmation #/EOC [id number] Status is pending

## 2023-11-23 NOTE — Telephone Encounter (Signed)
 Noted.

## 2023-11-24 NOTE — Telephone Encounter (Signed)
 Pharmacy Patient Advocate Encounter  Received notification from [facility] that Prior Authorization for Mounjaro  2.5mg /0.24ml has been APPROVED from 11/23/23 to 11/22/24   PA #/Case ID/Reference #: [id number]    Approval letter indexed to media tab

## 2023-11-24 NOTE — Telephone Encounter (Signed)
 Left detailed message on machine for patient regarding:  Pharmacy Patient Advocate Encounter   Received notification from [facility] that Prior Authorization for Mounjaro  2.5mg /0.56ml has been APPROVED from 11/23/23 to 11/22/24

## 2023-12-01 ENCOUNTER — Ambulatory Visit (INDEPENDENT_AMBULATORY_CARE_PROVIDER_SITE_OTHER): Admitting: *Deleted

## 2023-12-01 DIAGNOSIS — E538 Deficiency of other specified B group vitamins: Secondary | ICD-10-CM

## 2023-12-01 MED ORDER — CYANOCOBALAMIN 1000 MCG/ML IJ SOLN
1000.0000 ug | Freq: Once | INTRAMUSCULAR | Status: AC
  Administered 2023-12-01: 1000 ug via INTRAMUSCULAR

## 2023-12-01 NOTE — Progress Notes (Signed)
 Per orders of Dr. Ardyth Harps, injection of B12 given by Kern Reap. Patient tolerated injection well.

## 2023-12-15 ENCOUNTER — Ambulatory Visit (INDEPENDENT_AMBULATORY_CARE_PROVIDER_SITE_OTHER): Payer: BC Managed Care – PPO | Admitting: Internal Medicine

## 2023-12-15 ENCOUNTER — Encounter: Payer: Self-pay | Admitting: Internal Medicine

## 2023-12-15 VITALS — BP 110/80 | HR 83 | Temp 97.6°F | Wt 156.8 lb

## 2023-12-15 DIAGNOSIS — Z7985 Long-term (current) use of injectable non-insulin antidiabetic drugs: Secondary | ICD-10-CM

## 2023-12-15 DIAGNOSIS — E119 Type 2 diabetes mellitus without complications: Secondary | ICD-10-CM

## 2023-12-15 DIAGNOSIS — E1169 Type 2 diabetes mellitus with other specified complication: Secondary | ICD-10-CM | POA: Diagnosis not present

## 2023-12-15 DIAGNOSIS — E210 Primary hyperparathyroidism: Secondary | ICD-10-CM

## 2023-12-15 DIAGNOSIS — E785 Hyperlipidemia, unspecified: Secondary | ICD-10-CM

## 2023-12-15 LAB — POCT GLYCOSYLATED HEMOGLOBIN (HGB A1C): Hemoglobin A1C: 5.5 % (ref 4.0–5.6)

## 2023-12-15 NOTE — Progress Notes (Signed)
 Established Patient Office Visit     CC/Reason for Visit: Follow-up chronic conditions  HPI: Kathy Herman is a 78 y.o. female who is coming in today for the above mentioned reasons. Past Medical History is significant for: Type 2 diabetes, hyperlipidemia, GERD, vitamin D  and B12 deficiencies, hyperparathyroidism with hypercalcemia.  She is scheduled for parathyroid  surgery in June with surgery.  She is otherwise doing well.   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 She has never used smokeless tobacco. She reports current alcohol use. No history on file for drug use.  Allergies: Allergies  Allergen Reactions   Erythromycin Nausea Only    Family History:  Family History  Problem Relation Age of Onset   Memory loss Mother    Kidney Stones Father    Congestive Heart Failure Father      Current Outpatient Medications:    celecoxib (CELEBREX) 200 MG capsule, Take 200 mg by mouth 2 (two) times daily., Disp: , Rfl:    Cholecalciferol (VITAMIN D ) 50 MCG (2000 UT) CAPS, Take by mouth., Disp: , Rfl:    cyanocobalamin  (VITAMIN B12) 1000 MCG/ML injection,  Inject 1,000 mcg into the muscle once., Disp: , Rfl:    FINASTERIDE  PO, Take 5 mg by mouth daily., Disp: , Rfl:    metFORMIN  (GLUCOPHAGE -XR) 500 MG 24 hr tablet, TAKE 2 TABLETS(1000 MG) BY MOUTH DAILY WITH BREAKFAST, Disp: 180 tablet, Rfl: 1   MOUNJARO  7.5 MG/0.5ML Pen, ADMINISTER 7.5 MG UNDER THE SKIN 1 TIME A WEEK, Disp: 6 mL, Rfl: 0   pantoprazole  (PROTONIX ) 40 MG tablet, Take 1 tablet (40 mg total) by mouth daily., Disp: 90 tablet, Rfl: 1   simvastatin  (ZOCOR ) 40 MG tablet, TAKE 1 TABLET(40 MG) BY MOUTH AT BEDTIME, Disp: 90 tablet, Rfl: 0  Review of Systems:  Negative unless indicated in HPI.   Physical Exam: Vitals:   12/15/23 0825  BP: 110/80  Pulse: 83  Temp: 97.6 F (36.4 C)  TempSrc: Oral  SpO2: 98%  Weight: 156 lb 12.8 oz (71.1 kg)    Body mass index is 26.91 kg/m.   Physical Exam Vitals reviewed.  Constitutional:      Appearance: Normal appearance.  HENT:     Head: Normocephalic and atraumatic.  Eyes:     Conjunctiva/sclera: Conjunctivae normal.  Cardiovascular:     Rate and Rhythm: Normal rate and regular rhythm.  Pulmonary:     Effort: Pulmonary effort is normal.     Breath sounds: Normal breath sounds.  Skin:    General: Skin is warm and dry.  Neurological:     General:  No focal deficit present.     Mental Status: She is alert and oriented to person, place, and time.  Psychiatric:        Mood and Affect: Mood normal.        Behavior: Behavior normal.        Thought Content: Thought content normal.        Judgment: Judgment normal.      Impression and Plan:  Controlled type 2 diabetes mellitus without complication, without long-term current use of insulin  (HCC) -     Microalbumin / creatinine urine ratio; Future -     POCT glycosylated hemoglobin (Hb A1C)  Hyperlipidemia associated with type 2 diabetes mellitus (HCC)  Primary hyperparathyroidism (HCC)  Hypercalcemia  -A1c of 5.5 demonstrates excellent diabetic management, continue Mounjaro   7.5 mg weekly. - Cholesterol is at goal, continue current regimen. - Follow-up after parathyroid  surgery.   Time spent:31 minutes reviewing chart, interviewing and examining patient and formulating plan of care.     Marguerita Shih, MD [facility]

## 2023-12-25 ENCOUNTER — Other Ambulatory Visit: Payer: Self-pay | Admitting: Internal Medicine

## 2023-12-26 ENCOUNTER — Other Ambulatory Visit: Payer: Self-pay | Admitting: Internal Medicine

## 2023-12-26 DIAGNOSIS — E119 Type 2 diabetes mellitus without complications: Secondary | ICD-10-CM

## 2024-01-04 ENCOUNTER — Ambulatory Visit (INDEPENDENT_AMBULATORY_CARE_PROVIDER_SITE_OTHER)

## 2024-01-04 DIAGNOSIS — E538 Deficiency of other specified B group vitamins: Secondary | ICD-10-CM | POA: Diagnosis not present

## 2024-01-04 MED ORDER — CYANOCOBALAMIN 1000 MCG/ML IJ SOLN
1000.0000 ug | Freq: Once | INTRAMUSCULAR | Status: AC
  Administered 2024-01-04: 1000 ug via INTRAMUSCULAR

## 2024-01-04 NOTE — Progress Notes (Signed)
Per orders of Dr. Hernandez , injection of Cyanocobalamin 1,000 mcg/mL given by  N . °Patient tolerated injection well. ° °

## 2024-02-04 ENCOUNTER — Ambulatory Visit: Admitting: *Deleted

## 2024-02-04 DIAGNOSIS — E538 Deficiency of other specified B group vitamins: Secondary | ICD-10-CM

## 2024-02-04 MED ORDER — CYANOCOBALAMIN 1000 MCG/ML IJ SOLN
1000.0000 ug | Freq: Once | INTRAMUSCULAR | Status: AC
  Administered 2024-02-04: 1000 ug via INTRAMUSCULAR

## 2024-02-04 NOTE — Progress Notes (Signed)
 Per orders of Dr. Ardyth Harps, injection of B12 given by Kern Reap. Patient tolerated injection well.

## 2024-02-09 ENCOUNTER — Other Ambulatory Visit: Payer: Self-pay | Admitting: Internal Medicine

## 2024-02-09 DIAGNOSIS — E1169 Type 2 diabetes mellitus with other specified complication: Secondary | ICD-10-CM

## 2024-02-12 NOTE — Progress Notes (Signed)
 COVID Vaccine received:  []  No [x]  Yes Date of any COVID positive Test in last 90 days: no PCP - Dr. Tully Theophilus Andrews Cardiologist - n/a  Chest x-ray -  EKG -   Stress Test -  ECHO -  Cardiac Cath -   Bowel Prep - [x]  No  []   Yes ______  Pacemaker / ICD device [x]  No []  Yes   Spinal Cord Stimulator:[x]  No []  Yes       History of Sleep Apnea? [x]  No []  Yes   CPAP used?- [x]  No []  Yes    Does the patient monitor blood sugar?          [x]  No []  Yes  []  N/A  Patient has: []  NO Hx DM   []  Pre-DM                 []  DM1  [x]   DM2 Does patient have a [employer] or Dexacom? [x]  No []  Yes   Fasting Blood Sugar Ranges-  Checks Blood Sugar ___1__ times every 4 months  GLP1 agonist / usual dose - Mounjaro  last does 02/14/24. Instructed to hold next dose. GLP1 instructions:  SGLT-2 inhibitors / usual dose - no SGLT-2 instructions:   Blood Thinner / Instructions:no Aspirin  Instructions:ASA 81 mg  iNSTRUCTED TO HOLD 5-7 DAYS PRIOR TO SURGERY.  Comments:   Activity level: Patient is able to climb a flight of stairs without difficulty; [x]  No CP  [x]  No SOB,    Patient canperform ADLs without assistance.   Anesthesia review: Abnl. EKG  Patient denies shortness of breath, fever, cough and chest pain at PAT appointment.  Patient verbalized understanding and agreement to the Pre-Surgical Instructions that were given to them at this PAT appointment. Patient was also educated of the need to review these PAT instructions again prior to his/her surgery.I reviewed the appropriate phone numbers to call if they have any and questions or concerns.

## 2024-02-12 NOTE — Patient Instructions (Signed)
 SURGICAL WAITING ROOM VISITATION  Patients having surgery or a procedure may have no more than 2 support people in the waiting area - these visitors may rotate.    Children under the age of 82 must have an adult with them who is not the patient.  Visitors with respiratory illnesses are discouraged from visiting and should remain at home.  If the patient needs to stay at the hospital during part of their recovery, the visitor guidelines for inpatient rooms apply. Pre-op nurse will coordinate an appropriate time for 1 support person to accompany patient in pre-op.  This support person may not rotate.    Please refer to the [facility] website for the visitor guidelines for Inpatients (after your surgery is over and you are in a regular room).       Your procedure is scheduled on: 02/22/24   Report to [facility] Main Entrance    Report to admitting at 5:15  AM   Call this number if you have problems the morning of surgery [phone]   Do not eat food :After Midnight.   After Midnight you may have the following liquids until 4:30 AM DAY OF SURGERY  Water Non-Citrus Juices (without pulp, NO RED-Apple, White grape, White cranberry) Black Coffee (NO MILK/CREAM OR CREAMERS, sugar ok)  Clear Tea (NO MILK/CREAM OR CREAMERS, sugar ok) regular and decaf                             Plain Jell-O (NO RED)                                           Fruit ices (not with fruit pulp, NO RED)                                     Popsicles (NO RED)                                                               Sports drinks like Gatorade (NO RED)                  Oral Hygiene is also important to reduce your risk of infection.                                    Remember - BRUSH YOUR TEETH THE MORNING OF SURGERY WITH YOUR REGULAR TOOTHPASTE  DENTURES WILL BE REMOVED PRIOR TO SURGERY PLEASE DO NOT APPLY Poly grip OR ADHESIVES!!!   Stop all vitamins and herbal supplements 7 days before  surgery.   Take these medicines the morning of surgery with A SIP OF WATER: pantoprazole , simvastatin   DO NOT TAKE ANY ORAL DIABETIC MEDICATIONS DAY OF YOUR SURGERY Hold Metformin  the morning of surgery.              You may not have any metal on your body including hair pins, jewelry, and body piercing  Do not wear make-up, lotions, powders, perfumes/cologne, or deodorant  Do not wear nail polish including gel and S&S, artificial/acrylic nails, or any other type of covering on natural nails including finger and toenails. If you have artificial nails, gel coating, etc. that needs to be removed by a nail salon please have this removed prior to surgery or surgery may need to be canceled/ delayed if the surgeon/ anesthesia feels like they are unable to be safely monitored.   Do not shave  48 hours prior to surgery.    Do not bring valuables to the hospital. [facility] IS NOT             RESPONSIBLE   FOR VALUABLES.   Contacts, glasses, dentures or bridgework may not be worn into surgery.   Bring small overnight bag day of surgery.   DO NOT BRING YOUR HOME MEDICATIONS TO THE HOSPITAL. PHARMACY WILL DISPENSE MEDICATIONS LISTED ON YOUR MEDICATION LIST TO YOU DURING YOUR ADMISSION IN THE HOSPITAL!    Patients discharged on the day of surgery will not be allowed to drive home.  Someone NEEDS to stay with you for the first 24 hours after anesthesia.   Special Instructions: Bring a copy of your healthcare power of attorney and living will documents the day of surgery if you haven't scanned them before.              Please read over the following fact sheets you were given: IF YOU HAVE QUESTIONS ABOUT YOUR PRE-OP INSTRUCTIONS PLEASE CALL [phone] Kathy Herman   If you received a COVID test during your pre-op visit  it is requested that you wear a mask when out in public, stay away from anyone that may not be feeling well and notify your surgeon if you develop symptoms. If you test  positive for Covid or have been in contact with anyone that has tested positive in the last 10 days please notify you surgeon.    [facility] - Preparing for Surgery Before surgery, you can play an important role.  Because skin is not sterile, your skin needs to be as free of germs as possible.  You can reduce the number of germs on your skin by washing with CHG (chlorahexidine gluconate) soap before surgery.  CHG is an antiseptic cleaner which kills germs and bonds with the skin to continue killing germs even after washing. Please DO NOT use if you have an allergy to CHG or antibacterial soaps.  If your skin becomes reddened/irritated stop using the CHG and inform your nurse when you arrive at Short Stay. Do not shave (including legs and underarms) for at least 48 hours prior to the first CHG shower.  You may shave your face/neck.  Please follow these instructions carefully:  1.  Shower with CHG Soap the night before surgery and the  morning of surgery.  2.  If you choose to wash your hair, wash your hair first as usual with your normal  shampoo.  3.  After you shampoo, rinse your hair and body thoroughly to remove the shampoo.                             4.  Use CHG as you would any other liquid soap.  You can apply chg directly to the skin and wash.  Gently with a scrungie or clean washcloth.  5.  Apply the CHG Soap to your body ONLY FROM THE NECK DOWN.  Do   not use on face/ open                           Wound or open sores. Avoid contact with eyes, ears mouth and   genitals (private parts).                       Wash face,  Genitals (private parts) with your normal soap.             6.  Wash thoroughly, paying special attention to the area where your    surgery  will be performed.  7.  Thoroughly rinse your body with warm water from the neck down.  8.  DO NOT shower/wash with your normal soap after using and rinsing off the CHG Soap.                9.  Pat yourself dry with a clean towel.             10.  Wear clean pajamas.            11.  Place clean sheets on your bed the night of your first shower and do not  sleep with pets. Day of Surgery : Do not apply any lotions/deodorants the morning of surgery.  Please wear clean clothes to the hospital/surgery center.  FAILURE TO FOLLOW THESE INSTRUCTIONS MAY RESULT IN THE CANCELLATION OF YOUR SURGERY   __How to Manage Your Diabetes Before and After Surgery  Why is it important to control my blood sugar before and after surgery? Improving blood sugar levels before and after surgery helps healing and can limit problems. A way of improving blood sugar control is eating a healthy diet by:  Eating less sugar and carbohydrates  Increasing activity/exercise  Talking with your doctor about reaching your blood sugar goals High blood sugars (greater than 180 mg/dL) can raise your risk of infections and slow your recovery, so you will need to focus on controlling your diabetes during the weeks before surgery. Make sure that the doctor who takes care of your diabetes knows about your planned surgery including the date and location.  How do I manage my blood sugar before surgery? Check your blood sugar at least 4 times a day, starting 2 days before surgery, to make sure that the level is not too high or low. Check your blood sugar the morning of your surgery when you wake up and every 2 hours until you get to the Short Stay unit. If your blood sugar is less than 70 mg/dL, you will need to treat for low blood sugar: Do not take insulin . Treat a low blood sugar (less than 70 mg/dL) with  cup of clear juice (cranberry or apple), 4 glucose tablets, OR glucose gel. Recheck blood sugar in 15 minutes after treatment (to make sure it is greater than 70 mg/dL). If your blood sugar is not greater than 70 mg/dL on recheck, call [phone] for further instructions. Report your blood sugar to the short stay nurse when you get to Short Stay.  If you  are admitted to the hospital after surgery: Your blood sugar will be checked by the staff and you will probably be given insulin  after surgery (instead of oral diabetes medicines) to make sure you have good blood sugar levels. The goal for blood sugar control after surgery is 80-180 mg/dL.   WHAT DO I DO ABOUT MY DIABETES  MEDICATION?  Do not take oral diabetes medicines (pills) the morning of surgery. Hold Metformin .  DO NOT TAKE THE FOLLOWING 7 DAYS PRIOR TO SURGERY: Ozempic, Wegovy, Rybelsus (Semaglutide), Byetta (exenatide), Bydureon (exenatide ER), Victoza, Saxenda (liraglutide), or Trulicity  (dulaglutide ) Mounjaro  (Tirzepatide ) Adlyxin (Lixisenatide), Polyethylene Glycol Loxenatide.  Patient Signature:  Date:   Nurse Signature:  Date:

## 2024-02-15 ENCOUNTER — Encounter (HOSPITAL_COMMUNITY)
Admission: RE | Admit: 2024-02-15 | Discharge: 2024-02-15 | Disposition: A | Discharge status: Home / Self Care | Source: Ambulatory Visit | Attending: Surgery | Admitting: Surgery

## 2024-02-15 ENCOUNTER — Other Ambulatory Visit: Payer: Self-pay

## 2024-02-15 ENCOUNTER — Encounter (HOSPITAL_COMMUNITY): Payer: Self-pay

## 2024-02-15 ENCOUNTER — Ambulatory Visit: Payer: Self-pay | Admitting: Surgery

## 2024-02-15 VITALS — BP 123/68 | HR 85 | Resp 16 | Ht 64.0 in | Wt 152.0 lb

## 2024-02-15 DIAGNOSIS — Z01818 Encounter for other preprocedural examination: Secondary | ICD-10-CM | POA: Insufficient documentation

## 2024-02-15 DIAGNOSIS — E119 Type 2 diabetes mellitus without complications: Secondary | ICD-10-CM | POA: Diagnosis not present

## 2024-02-15 LAB — CBC
HCT: 39.7 % (ref 36.0–46.0)
Hemoglobin: 13.2 g/dL (ref 12.0–15.0)
MCH: 29.5 pg (ref 26.0–34.0)
MCHC: 33.2 g/dL (ref 30.0–36.0)
MCV: 88.8 fL (ref 80.0–100.0)
Platelets: 259 K/uL (ref 150–400)
RBC: 4.47 MIL/uL (ref 3.87–5.11)
RDW: 12.9 % (ref 11.5–15.5)
WBC: 7 K/uL (ref 4.0–10.5)
nRBC: 0 % (ref 0.0–0.2)

## 2024-02-15 LAB — BASIC METABOLIC PANEL WITH GFR
Anion gap: 8 (ref 5–15)
BUN: 17 mg/dL (ref 8–23)
CO2: 25 mmol/L (ref 22–32)
Calcium: 11.1 mg/dL — ABNORMAL HIGH (ref 8.9–10.3)
Chloride: 107 mmol/L (ref 98–111)
Creatinine, Ser: 0.56 mg/dL (ref 0.44–1.00)
GFR, Estimated: >60 mL/min (ref >60)
Glucose, Bld: 106 mg/dL — ABNORMAL HIGH (ref 70–99)
Potassium: 5.2 mmol/L — ABNORMAL HIGH (ref 3.5–5.1)
Sodium: 140 mmol/L (ref 135–145)

## 2024-02-15 LAB — GLUCOSE, CAPILLARY: Glucose-Capillary: 114 mg/dL — ABNORMAL HIGH (ref 70–99)

## 2024-02-15 NOTE — Progress Notes (Signed)
 Request routed to Dr. Eletha to send pre op orders for PST visit .

## 2024-02-16 LAB — HEMOGLOBIN A1C
Hgb A1c MFr Bld: 5.2 % (ref 4.8–5.6)
Mean Plasma Glucose: 103 mg/dL

## 2024-02-21 ENCOUNTER — Encounter (HOSPITAL_COMMUNITY): Payer: Self-pay | Admitting: Surgery

## 2024-02-21 NOTE — H&P (Signed)
 REFERRING PHYSICIAN: Shamleffer, Abby, MD  PROVIDER: Kiari Hosmer Kathy SPINNER, MD   Chief Complaint: New Consultation (Primary hyperparathyroidism)  History of Present Illness:  Patient is referred by her primary care physician, Dr. Jackquelyn Myrick Theophilus Herman, for surgical evaluation and recommendations regarding primary hyperparathyroidism. Patient was noted several years ago to have hypercalcemia. Calcium levels have ranged as high as 11.4. Intact PTH levels have ranged up to 66. 24-hour urine collection for calcium was elevated at 304. Patient has been treated for vitamin D  insufficiency and her recent level was normal at 35.89. Patient has been symptomatic with fatigue. She denies nephrolithiasis. She has had some recent problems with constipation. She notes mild urinary frequency. She does have bone and joint discomfort. Patient has had a bone density scan showing osteopenia. Patient has had no prior head or neck surgery. There is no family history of parathyroid  disease or other endocrine neoplasm. 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  Review of Systems: A complete review of systems was obtained from the patient. I have reviewed this information and discussed as appropriate with the patient. See HPI as well for other ROS.  Review of Systems  Constitutional: Positive for malaise/fatigue.  HENT: Negative.  Eyes: Negative.  Respiratory: Negative.  Cardiovascular: Negative.  Gastrointestinal: Positive for constipation.  Genitourinary: Positive for frequency.  Musculoskeletal: Positive for joint pain.  Skin: Negative.  Neurological: Negative. Negative for tremors.  Endo/Heme/Allergies: Negative.  Psychiatric/Behavioral: Negative.    Medical History: Past Medical History:  Diagnosis Date  Diabetes mellitus without complication (CMS/HHS-HCC)  GERD (gastroesophageal reflux disease)   Patient Active Problem List  Diagnosis  Primary  hyperparathyroidism (CMS/HHS-HCC)   Past Surgical History:  Procedure Laterality Date  BLEPHAROPLASTY  CATARACT EXTRACTION  JOINT REPLACEMENT  LAPAROSCOPIC TUBAL LIGATION    Allergies  Allergen Reactions  Erythromycin Nausea   Current Outpatient Medications on File Prior to Visit  Medication Sig Dispense Refill  aspirin  (VAZALORE ) 81 mg Cap Aspirin  81 mg  cholecalciferol (VITAMIN D3) 2,000 unit capsule Take by mouth  cyanocobalamin  (VITAMIN B12) 1,000 mcg/mL injection Inject 1,000 mcg into the muscle  finasteride  (PROSCAR ) 5 mg tablet TAKE HALF (2.5MG ) TABLET BY MOUTH EVERY DAY  metFORMIN  (GLUCOPHAGE -XR) 500 MG XR tablet  MOUNJARO  7.5 mg/0.5 mL pen injector ADMINISTER 7.5 MG UNDER THE SKIN 1 TIME A WEEK  pantoprazole  (PROTONIX ) 40 MG DR tablet  simvastatin  (ZOCOR ) 40 MG tablet   No current facility-administered medications on file prior to visit.   Family History  Problem Relation Age of Onset  High blood pressure (Hypertension) Sister  Breast cancer Niece    Social History   Tobacco Use  Smoking Status Never  Smokeless Tobacco Never    Social History   Socioeconomic History  Marital status: Married  Tobacco Use  Smoking status: Never  Smokeless tobacco: Never  Vaping Use  Vaping status: Never Used  Substance and Sexual Activity  Alcohol use: Yes  Drug use: Never   Social Drivers of Health   Housing Stability: Unknown (10/13/2023)  Housing Stability Vital Sign  Homeless in the Last Year: No   Objective:   Vitals:  BP: 139/85  Pulse: 82  Temp: 36.8 C (98.3 F)  SpO2: 98%  Weight: 71.2 kg (157 lb)  Height: 162.6 cm (5' 4)  PainSc: 0-No pain   Body mass index is 26.95 kg/m.  Physical Exam   GENERAL APPEARANCE Comfortable, no acute issues Development: normal Gross deformities: none  SKIN Rash, lesions, ulcers: none  Induration, erythema: none Nodules: none palpable  EYES Conjunctiva and lids: normal Pupils: equal  EARS, NOSE, MOUTH,  THROAT External ears: no lesion or deformity External nose: no lesion or deformity Hearing: grossly normal  NECK Symmetric: yes Trachea: midline Thyroid : no palpable nodules in the thyroid  bed  CHEST/CV Not assessed  ABDOMEN Not assessed  GENITOURINARY/RECTAL Not assessed  MUSCULOSKELETAL Station and gait: normal Digits and nails: no clubbing or cyanosis Muscle strength: grossly normal all extremities Deformity: none  LYMPHATIC Cervical: none palpable Supraclavicular: none palpable  PSYCHIATRIC Oriented to person, place, and time: yes Mood and affect: normal for situation Judgment and insight: appropriate for situation   Assessment and Plan:   Primary hyperparathyroidism (CMS/HHS-HCC)  Patient is referred by her primary care physician for surgical evaluation and management of primary hyperparathyroidism.  Patient provided with a copy of Parathyroid  Surgery: Treatment for Your Parathyroid  Gland Problem, published by [employer], 12 pages. Book reviewed and explained to patient during visit today.  Today we reviewed her clinical history. We reviewed her recent laboratory studies. I would like to proceed with imaging studies to include an ultrasound examination of the neck as well as a nuclear medicine parathyroid  scan with sestamibi. These 2 studies together are approximately 80% successful in confirming the diagnosis and localizing a parathyroid  adenoma. If they fail to identify the adenoma, then we will consider a 4D CT scan of the neck with parathyroid  protocol.  If we are successful with identifying the parathyroid  adenoma, then I believe she will be a good candidate for minimally invasive outpatient surgery. Today we discussed minimally invasive parathyroidectomy. We discussed the size and location of the surgical incision. We discussed the risk and benefits of the procedure including the risk of recurrent nerve injury. We discussed the hospital stay to be anticipated. We  discussed her postoperative recovery and returned to work and activities. The patient understands and agrees to proceed.  Patient will undergo the above studies. I will be in touch with the results and we will make plans for further management at that time.   Kathy Spinner, MD [facility] practice Office: [phone]

## 2024-02-21 NOTE — Anesthesia Preprocedure Evaluation (Signed)
 Anesthesia Evaluation    Reviewed: Allergy & Precautions, Patient's Chart, lab work & pertinent test results  Airway Mallampati: II  TM Distance: >3 FB Neck ROM: Full    Dental no notable dental hx. (+) Teeth Intact, Dental Advisory Given   Pulmonary neg pulmonary ROS   Pulmonary exam normal breath sounds clear to auscultation       Cardiovascular negative cardio ROS Normal cardiovascular exam Rhythm:Regular Rate:Normal     Neuro/Psych negative neurological ROS  negative psych ROS   GI/Hepatic Neg liver ROS,GERD  ,,  Endo/Other  diabetes, Type 2, Oral Hypoglycemic Agents    Renal/GU negative Renal ROS  negative genitourinary   Musculoskeletal  (+) Arthritis ,    Abdominal   Peds  Hematology negative hematology ROS (+)   Anesthesia Other Findings Primary hyperparathyroidism  Reproductive/Obstetrics                              Anesthesia Physical Anesthesia Plan  ASA: 2  Anesthesia Plan: General   Post-op Pain Management: Tylenol  PO (pre-op)*   Induction: Intravenous  PONV Risk Score and Plan: 3 and Dexamethasone , Ondansetron  and Treatment may vary due to age or medical condition  Airway Management Planned: Oral ETT  Additional Equipment:   Intra-op Plan:   Post-operative Plan: Extubation in OR  Informed Consent: I have reviewed the patients History and Physical, chart, labs and discussed the procedure including the risks, benefits and alternatives for the proposed anesthesia with the patient or authorized representative who has indicated his/her understanding and acceptance.     Dental advisory given  Plan Discussed with: CRNA  Anesthesia Plan Comments:          Anesthesia Quick Evaluation

## 2024-02-22 ENCOUNTER — Other Ambulatory Visit (HOSPITAL_COMMUNITY): Payer: Self-pay

## 2024-02-22 ENCOUNTER — Other Ambulatory Visit: Payer: Self-pay

## 2024-02-22 ENCOUNTER — Encounter (HOSPITAL_COMMUNITY): Payer: Self-pay | Admitting: Anesthesiology

## 2024-02-22 ENCOUNTER — Encounter (HOSPITAL_COMMUNITY)
Admission: RE | Disposition: A | Discharge status: Home / Self Care | Payer: Self-pay | Source: Home / Self Care | Attending: Surgery

## 2024-02-22 ENCOUNTER — Encounter (HOSPITAL_COMMUNITY): Payer: Self-pay | Admitting: Surgery

## 2024-02-22 ENCOUNTER — Ambulatory Visit (HOSPITAL_COMMUNITY)
Admission: RE | Admit: 2024-02-22 | Discharge: 2024-02-22 | Disposition: A | Discharge status: Home / Self Care | Attending: Surgery | Admitting: Surgery

## 2024-02-22 ENCOUNTER — Ambulatory Visit (HOSPITAL_COMMUNITY): Payer: Self-pay | Admitting: Anesthesiology

## 2024-02-22 DIAGNOSIS — K219 Gastro-esophageal reflux disease without esophagitis: Secondary | ICD-10-CM | POA: Insufficient documentation

## 2024-02-22 DIAGNOSIS — E119 Type 2 diabetes mellitus without complications: Secondary | ICD-10-CM | POA: Diagnosis not present

## 2024-02-22 DIAGNOSIS — Z7984 Long term (current) use of oral hypoglycemic drugs: Secondary | ICD-10-CM | POA: Insufficient documentation

## 2024-02-22 DIAGNOSIS — R350 Frequency of micturition: Secondary | ICD-10-CM | POA: Diagnosis not present

## 2024-02-22 DIAGNOSIS — R5383 Other fatigue: Secondary | ICD-10-CM | POA: Diagnosis not present

## 2024-02-22 DIAGNOSIS — K5900 Constipation, unspecified: Secondary | ICD-10-CM | POA: Diagnosis not present

## 2024-02-22 DIAGNOSIS — E210 Primary hyperparathyroidism: Secondary | ICD-10-CM | POA: Diagnosis present

## 2024-02-22 DIAGNOSIS — M1990 Unspecified osteoarthritis, unspecified site: Secondary | ICD-10-CM | POA: Insufficient documentation

## 2024-02-22 DIAGNOSIS — M8580 Other specified disorders of bone density and structure, unspecified site: Secondary | ICD-10-CM | POA: Diagnosis not present

## 2024-02-22 DIAGNOSIS — E213 Hyperparathyroidism, unspecified: Secondary | ICD-10-CM | POA: Diagnosis present

## 2024-02-22 DIAGNOSIS — D351 Benign neoplasm of parathyroid gland: Secondary | ICD-10-CM | POA: Diagnosis not present

## 2024-02-22 DIAGNOSIS — Z7985 Long-term (current) use of injectable non-insulin antidiabetic drugs: Secondary | ICD-10-CM | POA: Diagnosis not present

## 2024-02-22 HISTORY — PX: PARATHYROIDECTOMY: SHX19

## 2024-02-22 HISTORY — PX: PARATHYROID EXPLORATION: SHX732

## 2024-02-22 LAB — GLUCOSE, CAPILLARY
Glucose-Capillary: 115 mg/dL — ABNORMAL HIGH (ref 70–99)
Glucose-Capillary: 127 mg/dL — ABNORMAL HIGH (ref 70–99)

## 2024-02-22 SURGERY — EXPLORATION, PARATHYROID
Anesthesia: General | Site: Neck

## 2024-02-22 MED ORDER — AMISULPRIDE (ANTIEMETIC) 5 MG/2ML IV SOLN
INTRAVENOUS | Status: AC
  Filled 2024-02-22: qty 4

## 2024-02-22 MED ORDER — CHLORHEXIDINE GLUCONATE CLOTH 2 % EX PADS: 6.0000 | MEDICATED_PAD | Freq: Once | CUTANEOUS | Status: DC

## 2024-02-22 MED ORDER — CHLORHEXIDINE GLUCONATE 0.12 % MT SOLN
15.0000 mL | Freq: Once | OROMUCOSAL | Status: AC
  Administered 2024-02-22: 15 mL via OROMUCOSAL

## 2024-02-22 MED ORDER — SUGAMMADEX SODIUM 200 MG/2ML IV SOLN
INTRAVENOUS | Status: DC | PRN
  Administered 2024-02-22: 200 mg via INTRAVENOUS

## 2024-02-22 MED ORDER — ONDANSETRON HCL 4 MG/2ML IJ SOLN
INTRAMUSCULAR | Status: AC
  Filled 2024-02-22: qty 2

## 2024-02-22 MED ORDER — FENTANYL CITRATE (PF) 100 MCG/2ML IJ SOLN
INTRAMUSCULAR | Status: AC
  Filled 2024-02-22: qty 2

## 2024-02-22 MED ORDER — FENTANYL CITRATE (PF) 100 MCG/2ML IJ SOLN
INTRAMUSCULAR | Status: AC
Start: 2024-02-22 — End: 2024-02-22
  Filled 2024-02-22: qty 2

## 2024-02-22 MED ORDER — ACETAMINOPHEN 500 MG PO TABS
1000.0000 mg | ORAL_TABLET | Freq: Once | ORAL | Status: AC
  Administered 2024-02-22: 1000 mg via ORAL
  Filled 2024-02-22: qty 2

## 2024-02-22 MED ORDER — 0.9 % SODIUM CHLORIDE (POUR BTL) OPTIME
TOPICAL | Status: DC | PRN
  Administered 2024-02-22: 1000 mL

## 2024-02-22 MED ORDER — FENTANYL CITRATE PF 50 MCG/ML IJ SOSY
25.0000 ug | PREFILLED_SYRINGE | INTRAMUSCULAR | Status: DC | PRN
  Administered 2024-02-22 (×2): 25 ug via INTRAVENOUS

## 2024-02-22 MED ORDER — LIDOCAINE 2% (20 MG/ML) 5 ML SYRINGE
INTRAMUSCULAR | Status: DC | PRN
  Administered 2024-02-22: 60 mg via INTRAVENOUS

## 2024-02-22 MED ORDER — PROPOFOL 10 MG/ML IV BOLUS
INTRAVENOUS | Status: DC | PRN
  Administered 2024-02-22: 150 mg via INTRAVENOUS

## 2024-02-22 MED ORDER — ROCURONIUM BROMIDE 10 MG/ML (PF) SYRINGE
PREFILLED_SYRINGE | INTRAVENOUS | Status: AC
  Filled 2024-02-22: qty 10

## 2024-02-22 MED ORDER — AMISULPRIDE (ANTIEMETIC) 5 MG/2ML IV SOLN
10.0000 mg | Freq: Once | INTRAVENOUS | Status: AC | PRN
  Administered 2024-02-22: 10 mg via INTRAVENOUS

## 2024-02-22 MED ORDER — OXYCODONE HCL 5 MG PO TABS
5.0000 mg | ORAL_TABLET | Freq: Once | ORAL | Status: AC | PRN
  Administered 2024-02-22: 5 mg via ORAL

## 2024-02-22 MED ORDER — LIDOCAINE HCL (PF) 2 % IJ SOLN
INTRAMUSCULAR | Status: AC
  Filled 2024-02-22: qty 5

## 2024-02-22 MED ORDER — SUGAMMADEX SODIUM 200 MG/2ML IV SOLN
INTRAVENOUS | Status: AC
  Filled 2024-02-22: qty 2

## 2024-02-22 MED ORDER — BUPIVACAINE-EPINEPHRINE (PF) 0.25% -1:200000 IJ SOLN
INTRAMUSCULAR | Status: AC
  Filled 2024-02-22: qty 30

## 2024-02-22 MED ORDER — OXYCODONE HCL 5 MG/5ML PO SOLN: 5.0000 mg | Freq: Once | ORAL | Status: AC | PRN

## 2024-02-22 MED ORDER — TRAMADOL HCL 50 MG PO TABS
50.0000 mg | ORAL_TABLET | Freq: Four times a day (QID) | ORAL | 0 refills | Status: DC | PRN
  Filled 2024-02-22: qty 15, 2d supply, fill #0

## 2024-02-22 MED ORDER — FENTANYL CITRATE PF 50 MCG/ML IJ SOSY
PREFILLED_SYRINGE | INTRAMUSCULAR | Status: AC
  Filled 2024-02-22: qty 1

## 2024-02-22 MED ORDER — ONDANSETRON HCL 4 MG/2ML IJ SOLN
INTRAMUSCULAR | Status: DC | PRN
  Administered 2024-02-22: 4 mg via INTRAVENOUS

## 2024-02-22 MED ORDER — ROCURONIUM BROMIDE 10 MG/ML (PF) SYRINGE
PREFILLED_SYRINGE | INTRAVENOUS | Status: DC | PRN
  Administered 2024-02-22: 5 mg via INTRAVENOUS
  Administered 2024-02-22: 50 mg via INTRAVENOUS

## 2024-02-22 MED ORDER — INSULIN ASPART 100 UNIT/ML IJ SOLN: 0.0000 [IU] | INTRAMUSCULAR | Status: DC | PRN

## 2024-02-22 MED ORDER — CEFAZOLIN SODIUM-DEXTROSE 2-4 GM/100ML-% IV SOLN
2.0000 g | INTRAVENOUS | Status: AC
  Administered 2024-02-22: 2 g via INTRAVENOUS
  Filled 2024-02-22: qty 100

## 2024-02-22 MED ORDER — PROPOFOL 10 MG/ML IV BOLUS
INTRAVENOUS | Status: AC
  Filled 2024-02-22: qty 20

## 2024-02-22 MED ORDER — DEXAMETHASONE SODIUM PHOSPHATE 10 MG/ML IJ SOLN
INTRAMUSCULAR | Status: DC | PRN
  Administered 2024-02-22: 10 mg via INTRAVENOUS

## 2024-02-22 MED ORDER — DEXAMETHASONE SODIUM PHOSPHATE 10 MG/ML IJ SOLN
INTRAMUSCULAR | Status: AC
  Filled 2024-02-22: qty 1

## 2024-02-22 MED ORDER — OXYCODONE HCL 5 MG PO TABS
ORAL_TABLET | ORAL | Status: AC
  Filled 2024-02-22: qty 1

## 2024-02-22 MED ORDER — LACTATED RINGERS IV SOLN: INTRAVENOUS | Status: DC

## 2024-02-22 MED ORDER — HEMOSTATIC AGENTS (NO CHARGE) OPTIME
TOPICAL | Status: DC | PRN
  Administered 2024-02-22: 1 via TOPICAL

## 2024-02-22 MED ORDER — ORAL CARE MOUTH RINSE: 15.0000 mL | Freq: Once | OROMUCOSAL | Status: AC

## 2024-02-22 MED ORDER — FENTANYL CITRATE (PF) 100 MCG/2ML IJ SOLN
INTRAMUSCULAR | Status: DC | PRN
  Administered 2024-02-22: 25 ug via INTRAVENOUS
  Administered 2024-02-22: 50 ug via INTRAVENOUS
  Administered 2024-02-22: 100 ug via INTRAVENOUS
  Administered 2024-02-22: 25 ug via INTRAVENOUS

## 2024-02-22 MED ORDER — BUPIVACAINE-EPINEPHRINE 0.25% -1:200000 IJ SOLN
INTRAMUSCULAR | Status: DC | PRN
  Administered 2024-02-22: 10 mL

## 2024-02-22 SURGICAL SUPPLY — 31 items
ATTRACTOMAT 16X20 MAGNETIC DRP (DRAPES) ×2 IMPLANT
BAG COUNTER SPONGE SURGICOUNT (BAG) ×2 IMPLANT
BLADE SURG 15 STRL LF DISP TIS (BLADE) ×2 IMPLANT
CHLORAPREP W/TINT 26 (MISCELLANEOUS) ×2 IMPLANT
CLIP TI MEDIUM 6 (CLIP) ×4 IMPLANT
CLIP TI WIDE RED SMALL 6 (CLIP) ×4 IMPLANT
COVER SURGICAL LIGHT HANDLE (MISCELLANEOUS) ×2 IMPLANT
DERMABOND ADVANCED .7 DNX12 (GAUZE/BANDAGES/DRESSINGS) ×2 IMPLANT
DRAPE LAPAROTOMY T 98X78 PEDS (DRAPES) ×2 IMPLANT
DRAPE UTILITY XL STRL (DRAPES) ×2 IMPLANT
ELECT REM PT RETURN 15FT ADLT (MISCELLANEOUS) ×2 IMPLANT
GAUZE 4X4 16PLY ~~LOC~~+RFID DBL (SPONGE) ×2 IMPLANT
GLOVE SURG ORTHO 8.0 STRL STRW (GLOVE) ×2 IMPLANT
GOWN STRL REUS W/ TWL LRG LVL3 (GOWN DISPOSABLE) IMPLANT
GOWN STRL REUS W/ TWL XL LVL3 (GOWN DISPOSABLE) ×6 IMPLANT
HEMOSTAT SURGICEL 2X4 FIBR (HEMOSTASIS) ×2 IMPLANT
ILLUMINATOR WAVEGUIDE N/F (MISCELLANEOUS) IMPLANT
KIT BASIN OR (CUSTOM PROCEDURE TRAY) ×2 IMPLANT
KIT TURNOVER KIT A (KITS) ×2 IMPLANT
NDL HYPO 22X1.5 SAFETY MO (MISCELLANEOUS) ×2 IMPLANT
NEEDLE HYPO 22X1.5 SAFETY MO (MISCELLANEOUS) ×2 IMPLANT
PACK BASIC VI WITH GOWN DISP (CUSTOM PROCEDURE TRAY) ×2 IMPLANT
PENCIL SMOKE EVACUATOR (MISCELLANEOUS) ×2 IMPLANT
SHEARS HARMONIC 9CM CVD (BLADE) IMPLANT
SUT MNCRL AB 4-0 PS2 18 (SUTURE) ×2 IMPLANT
SUT VIC AB 3-0 SH 18 (SUTURE) ×2 IMPLANT
SYR 20ML LL LF (SYRINGE) IMPLANT
SYR BULB IRRIG 60ML STRL (SYRINGE) ×2 IMPLANT
SYR CONTROL 10ML LL (SYRINGE) ×2 IMPLANT
TOWEL OR 17X26 10 PK STRL BLUE (TOWEL DISPOSABLE) ×2 IMPLANT
TUBING CONNECTING 10 (TUBING) ×2 IMPLANT

## 2024-02-22 NOTE — Anesthesia Procedure Notes (Signed)
 Procedure Name: Intubation Date/Time: 02/22/2024 7:31 AM  Performed by: Marlean Mortell D, CRNAPre-anesthesia Checklist: Patient identified, Emergency Drugs available, Suction available and Patient being monitored Patient Re-evaluated:Patient Re-evaluated prior to induction Oxygen  Delivery Method: Circle system utilized Preoxygenation: Pre-oxygenation with 100% oxygen  Induction Type: IV induction Ventilation: Mask ventilation without difficulty Laryngoscope Size: Mac and 3 Grade View: Grade I Tube type: Oral Tube size: 7.0 mm Number of attempts: 1 Airway Equipment and Method: Stylet and Oral airway Placement Confirmation: ETT inserted through vocal cords under direct vision, positive ETCO2 and breath sounds checked- equal and bilateral Secured at: 21 cm Tube secured with: Tape Dental Injury: Teeth and Oropharynx as per pre-operative assessment

## 2024-02-22 NOTE — Discharge Instructions (Addendum)

## 2024-02-22 NOTE — Anesthesia Postprocedure Evaluation (Signed)
 Anesthesia Post Note  Patient: Kathy Herman  Procedure(s) Performed: EXPLORATION, PARATHYROID  LEFT SUPERIOR PARATHYROIDECTOMY (Neck)     Patient location during evaluation: PACU Anesthesia Type: General Level of consciousness: awake and alert Pain management: pain level controlled Vital Signs Assessment: post-procedure vital signs reviewed and stable Respiratory status: spontaneous breathing, nonlabored ventilation, respiratory function stable and patient connected to nasal cannula oxygen  Cardiovascular status: blood pressure returned to baseline and stable Postop Assessment: no apparent nausea or vomiting Anesthetic complications: no   No notable events documented.  Last Vitals:  Vitals:   02/22/24 1048 02/22/24 1050  BP: (!) 131/50 (!) 131/50  Pulse: 77   Resp: 18   Temp: 36.6 C   SpO2: 96% 97%    Last Pain:  Vitals:   02/22/24 1050  TempSrc:   PainSc: 0-No pain                 Satchel Heidinger L Dalina Samara

## 2024-02-22 NOTE — Op Note (Signed)
 Operative Note  Pre-operative Diagnosis: Primary hyperparathyroidism  Post-operative Diagnosis: Same  Surgeon:  Krystal Spinner, MD  Assistant:  Tonja Shaper, PA-C   Procedure: Neck exploration with left superior parathyroidectomy  Anesthesia:  general  Estimated Blood Loss:  minimal  Drains: none         Specimen: to pathology  Indications:  Patient is referred by her primary care physician, Dr. Jackquelyn Myrick Theophilus Delma, for surgical evaluation and recommendations regarding primary hyperparathyroidism. Patient was noted several years ago to have hypercalcemia. Calcium levels have ranged as high as 11.4. Intact PTH levels have ranged up to 66. 24-hour urine collection for calcium was elevated at 304. Patient has been treated for vitamin D  insufficiency and her recent level was normal at 35.89. Patient has been symptomatic with fatigue. She denies nephrolithiasis. She has had some recent problems with constipation. She notes mild urinary frequency. She does have bone and joint discomfort. Patient has had a bone density scan showing osteopenia. Patient has had no prior head or neck surgery. There is no family history of parathyroid  disease or other endocrine neoplasm. 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  Patient underwent nuclear medicine parathyroid  scan with sestamibi.  This failed to localize an adenoma.  Patient underwent ultrasound examination of the neck which suggested the possibility of multiple enlarged parathyroid  glands.  Patient now comes to surgery for neck exploration.  Procedure:  The patient was seen in the pre-op holding area. The risks, benefits, complications, treatment options, and expected outcomes were previously discussed with the patient. The patient agreed with the proposed plan and has signed the informed consent form.  The patient was brought to the operating room by the surgical team, identified as Kathy Herman and the procedure  verified. A time out was completed and the above information confirmed.  Following administration of general anesthesia the patient was positioned and then prepped and draped in the usual aseptic fashion.  After ascertaining that an adequate level of anesthesia been achieved, a small centrally located Kocher incision was made with a number 15 blade.  Subplatysmal flaps were developed cephalad and caudad.  A self-retaining retractor was placed for exposure.  Strap muscles are incised in the midline and a dissection has begun on the left side.  There appears to be normal left inferior gland at the inferior pole of the left eye laterally.  He met with adjacent adipose tissue.  No enlarged parathyroid  tissue is identified around the inferior pole.  The left thyroid  lobe was mobilized and explored superiorly revealing an enlarged left superior gland.  This is left in situ.  Next we explored the right side.  The right inferior pole there is a normal parathyroid  gland.  The right lobe is further mobilized.  There is a smooth nodule in the posterior aspect of the right thyroid  lobe measuring about 1.5 to 2 cm in size.  A right superior parathyroid  gland is identified just above the level of the right inferior thyroid  artery.  This appears normal.  We returned to the left side.  The left superior parathyroid  gland is mobilized.  Vascular structures are divided between small ligaclips with the harmonic scalpel.  The entire gland is excised.  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  Wound was irrigated with warm saline.  Fibrillar was placed throughout the operative field.  Good hemostasis was noted.  Strap muscles were reapproximated in the midline of interrupted 3-0  Vicryl sutures.  Platysma was closed with interrupted 3-0 Vicryl sutures.  Was anesthetized with local anesthetic.  Skin incision was closed with a running 4-0  Monocryl subcuticular suture.  Wound was washed and dried and Dermabond was applied as dressing.  Patient was awakened from anesthesia and transferred to the recovery room in stable condition.  The patient tolerated the procedure well.   Krystal Spinner, MD [facility] Surgery Office: [phone]

## 2024-02-22 NOTE — Transfer of Care (Signed)
 Immediate Anesthesia Transfer of Care Note  Patient: Kathy Herman  Procedure(s) Performed: EXPLORATION, PARATHYROID  LEFT SUPERIOR PARATHYROIDECTOMY (Neck)  Patient Location: PACU  Anesthesia Type:General  Level of Consciousness: awake, alert , and oriented  Airway & Oxygen  Therapy: Patient Spontanous Breathing and Patient connected to face mask oxygen   Post-op Assessment: Report given to RN and Post -op Vital signs reviewed and stable  Post vital signs: Reviewed and stable  Last Vitals:  Vitals Value Taken Time  BP 134/65 02/22/24 09:07  Temp    Pulse 84 02/22/24 09:09  Resp 17 02/22/24 09:09  SpO2 100 % 02/22/24 09:09  Vitals shown include unfiled device data.  Last Pain:  Vitals:   02/22/24 0622  TempSrc:   PainSc: 5       Patients Stated Pain Goal: 3 (02/22/24 0622)  Complications: No notable events documented.

## 2024-02-22 NOTE — Interval H&P Note (Signed)
 History and Physical Interval Note:  02/22/2024 7:01 AM  Kathy Herman  has presented today for surgery, with the diagnosis of PRIMARY HYPERPARATHYROIDISM.  The various methods of treatment have been discussed with the patient and family. After consideration of risks, benefits and other options for treatment, the patient has consented to    Procedure(s) with comments: EXPLORATION, PARATHYROID  (N/A) - NECK EXPLORATION WITH PARATHYROIDECTOMY as a surgical intervention.    The patient's history has been reviewed, patient examined, no change in status, stable for surgery.  I have reviewed the patient's chart and labs.  Questions were answered to the patient's satisfaction.    Krystal Spinner, MD [facility] practice Office: [phone]   Krystal Spinner

## 2024-02-23 ENCOUNTER — Encounter (HOSPITAL_COMMUNITY): Payer: Self-pay | Admitting: Surgery

## 2024-02-23 LAB — SURGICAL PATHOLOGY

## 2024-02-29 ENCOUNTER — Other Ambulatory Visit: Payer: Self-pay | Admitting: Internal Medicine

## 2024-02-29 DIAGNOSIS — E119 Type 2 diabetes mellitus without complications: Secondary | ICD-10-CM

## 2024-02-29 NOTE — Telephone Encounter (Unsigned)
 Copied from CRM [id number]. Topic: Clinical - Medication Refill >> Feb 29, 2024  4:15 PM Deleta S wrote: Medication: Mounjaro  7.5 MG  Patient would like increase in dosage if possible  Has the patient contacted their pharmacy? No (Agent: If no, request that the patient contact the pharmacy for the refill. If patient does not wish to contact the pharmacy document the reason why and proceed with request.) (Agent: If yes, when and what did the pharmacy advise?)  This is the patient's preferred pharmacy:  [id number] - [place], [place] - [address] AT [facility] OF [address] [place] [address] Phone: [phone] Fax: [phone]    Is this the correct pharmacy for this prescription? Yes If no, delete pharmacy and type the correct one.   Has the prescription been filled recently? Yes  Is the patient out of the medication? Yes  Has the patient been seen for an appointment in the last year OR does the patient have an upcoming appointment? Yes  Can we respond through MyChart? Yes  Agent: Please be advised that Rx refills may take up to 3 business days. We ask that you follow-up with your pharmacy.

## 2024-03-01 ENCOUNTER — Other Ambulatory Visit: Payer: Self-pay | Admitting: Internal Medicine

## 2024-03-01 DIAGNOSIS — E119 Type 2 diabetes mellitus without complications: Secondary | ICD-10-CM

## 2024-03-01 MED ORDER — TIRZEPATIDE 10 MG/0.5ML ~~LOC~~ SOAJ
10.0000 mg | SUBCUTANEOUS | 0 refills | Status: DC
Start: 2024-03-01 — End: 2024-04-26

## 2024-03-03 ENCOUNTER — Encounter: Payer: Self-pay | Admitting: Internal Medicine

## 2024-03-03 ENCOUNTER — Ambulatory Visit: Admitting: Internal Medicine

## 2024-03-03 VITALS — BP 110/68 | HR 82 | Temp 97.4°F | Wt 153.6 lb

## 2024-03-03 DIAGNOSIS — E119 Type 2 diabetes mellitus without complications: Secondary | ICD-10-CM

## 2024-03-03 DIAGNOSIS — R252 Cramp and spasm: Secondary | ICD-10-CM | POA: Diagnosis not present

## 2024-03-03 DIAGNOSIS — Z7985 Long-term (current) use of injectable non-insulin antidiabetic drugs: Secondary | ICD-10-CM | POA: Diagnosis not present

## 2024-03-03 LAB — MICROALBUMIN / CREATININE URINE RATIO
Creatinine,U: 49.8 mg/dL
Microalb Creat Ratio: 18.7 mg/g (ref 0.0–30.0)
Microalb, Ur: 0.9 mg/dL (ref 0.0–1.9)

## 2024-03-03 NOTE — Progress Notes (Signed)
 Established Patient Office Visit     CC/Reason for Visit: Hospital follow-up, leg cramps  HPI: Kathy Herman is a 78 y.o. female who is coming in today for the above mentioned reasons.  She just had surgery for primary hyperparathyroidism.  Ever since has been having significant leg cramps.  Recent A1c was 5.2.  She would like to increase her Mounjaro  dose.   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/2018   Procedure: Revision right knee uni to total knee arthroplasty;  Surgeon: Melodi Lerner, MD;  Location: [facility];  Service: Orthopedics;  Laterality: Right;    MEDIAL PARTIAL KNEE REPLACEMENT Right 2018   PARATHYROID  EXPLORATION N/A 02/22/2024   Procedure: EXPLORATION, PARATHYROID ;  Surgeon: Eletha Boas, MD;  Location: [facility];  Service: General;  Laterality: N/A;  NECK EXPLORATION WITH PARATHYROIDECTOMY   PARATHYROIDECTOMY  02/22/2024   Procedure: LEFT SUPERIOR PARATHYROIDECTOMY;  Surgeon: Eletha Boas, MD;  Location: [facility];  Service: General;;   TUBAL LIGATION      Social History:  reports that she has never smoked. She has never used smokeless tobacco. She reports current alcohol use. She reports that she does not currently use drugs.  Allergies: Allergies  Allergen Reactions   Erythromycin Nausea Only    Family History:  Family History  Problem Relation Age of Onset   Memory loss Mother    Kidney Stones  Father    Congestive Heart Failure Father      Current Outpatient Medications:    Aspirin  81 MG CAPS, Take 81 mg by mouth daily at 12 noon., Disp: , Rfl:    Cholecalciferol (VITAMIN D ) 50 MCG (2000 UT) CAPS, Take 2,000 Units by mouth daily at 12 noon., Disp: , Rfl:    cyanocobalamin  (VITAMIN B12) 1000 MCG/ML injection, Inject 1,000 mcg into the muscle every 30 (thirty) days., Disp: , Rfl:    finasteride  (PROSCAR ) 5 MG tablet, Take 2.5 mg by mouth daily., Disp: , Rfl:    metFORMIN  (GLUCOPHAGE -XR) 500 MG 24 hr tablet, TAKE 2 TABLETS(1000 MG) BY MOUTH DAILY WITH BREAKFAST (Patient taking differently: Take 1,000 mg by mouth daily with breakfast.), Disp: 180 tablet, Rfl: 1   MOUNJARO  7.5 MG/0.5ML Pen, ADMINISTER 7.5 MG UNDER THE SKIN 1 TIME A WEEK (Patient taking differently: Inject 7.5 mg into the skin once a week.), Disp: 6 mL, Rfl: 0   pantoprazole  (PROTONIX ) 40 MG tablet, Take 1 tablet (40 mg total) by mouth daily. (Patient taking differently: Take 40 mg by mouth every other day.), Disp: 90 tablet, Rfl: 1   simvastatin  (ZOCOR ) 40 MG tablet, TAKE 1 TABLET(40 MG) BY MOUTH AT BEDTIME (Patient taking differently: Take 40 mg by mouth daily at 6 PM.), Disp: 90 tablet, Rfl: 0   tirzepatide  (MOUNJARO ) 10 MG/0.5ML Pen, Inject 10 mg into the skin once a week., Disp: 6 mL, Rfl: 0   traMADol  (ULTRAM ) 50 MG  tablet, Take 1-2 tablets (50-100 mg total) by mouth every 6 (six) hours as needed for moderate pain (pain score 4-6)., Disp: 15 tablet, Rfl: 0  Review of Systems:  Negative unless indicated in HPI.   Physical Exam: Vitals:   03/03/24 0805  BP: 110/68  Pulse: 82  Temp: (!) 97.4 F (36.3 C)  TempSrc: Oral  SpO2: 98%  Weight: 153 lb 9.6 oz (69.7 kg)    Body mass index is 26.37 kg/m.   Physical Exam Vitals reviewed.  Constitutional:      Appearance: Normal appearance.  HENT:     Head: Normocephalic and atraumatic.  Eyes:     Conjunctiva/sclera: Conjunctivae normal.  Cardiovascular:      Rate and Rhythm: Normal rate and regular rhythm.  Pulmonary:     Effort: Pulmonary effort is normal.     Breath sounds: Normal breath sounds.  Skin:    General: Skin is warm and dry.  Neurological:     General: No focal deficit present.     Mental Status: She is alert and oriented to person, place, and time.  Psychiatric:        Mood and Affect: Mood normal.        Behavior: Behavior normal.        Thought Content: Thought content normal.        Judgment: Judgment normal.      Impression and Plan:  Leg cramps -     Magnesium ; Future -     Comprehensive metabolic panel with GFR; Future  Controlled type 2 diabetes mellitus without complication, without long-term current use of insulin  (HCC) -     Microalbumin / creatinine urine ratio  -A1c is well-controlled, increase Mounjaro  to 10 mg.  Check microalbumin. - Check electrolytes as they may be contributing to her cramping.   Time spent:31 minutes reviewing chart, interviewing and examining patient and formulating plan of care.     Tully Theophilus Andrews, MD [facility]

## 2024-03-07 ENCOUNTER — Ambulatory Visit (INDEPENDENT_AMBULATORY_CARE_PROVIDER_SITE_OTHER): Admitting: *Deleted

## 2024-03-07 ENCOUNTER — Ambulatory Visit

## 2024-03-07 DIAGNOSIS — E538 Deficiency of other specified B group vitamins: Secondary | ICD-10-CM | POA: Diagnosis not present

## 2024-03-07 MED ORDER — CYANOCOBALAMIN 1000 MCG/ML IJ SOLN
1000.0000 ug | Freq: Once | INTRAMUSCULAR | Status: AC
  Administered 2024-03-07: 1000 ug via INTRAMUSCULAR

## 2024-03-07 NOTE — Progress Notes (Signed)
 Per orders of Dr. Ardyth Harps, injection of B12 given by Kern Reap. Patient tolerated injection well.

## 2024-03-11 ENCOUNTER — Telehealth: Payer: Self-pay | Admitting: Internal Medicine

## 2024-03-11 NOTE — Telephone Encounter (Signed)
 Copied from CRM #[id number]. Topic: Appointments - Scheduling Inquiry for Clinic >> Mar 11, 2024  4:48 PM Tiffini S wrote: Reason for CRM: Patient is calling to reschedule the nurse visit appointment for B-12 on 04/07/24. Patient will not be able to make it to the appointment. Please call [phone] to reschedule.

## 2024-03-14 NOTE — Telephone Encounter (Signed)
 Lmom asking pt to callback to rsc

## 2024-03-14 NOTE — Telephone Encounter (Signed)
 Pt has been sch for earlier appt time

## 2024-04-07 ENCOUNTER — Ambulatory Visit

## 2024-04-07 ENCOUNTER — Ambulatory Visit (INDEPENDENT_AMBULATORY_CARE_PROVIDER_SITE_OTHER): Admitting: *Deleted

## 2024-04-07 DIAGNOSIS — E538 Deficiency of other specified B group vitamins: Secondary | ICD-10-CM | POA: Diagnosis not present

## 2024-04-07 MED ORDER — CYANOCOBALAMIN 1000 MCG/ML IJ SOLN
1000.0000 ug | Freq: Once | INTRAMUSCULAR | Status: AC
  Administered 2024-04-07: 1000 ug via INTRAMUSCULAR

## 2024-04-07 NOTE — Progress Notes (Signed)
Per orders of Dr. Legrand Como, injection of B12 given by Westley Hummer. Patient tolerated injection well.

## 2024-04-25 ENCOUNTER — Other Ambulatory Visit: Payer: Self-pay | Admitting: Internal Medicine

## 2024-04-25 DIAGNOSIS — E119 Type 2 diabetes mellitus without complications: Secondary | ICD-10-CM

## 2024-05-09 ENCOUNTER — Ambulatory Visit

## 2024-05-09 DIAGNOSIS — E538 Deficiency of other specified B group vitamins: Secondary | ICD-10-CM | POA: Diagnosis not present

## 2024-05-09 MED ORDER — CYANOCOBALAMIN 1000 MCG/ML IJ SOLN
1000.0000 ug | Freq: Once | INTRAMUSCULAR | Status: AC
  Administered 2024-05-09: 1000 ug via INTRAMUSCULAR

## 2024-05-09 NOTE — Progress Notes (Signed)
 Patient is in office today for a nurse visit for B12 Injection. Patient Injection was given in the  Left deltoid. Patient tolerated injection well.

## 2024-05-10 ENCOUNTER — Other Ambulatory Visit

## 2024-05-10 ENCOUNTER — Ambulatory Visit: Payer: BC Managed Care – PPO | Admitting: Internal Medicine

## 2024-05-10 VITALS — BP 118/60 | HR 79 | Ht 64.0 in | Wt 151.8 lb

## 2024-05-10 DIAGNOSIS — Z9889 Other specified postprocedural states: Secondary | ICD-10-CM | POA: Diagnosis not present

## 2024-05-10 DIAGNOSIS — Z9089 Acquired absence of other organs: Secondary | ICD-10-CM | POA: Diagnosis not present

## 2024-05-10 LAB — BASIC METABOLIC PANEL WITH GFR
BUN: 15 mg/dL (ref 7–25)
CO2: 27 mmol/L (ref 20–32)
Calcium: 10.6 mg/dL — ABNORMAL HIGH (ref 8.6–10.4)
Chloride: 107 mmol/L (ref 98–110)
Creat: 0.85 mg/dL (ref 0.60–1.00)
Glucose, Bld: 146 mg/dL — ABNORMAL HIGH (ref 65–99)
Potassium: 4.8 mmol/L (ref 3.5–5.3)
Sodium: 140 mmol/L (ref 135–146)
eGFR: 70 mL/min/1.73m2 (ref >=60)

## 2024-05-10 LAB — VITAMIN D 25 HYDROXY (VIT D DEFICIENCY, FRACTURES): Vit D, 25-Hydroxy: 53 ng/mL (ref 30–100)

## 2024-05-10 LAB — ALBUMIN: Albumin: 4.5 g/dL (ref 3.6–5.1)

## 2024-05-10 NOTE — Patient Instructions (Signed)
 You may continue to consume 2-3 servings of dietary calcium daily or choose to start over-the-counter calcium at 600 mg daily   Continue vitamin D  3 daily

## 2024-05-10 NOTE — Progress Notes (Unsigned)
 Name: Kathy Herman  MRN/ DOB: [id number], March 10, 1946    Age/ Sex: 78 y.o., female     PCP: Theophilus Andrews, Tully GRADE, MD   Reason for Endocrinology Evaluation: Hypercalcemia      Initial Endocrinology Clinic Visit: 10/26/2018    PATIENT IDENTIFIER: Kathy Herman is a 78 y.o., female with a past medical history of T2DM, Dyslipidemia and DJD. She has followed with [facility] since 10/26/2018  for consultative assistance with management of her hypercalcemia.   HISTORICAL SUMMARY: The patient was first diagnosed with hypercalcemia in 08/2018.   Ca/Cr ratio is 0.024 which is consistent with Primary hyperparathyroidism 02/2019 at 288 mg, repeat 24-hr urine calcium was 176 mg  01/2020 No evidence of renal calculi on KUB 02/2019 DXA shows osteopenia 12/2019 and in 12/2021  Repeat 24-hour urinary calcium excretion was elevated at 304 mg in November, 2024.    She is s/p left superior parathyroidectomy 02/22/2024   SUBJECTIVE:   Today (05/10/2024):  Kathy Herman is here for a follow up on hypercalcemia.   She is s/p left superior parathyroidectomy 02/22/2024  Serum calcium through [facility] 10.1 mg/DL on 1/86/7974, PTH 16  She has noted decrease in fatigue  Constipation has improved  Has noted pain in her bones Continues  with knee pains  No polydipsia  No perioral tingling  Has chronic night leg cramps  No recent falls  She consumes ~ 2 servings of calcium    Vitamin D  2000 iu daily    HISTORY:  Past Medical History:  Past Medical History:  Diagnosis Date   Allergic rhinitis, cause unspecified 05/31/2007   CERVICAL RADICULOPATHY, RIGHT 01/07/2010   Diabetes mellitus without complication (HCC)    dx 2019   GERD 07/13/2007   reports hx of reflux that was causing some dysphagia, reports now improved     Hypercalcemia    has since stopped supplemnetal calcium and following pcp for mgt    Hyperlipidemia    Past Surgical History:  Past Surgical History:   Procedure Laterality Date   arthroplasty Left    BLEPHAROPLASTY  2016   CATARACT EXTRACTION Bilateral 2021   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 12/22/2018   Procedure: Revision right knee uni to total knee arthroplasty;  Surgeon: Melodi Lerner, MD;  Location: [facility];  Service: Orthopedics;  Laterality: Right;    MEDIAL PARTIAL KNEE REPLACEMENT Right 2018   PARATHYROID  EXPLORATION N/A 02/22/2024   Procedure: EXPLORATION, PARATHYROID ;  Surgeon: Eletha Boas, MD;  Location: [facility];  Service: General;  Laterality: N/A;  NECK EXPLORATION WITH PARATHYROIDECTOMY   PARATHYROIDECTOMY  02/22/2024   Procedure: LEFT SUPERIOR PARATHYROIDECTOMY;  Surgeon: Eletha Boas, MD;  Location: [facility];  Service: General;;   TUBAL LIGATION     Social History:  reports that she has never smoked. She has never used smokeless tobacco. She reports current alcohol use. She reports that she does not currently use drugs. Family History:  Family History  Problem Relation Age of Onset   Memory loss Mother    Kidney Stones Father    Congestive Heart Failure Father      HOME MEDICATIONS: Allergies as of 05/10/2024       Reactions   Erythromycin Nausea Only        Medication List        Accurate as of May 10, 2024  8:43 AM. If you have any questions, ask your nurse or doctor.  Aspirin  81 MG Caps Take 81 mg by mouth daily at 12 noon.   cyanocobalamin  1000 MCG/ML injection Commonly known as: VITAMIN B12 Inject 1,000 mcg into the muscle every 30 (thirty) days.   finasteride  5 MG tablet Commonly known as: PROSCAR  Take 2.5 mg by mouth daily.   metFORMIN  500 MG 24 hr tablet Commonly known as: GLUCOPHAGE -XR TAKE 2 TABLETS(1000 MG) BY MOUTH DAILY WITH BREAKFAST What changed: See the new instructions.   Mounjaro  10 MG/0.5ML Pen Generic drug: tirzepatide  ADMINISTER 10 MG UNDER THE SKIN ONCE A WEEK   pantoprazole  40 MG tablet Commonly known as: PROTONIX  Take 1 tablet (40 mg total) by  mouth daily. What changed: when to take this   simvastatin  40 MG tablet Commonly known as: ZOCOR  TAKE 1 TABLET(40 MG) BY MOUTH AT BEDTIME What changed: See the new instructions.   traMADol  50 MG tablet Commonly known as: ULTRAM  Take 1-2 tablets (50-100 mg total) by mouth every 6 (six) hours as needed for moderate pain (pain score 4-6).   Vitamin D  50 MCG (2000 UT) Caps Take 2,000 Units by mouth daily at 12 noon.          OBJECTIVE:   PHYSICAL EXAM: VS: BP 118/60 (BP Location: Left Arm, Patient Position: Sitting, Cuff Size: Normal)   Pulse 79   Ht 5' 4 (1.626 m)   Wt 151 lb 12.8 oz (68.9 kg)   SpO2 97%   BMI 26.06 kg/m   EXAM: General: Pt appears well and is in NAD  Neck: General: Supple without adenopathy. Thyroid : Thyroid  size normal.  No goiter or nodules appreciated.   Lungs: Clear with good BS bilat   Heart: Auscultation: RRR.  Abdomen:  soft, nontender  Extremities:  BL LE: Trace right pretibial edema normal  Mental Status: Judgment, insight: Intact Orientation: Oriented to time, place, and person Mood and affect: No depression, anxiety, or agitation     DATA REVIEWED:   Latest Reference Range & Units 05/12/23 12:15  PTH, Intact 16 - 77 pg/mL 66    Latest Reference Range & Units 06/29/23 09:57  Calcium, 24H Urine mg/24 h 304 (H)  Creatinine, 24H Ur 0.50 - 2.15 g/24 h 0.93     Latest Reference Range & Units 02/15/24 09:33  Sodium 135 - 145 mmol/L 140  Potassium 3.5 - 5.1 mmol/L 5.2 (H)  Chloride 98 - 111 mmol/L 107  CO2 22 - 32 mmol/L 25  Glucose 70 - 99 mg/dL 893 (H)  Mean Plasma Glucose mg/dL 896  BUN 8 - 23 mg/dL 17  Creatinine 9.55 - 8.99 mg/dL 9.43  Calcium 8.9 - 89.6 mg/dL 88.8 (H)  Anion gap 5 - 15  8  GFR, Estimated >60 mL/min >60    ASSESSMENT / PLAN / RECOMMENDATIONS:   S/P Parathyroidectomy   - Corrected serum calcium is 10.74 mg/dL, ionized calcium is elevated - Pt has not met criteria for surgical intervention thus far   -Last DXA scan 12/2021-osteopenia -The patient has been diagnosed with stress fractures, which are NOT generally considered fragility fractures    Recommendations: Encouraged hydration Avoid OTC calcium supplements Maintain 2-3 servings of calcium a day    2.  Vitamin D  insufficiency:  -Resolved   medication Continue vitamin D3 2000 IU daily  3. Arthralgia :  - Patient continues with knee pains and variable bone pain, I did explain to the patient that typically with parathyroidectomy patients noticed improvement in arthralgias.  I did encourage the patient to consider yoga or any  stretching exercises, she may advance to weightbearing exercises in the future  F/U in 6 months   Signed electronically by: Stefano Redgie Butts, MD  [facility] Endocrinology  [facility] [address] [place], [place] [address] Phone: [phone] FAX: [phone]      CC: Theophilus Andrews, Tully GRADE, MD [address] [place] [address] Phone: [phone]  Fax: [phone]   Return to Endocrinology clinic as below: Future Appointments  Date Time Provider Department Center  06/06/2024  3:45 PM [facility] [address]  06/27/2024  3:30 PM Theophilus Andrews Tully GRADE, MD [facility] [address]  07/06/2024  3:45 PM [facility] [address]

## 2024-05-11 ENCOUNTER — Ambulatory Visit: Payer: Self-pay | Admitting: Internal Medicine

## 2024-05-20 ENCOUNTER — Other Ambulatory Visit: Payer: Self-pay | Admitting: Internal Medicine

## 2024-05-20 DIAGNOSIS — E1169 Type 2 diabetes mellitus with other specified complication: Secondary | ICD-10-CM

## 2024-05-29 ENCOUNTER — Other Ambulatory Visit: Payer: Self-pay | Admitting: Internal Medicine

## 2024-05-29 DIAGNOSIS — E1169 Type 2 diabetes mellitus with other specified complication: Secondary | ICD-10-CM

## 2024-06-01 ENCOUNTER — Ambulatory Visit

## 2024-06-06 ENCOUNTER — Ambulatory Visit

## 2024-06-15 ENCOUNTER — Ambulatory Visit (INDEPENDENT_AMBULATORY_CARE_PROVIDER_SITE_OTHER): Admitting: *Deleted

## 2024-06-15 DIAGNOSIS — E538 Deficiency of other specified B group vitamins: Secondary | ICD-10-CM | POA: Diagnosis not present

## 2024-06-15 MED ORDER — CYANOCOBALAMIN 1000 MCG/ML IJ SOLN
1000.0000 ug | Freq: Once | INTRAMUSCULAR | Status: AC
  Administered 2024-06-15: 1000 ug via INTRAMUSCULAR

## 2024-06-15 NOTE — Progress Notes (Signed)
 Per orders of Dr. Ardyth Harps, injection of B12 given by Kern Reap. Patient tolerated injection well.

## 2024-06-20 LAB — HM MAMMOGRAPHY

## 2024-06-25 ENCOUNTER — Other Ambulatory Visit: Payer: Self-pay | Admitting: Internal Medicine

## 2024-06-27 ENCOUNTER — Encounter: Payer: Self-pay | Admitting: Internal Medicine

## 2024-06-27 ENCOUNTER — Ambulatory Visit (INDEPENDENT_AMBULATORY_CARE_PROVIDER_SITE_OTHER): Admitting: Internal Medicine

## 2024-06-27 VITALS — BP 110/70 | HR 75 | Temp 98.1°F | Ht 64.0 in | Wt 155.0 lb

## 2024-06-27 DIAGNOSIS — E559 Vitamin D deficiency, unspecified: Secondary | ICD-10-CM

## 2024-06-27 DIAGNOSIS — E210 Primary hyperparathyroidism: Secondary | ICD-10-CM

## 2024-06-27 DIAGNOSIS — E538 Deficiency of other specified B group vitamins: Secondary | ICD-10-CM | POA: Diagnosis not present

## 2024-06-27 DIAGNOSIS — Z0000 Encounter for general adult medical examination without abnormal findings: Secondary | ICD-10-CM

## 2024-06-27 DIAGNOSIS — E1169 Type 2 diabetes mellitus with other specified complication: Secondary | ICD-10-CM

## 2024-06-27 DIAGNOSIS — Z1211 Encounter for screening for malignant neoplasm of colon: Secondary | ICD-10-CM

## 2024-06-27 DIAGNOSIS — E119 Type 2 diabetes mellitus without complications: Secondary | ICD-10-CM

## 2024-06-27 DIAGNOSIS — Z780 Asymptomatic menopausal state: Secondary | ICD-10-CM

## 2024-06-27 DIAGNOSIS — E785 Hyperlipidemia, unspecified: Secondary | ICD-10-CM | POA: Diagnosis not present

## 2024-06-27 NOTE — Progress Notes (Signed)
 Established Patient Office Visit     CC/Reason for Visit: Annual preventive exam, diffuse muscle aches  HPI: Kathy Herman is a 78 y.o. female who is coming in today for the above mentioned reasons. Past Medical History is significant for: Type 2 diabetes, hyperlipidemia, GERD, vitamin D  and B12 deficiencies, hyperparathyroidism status post resection.  She has been having terrible muscle aches and cramps.  These are diffuse.  She has otherwise been doing well.  Has routine eye and dental care.  Is due for Cologuard and bone density.  Immunizations are up-to-date.   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/2018   Procedure: Revision right knee uni to total knee arthroplasty;  Surgeon: Melodi Lerner, MD;  Location: [facility];  Service: Orthopedics;  Laterality: Right;    MEDIAL PARTIAL KNEE REPLACEMENT Right 2018   PARATHYROID  EXPLORATION N/A 02/22/2024   Procedure: EXPLORATION, PARATHYROID ;  Surgeon: Eletha Boas, MD;  Location: [facility];  Service: General;  Laterality: N/A;  NECK EXPLORATION WITH PARATHYROIDECTOMY   PARATHYROIDECTOMY  02/22/2024   Procedure: LEFT SUPERIOR PARATHYROIDECTOMY;  Surgeon: Eletha Boas, MD;  Location: [facility];  Service: General;;   TUBAL LIGATION      Social History:  reports that she has never smoked. She has never used smokeless tobacco. She reports current alcohol use. She reports that she does  not currently use drugs.  Allergies: Allergies  Allergen Reactions   Erythromycin Nausea Only    Family History:  Family History  Problem Relation Age of Onset   Memory loss Mother    Kidney Stones Father    Congestive Heart Failure Father      Current Outpatient Medications:    Aspirin  81 MG CAPS, Take 81 mg by mouth daily at 12 noon., Disp: , Rfl:    Cholecalciferol (VITAMIN D ) 50 MCG (2000 UT) CAPS, Take 1,000 Units by mouth daily at 12 noon., Disp: , Rfl:    cyanocobalamin  (VITAMIN B12) 1000 MCG/ML injection, Inject 1,000 mcg into the muscle every 30 (thirty) days., Disp: , Rfl:    finasteride  (PROSCAR ) 5 MG tablet, Take 2.5 mg by mouth daily., Disp: , Rfl:    Magnesium  250 MG CAPS, Take by mouth., Disp: , Rfl:    metFORMIN  (GLUCOPHAGE -XR) 500 MG 24 hr tablet, TAKE 2 TABLETS(1000 MG) BY MOUTH DAILY WITH BREAKFAST, Disp: 180 tablet, Rfl: 1   MOUNJARO  10 MG/0.5ML Pen, ADMINISTER 10 MG UNDER THE SKIN ONCE A WEEK, Disp: 6 mL, Rfl: 0   pantoprazole  (PROTONIX ) 40 MG tablet, Take 1 tablet (40 mg total) by mouth daily. (Patient taking differently: Take 40 mg by mouth every other day.), Disp: 90 tablet, Rfl: 1   simvastatin  (ZOCOR ) 40 MG tablet, TAKE 1 TABLET(40 MG) BY MOUTH AT BEDTIME, Disp: 90 tablet, Rfl: 0  Review of Systems:  Negative unless indicated in HPI.   Physical Exam: Vitals:   06/27/24 1530  BP: 110/70  Pulse: 75  Temp: 98.1 F (36.7 C)  TempSrc: Oral  SpO2: 94%  Weight: 155 lb (70.3 kg)  Height: 5' 4 (1.626 m)    Body mass index is 26.61 kg/m.   Physical Exam Vitals reviewed.  Constitutional:      General: She is not in acute distress.    Appearance: Normal appearance. She is obese. She is not ill-appearing, toxic-appearing or diaphoretic.  HENT:     Head: Normocephalic.     Right Ear: Tympanic membrane, ear canal and external ear normal. There is no impacted cerumen.     Left Ear: Tympanic membrane, ear canal and external ear normal. There is no  impacted cerumen.     Nose: Nose normal.     Mouth/Throat:     Mouth: Mucous membranes are moist.     Pharynx: Oropharynx is clear. No oropharyngeal exudate or posterior oropharyngeal erythema.  Eyes:     General: No scleral icterus.       Right eye: No discharge.        Left eye: No discharge.     Conjunctiva/sclera: Conjunctivae normal.     Pupils: Pupils are equal, round, and reactive to light.  Neck:     Vascular: No carotid bruit.  Cardiovascular:     Rate and Rhythm: Normal rate and regular rhythm.     Pulses: Normal pulses.     Heart sounds: Normal heart sounds.  Pulmonary:     Effort: Pulmonary effort is normal. No respiratory distress.     Breath sounds: Normal breath sounds.  Abdominal:     General: Abdomen is flat. Bowel sounds are normal.     Palpations: Abdomen is soft.  Musculoskeletal:        General: Normal range of motion.     Cervical back: Normal range of motion.  Skin:    General: Skin is warm and dry.  Neurological:     General: No focal deficit present.     Mental Status: She is alert and oriented to person, place, and time. Mental status is at baseline.  Psychiatric:        Mood and Affect: Mood normal.        Behavior: Behavior normal.        Thought Content: Thought content normal.        Judgment: Judgment normal.      Impression and Plan:  Encounter for preventive health examination  Vitamin B12 deficiency -     Vitamin B12; Future  Controlled type 2 diabetes mellitus without complication, without long-term current use of insulin  (HCC) -     Hemoglobin A1c; Future -     CBC with Differential/Platelet; Future -     Comprehensive metabolic panel with GFR; Future -     Microalbumin / creatinine urine ratio; Future -     Magnesium ; Future  Hyperlipidemia associated with type 2 diabetes mellitus (HCC) -     Lipid panel; Future  Primary hyperparathyroidism -     TSH; Future  Hypercalcemia  Vitamin D  deficiency -     VITAMIN D  25  Hydroxy (Vit-D Deficiency, Fractures); Future  B12 deficiency  Screening for colon cancer -     Cologuard  Postmenopausal estrogen deficiency -     DG Bone Density; Future   -Recommend routine eye and dental care. -Healthy lifestyle discussed in detail. -Labs to be updated today. -Prostate cancer screening: N/A Health Maintenance  Topic Date Due   COVID-19 Vaccine (7 -  2025-26 season) 04/04/2024   Hemoglobin A1C  08/17/2024   Eye exam for diabetics  11/22/2024   Complete foot exam   12/14/2024   Yearly kidney health urinalysis for diabetes  03/03/2025   Yearly kidney function blood test for diabetes  05/10/2025   DTaP/Tdap/Td vaccine (2 - Td or Tdap) 05/11/2025   Pneumococcal Vaccine for age over 42  Completed   Flu Shot  Completed   Osteoporosis screening with Bone Density Scan  Completed   Hepatitis C Screening  Completed   Zoster (Shingles) Vaccine  Completed   Meningitis B Vaccine  Aged Out   Breast Cancer Screening  Discontinued   Colon Cancer Screening  Discontinued     - Given cramps and muscle aches will check electrolytes, have also advised discontinuing statin for 2 weeks to see if this has any effect.    Tully Theophilus Andrews, MD [facility]

## 2024-06-28 LAB — MICROALBUMIN / CREATININE URINE RATIO
Creatinine,U: 80.3 mg/dL
Microalb Creat Ratio: UNDETERMINED mg/g (ref 0.0–30.0)
Microalb, Ur: <0.7 mg/dL

## 2024-06-28 LAB — LIPID PANEL
Cholesterol: 126 mg/dL (ref 0–200)
HDL: 52.7 mg/dL (ref >39.00)
LDL Cholesterol: 45 mg/dL (ref 0–99)
NonHDL: 73.62
Total CHOL/HDL Ratio: 2
Triglycerides: 145 mg/dL (ref 0.0–149.0)
VLDL: 29 mg/dL (ref 0.0–40.0)

## 2024-06-28 LAB — VITAMIN D 25 HYDROXY (VIT D DEFICIENCY, FRACTURES): VITD: 30.41 ng/mL (ref 30.00–100.00)

## 2024-06-28 LAB — CBC WITH DIFFERENTIAL/PLATELET
Basophils Absolute: 0.1 K/uL (ref 0.0–0.1)
Basophils Relative: 1.9 % (ref 0.0–3.0)
Eosinophils Absolute: 0.2 K/uL (ref 0.0–0.7)
Eosinophils Relative: 2.9 % (ref 0.0–5.0)
HCT: 36.6 % (ref 36.0–46.0)
Hemoglobin: 12.5 g/dL (ref 12.0–15.0)
Lymphocytes Relative: 29.6 % (ref 12.0–46.0)
Lymphs Abs: 1.9 K/uL (ref 0.7–4.0)
MCHC: 34 g/dL (ref 30.0–36.0)
MCV: 86.7 fl (ref 78.0–100.0)
Monocytes Absolute: 0.4 K/uL (ref 0.1–1.0)
Monocytes Relative: 7 % (ref 3.0–12.0)
Neutro Abs: 3.7 K/uL (ref 1.4–7.7)
Neutrophils Relative %: 58.6 % (ref 43.0–77.0)
Platelets: 274 K/uL (ref 150.0–400.0)
RBC: 4.22 Mil/uL (ref 3.87–5.11)
RDW: 13.5 % (ref 11.5–15.5)
WBC: 6.4 K/uL (ref 4.0–10.5)

## 2024-06-28 LAB — COMPREHENSIVE METABOLIC PANEL WITH GFR
ALT: 15 U/L (ref 0–35)
AST: 17 U/L (ref 0–37)
Albumin: 4.3 g/dL (ref 3.5–5.2)
Alkaline Phosphatase: 67 U/L (ref 39–117)
BUN: 15 mg/dL (ref 6–23)
CO2: 27 meq/L (ref 19–32)
Calcium: 10 mg/dL (ref 8.4–10.5)
Chloride: 108 meq/L (ref 96–112)
Creatinine, Ser: 0.71 mg/dL (ref 0.40–1.20)
GFR: 81.53 mL/min (ref >60.00)
Glucose, Bld: 81 mg/dL (ref 70–99)
Potassium: 4.1 meq/L (ref 3.5–5.1)
Sodium: 140 meq/L (ref 135–145)
Total Bilirubin: 0.4 mg/dL (ref 0.2–1.2)
Total Protein: 6.7 g/dL (ref 6.0–8.3)

## 2024-06-28 LAB — TSH: TSH: 2.41 u[IU]/mL (ref 0.35–5.50)

## 2024-06-28 LAB — MAGNESIUM: Magnesium: 2 mg/dL (ref 1.5–2.5)

## 2024-06-28 LAB — VITAMIN B12: Vitamin B-12: 283 pg/mL (ref 211–911)

## 2024-06-28 LAB — HEMOGLOBIN A1C: Hgb A1c MFr Bld: 5.4 % (ref 4.6–6.5)

## 2024-07-04 ENCOUNTER — Ambulatory Visit: Payer: Self-pay | Admitting: Internal Medicine

## 2024-07-06 ENCOUNTER — Ambulatory Visit

## 2024-07-08 LAB — COLOGUARD: COLOGUARD: NEGATIVE

## 2024-07-15 ENCOUNTER — Ambulatory Visit

## 2024-07-15 DIAGNOSIS — E538 Deficiency of other specified B group vitamins: Secondary | ICD-10-CM

## 2024-07-15 MED ORDER — CYANOCOBALAMIN 1000 MCG/ML IJ SOLN
1000.0000 ug | Freq: Once | INTRAMUSCULAR | Status: AC
  Administered 2024-07-15: 1000 ug via INTRAMUSCULAR

## 2024-07-15 NOTE — Progress Notes (Signed)
 Patient is in office today for a nurse visit for B12 Injection. Patient Injection was given in the  Right deltoid. Patient tolerated injection well.

## 2024-07-18 ENCOUNTER — Other Ambulatory Visit: Payer: Self-pay | Admitting: Internal Medicine

## 2024-07-18 DIAGNOSIS — E119 Type 2 diabetes mellitus without complications: Secondary | ICD-10-CM

## 2024-07-26 ENCOUNTER — Other Ambulatory Visit: Payer: Self-pay | Admitting: Internal Medicine

## 2024-07-26 DIAGNOSIS — K219 Gastro-esophageal reflux disease without esophagitis: Secondary | ICD-10-CM

## 2024-08-22 ENCOUNTER — Ambulatory Visit: Admitting: *Deleted

## 2024-08-22 DIAGNOSIS — E538 Deficiency of other specified B group vitamins: Secondary | ICD-10-CM | POA: Diagnosis not present

## 2024-08-22 MED ORDER — CYANOCOBALAMIN 1000 MCG/ML IJ SOLN
1000.0000 ug | Freq: Once | INTRAMUSCULAR | Status: AC
  Administered 2024-08-22: 1000 ug via INTRAMUSCULAR

## 2024-08-22 NOTE — Progress Notes (Signed)
 Per orders of Dr. Ardyth Harps, injection of B12 given by Kern Reap. Patient tolerated injection well.

## 2024-08-25 ENCOUNTER — Other Ambulatory Visit: Payer: Self-pay | Admitting: Internal Medicine

## 2024-08-25 DIAGNOSIS — E1169 Type 2 diabetes mellitus with other specified complication: Secondary | ICD-10-CM

## 2024-09-22 ENCOUNTER — Ambulatory Visit
